# Patient Record
Sex: Female | Born: 1940
Health system: Southern US, Community
[De-identification: ages and names within clinical notes are randomized; demographics above are authoritative.]

## PROBLEM LIST (undated history)

## (undated) DIAGNOSIS — J45909 Unspecified asthma, uncomplicated: Secondary | ICD-10-CM

## (undated) DIAGNOSIS — T4145XA Adverse effect of unspecified anesthetic, initial encounter: Secondary | ICD-10-CM

## (undated) DIAGNOSIS — T8859XA Other complications of anesthesia, initial encounter: Secondary | ICD-10-CM

## (undated) DIAGNOSIS — E669 Obesity, unspecified: Secondary | ICD-10-CM

## (undated) DIAGNOSIS — M1990 Unspecified osteoarthritis, unspecified site: Secondary | ICD-10-CM

## (undated) DIAGNOSIS — T7840XA Allergy, unspecified, initial encounter: Secondary | ICD-10-CM

## (undated) DIAGNOSIS — E785 Hyperlipidemia, unspecified: Secondary | ICD-10-CM

## (undated) DIAGNOSIS — C801 Malignant (primary) neoplasm, unspecified: Secondary | ICD-10-CM

## (undated) DIAGNOSIS — I10 Essential (primary) hypertension: Secondary | ICD-10-CM

## (undated) DIAGNOSIS — R519 Headache, unspecified: Secondary | ICD-10-CM

## (undated) DIAGNOSIS — E049 Nontoxic goiter, unspecified: Secondary | ICD-10-CM

## (undated) DIAGNOSIS — M8580 Other specified disorders of bone density and structure, unspecified site: Secondary | ICD-10-CM

## (undated) HISTORY — PX: WISDOM TOOTH EXTRACTION: SHX21

## (undated) HISTORY — DX: Hyperlipidemia, unspecified: E78.5

## (undated) HISTORY — DX: Obesity, unspecified: E66.9

## (undated) HISTORY — PX: COLONOSCOPY: SHX174

## (undated) HISTORY — PX: EYE SURGERY: SHX253

## (undated) HISTORY — DX: Unspecified asthma, uncomplicated: J45.909

## (undated) HISTORY — DX: Malignant (primary) neoplasm, unspecified: C80.1

## (undated) HISTORY — DX: Other specified disorders of bone density and structure, unspecified site: M85.80

## (undated) HISTORY — PX: DENTAL SURGERY: SHX609

## (undated) HISTORY — DX: Allergy, unspecified, initial encounter: T78.40XA

## (undated) HISTORY — DX: Essential (primary) hypertension: I10

## (undated) HISTORY — PX: ABDOMINAL HYSTERECTOMY: SHX81

---
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
## 2000-08-11 ENCOUNTER — Encounter: Admission: RE | Admit: 2000-08-11 | Discharge: 2000-08-11 | Payer: Self-pay | Admitting: Internal Medicine

## 2000-08-11 ENCOUNTER — Encounter: Payer: Self-pay | Admitting: Internal Medicine

## 2000-09-15 ENCOUNTER — Encounter: Payer: Self-pay | Admitting: Internal Medicine

## 2000-09-15 ENCOUNTER — Encounter: Admission: RE | Admit: 2000-09-15 | Discharge: 2000-09-15 | Payer: Self-pay | Admitting: Internal Medicine

## 2001-11-29 HISTORY — PX: HAMMER TOE SURGERY: SHX385

## 2004-05-15 ENCOUNTER — Ambulatory Visit (HOSPITAL_COMMUNITY): Admission: RE | Admit: 2004-05-15 | Discharge: 2004-05-15 | Payer: Self-pay | Admitting: Gastroenterology

## 2008-12-12 ENCOUNTER — Ambulatory Visit: Payer: Self-pay | Admitting: Internal Medicine

## 2009-01-24 ENCOUNTER — Ambulatory Visit: Payer: Self-pay | Admitting: Internal Medicine

## 2009-03-06 ENCOUNTER — Ambulatory Visit: Payer: Self-pay | Admitting: Internal Medicine

## 2009-07-04 ENCOUNTER — Ambulatory Visit: Payer: Self-pay | Admitting: Internal Medicine

## 2009-12-15 ENCOUNTER — Ambulatory Visit: Payer: Self-pay | Admitting: Internal Medicine

## 2010-02-16 ENCOUNTER — Ambulatory Visit: Payer: Self-pay | Admitting: Internal Medicine

## 2010-07-12 ENCOUNTER — Ambulatory Visit: Payer: Self-pay | Admitting: Internal Medicine

## 2010-07-12 LAB — HM MAMMOGRAPHY

## 2010-10-08 LAB — HM PAP SMEAR

## 2011-01-15 ENCOUNTER — Ambulatory Visit
Admission: RE | Admit: 2011-01-15 | Discharge: 2011-01-15 | Payer: Self-pay | Source: Home / Self Care | Attending: Internal Medicine | Admitting: Internal Medicine

## 2011-08-26 ENCOUNTER — Encounter: Payer: Self-pay | Admitting: Internal Medicine

## 2011-08-26 ENCOUNTER — Ambulatory Visit (INDEPENDENT_AMBULATORY_CARE_PROVIDER_SITE_OTHER): Payer: Medicare Other | Admitting: Internal Medicine

## 2011-08-26 VITALS — BP 152/80 | HR 100 | Temp 98.2°F | Ht 63.5 in | Wt 186.0 lb

## 2011-08-26 DIAGNOSIS — I10 Essential (primary) hypertension: Secondary | ICD-10-CM

## 2011-08-26 DIAGNOSIS — Z0000 Encounter for general adult medical examination without abnormal findings: Secondary | ICD-10-CM

## 2011-08-26 DIAGNOSIS — J069 Acute upper respiratory infection, unspecified: Secondary | ICD-10-CM

## 2011-08-26 DIAGNOSIS — E049 Nontoxic goiter, unspecified: Secondary | ICD-10-CM

## 2011-08-26 DIAGNOSIS — N390 Urinary tract infection, site not specified: Secondary | ICD-10-CM

## 2011-08-26 DIAGNOSIS — R42 Dizziness and giddiness: Secondary | ICD-10-CM

## 2011-08-26 LAB — POCT URINALYSIS DIPSTICK
Bilirubin, UA: NEGATIVE
Blood, UA: NEGATIVE
Glucose, UA: NEGATIVE
Ketones, UA: NEGATIVE
Leukocytes, UA: NEGATIVE
Nitrite, UA: NEGATIVE
Protein, UA: NEGATIVE
Spec Grav, UA: 1.01
Urobilinogen, UA: NEGATIVE
pH, UA: 5

## 2011-08-26 NOTE — Progress Notes (Signed)
  Subjective:    [name], female    DOB: May 08, 1941, 70 y.o.   MRN: [id number]  HPI patient originally scheduled to come in tomorrow for six-month recheck. However she came back from [place] by trying early yesterday morning and has developed a respiratory infection. Has postnasal drip and some dizziness. Did not take antihypertensive medication today and blood pressure is elevated at 152/80. Feels a bit nauseated. She stated [place] several weeks. Went to [facility] in [place],  on July 26. She was found to have a urinary tract infection and was treated with Cipro. Apparently was a bit tachycardic and they were concerned about possible pulmonary embolism. She had a CT of the chest because of an elevated D- dimer but CT was negative for pulmonary embolism. However she was noted to have an enlarged thyroid gland. She's here about all these problems today. Obtain urine specimen today which  is within normal limits with no evidence of infection.    Review of Systems     Objective:   Physical Exam HEENT exam: Pharynx slightly injected. TMs obscured by cerumen. Anterior cervical nodes bilaterally. Neck is supple. Slight thyromegaly. Chest clear.        Assessment & Plan:  Hypertension  Upper respiratory infection  Recent urinary tract infection-July 26  Vertigo  Goiter  Plan: Patient will have a thyroid ultrasound to assess possible enlarged thyroid gland. Have treated URI with Zithromax Z-PAK take as directed. Vertigo to be treated with Antivert over-the-counter 3 times a day as needed. See patient again in 2 weeks to discuss thyroid ultrasound.

## 2011-08-27 ENCOUNTER — Ambulatory Visit: Payer: Self-pay | Admitting: Internal Medicine

## 2011-08-28 ENCOUNTER — Other Ambulatory Visit: Payer: Self-pay

## 2011-09-04 ENCOUNTER — Encounter: Payer: Self-pay | Admitting: Internal Medicine

## 2011-09-05 ENCOUNTER — Ambulatory Visit
Admission: RE | Admit: 2011-09-05 | Discharge: 2011-09-05 | Disposition: A | Discharge status: Home / Self Care | Payer: Medicare Other | Source: Ambulatory Visit | Attending: Internal Medicine | Admitting: Internal Medicine

## 2011-09-05 DIAGNOSIS — E049 Nontoxic goiter, unspecified: Secondary | ICD-10-CM

## 2011-09-09 ENCOUNTER — Encounter: Payer: Self-pay | Admitting: Internal Medicine

## 2011-09-09 ENCOUNTER — Ambulatory Visit (INDEPENDENT_AMBULATORY_CARE_PROVIDER_SITE_OTHER): Payer: Medicare Other | Admitting: Internal Medicine

## 2011-09-09 ENCOUNTER — Telehealth: Payer: Self-pay | Admitting: *Deleted

## 2011-09-09 DIAGNOSIS — J309 Allergic rhinitis, unspecified: Secondary | ICD-10-CM

## 2011-09-09 DIAGNOSIS — E041 Nontoxic single thyroid nodule: Secondary | ICD-10-CM

## 2011-09-09 DIAGNOSIS — E049 Nontoxic goiter, unspecified: Secondary | ICD-10-CM

## 2011-09-09 DIAGNOSIS — E669 Obesity, unspecified: Secondary | ICD-10-CM

## 2011-09-09 DIAGNOSIS — Z8541 Personal history of malignant neoplasm of cervix uteri: Secondary | ICD-10-CM

## 2011-09-09 DIAGNOSIS — E010 Iodine-deficiency related diffuse (endemic) goiter: Secondary | ICD-10-CM

## 2011-09-09 NOTE — Telephone Encounter (Signed)
Pt scheduled on 9/25 at 4:15.  Pt notified.

## 2011-09-24 ENCOUNTER — Other Ambulatory Visit (HOSPITAL_COMMUNITY)
Admission: RE | Admit: 2011-09-24 | Discharge: 2011-09-24 | Disposition: A | Discharge status: Home / Self Care | Payer: Medicare Other | Source: Ambulatory Visit | Attending: Interventional Radiology | Admitting: Interventional Radiology

## 2011-09-24 ENCOUNTER — Ambulatory Visit
Admission: RE | Admit: 2011-09-24 | Discharge: 2011-09-24 | Disposition: A | Discharge status: Home / Self Care | Payer: Medicare Other | Source: Ambulatory Visit | Attending: Internal Medicine | Admitting: Internal Medicine

## 2011-09-24 DIAGNOSIS — E049 Nontoxic goiter, unspecified: Secondary | ICD-10-CM | POA: Insufficient documentation

## 2011-09-24 DIAGNOSIS — E041 Nontoxic single thyroid nodule: Secondary | ICD-10-CM

## 2011-09-25 DIAGNOSIS — E010 Iodine-deficiency related diffuse (endemic) goiter: Secondary | ICD-10-CM | POA: Insufficient documentation

## 2011-09-25 DIAGNOSIS — E041 Nontoxic single thyroid nodule: Secondary | ICD-10-CM | POA: Insufficient documentation

## 2011-09-25 DIAGNOSIS — E669 Obesity, unspecified: Secondary | ICD-10-CM | POA: Insufficient documentation

## 2011-09-25 DIAGNOSIS — J309 Allergic rhinitis, unspecified: Secondary | ICD-10-CM | POA: Insufficient documentation

## 2011-09-25 DIAGNOSIS — Z8541 Personal history of malignant neoplasm of cervix uteri: Secondary | ICD-10-CM | POA: Insufficient documentation

## 2011-09-25 NOTE — Progress Notes (Signed)
  Subjective:    [name], female    DOB: September 27, 1941, 70 y.o.   MRN: [id number]  HPI patient was visiting her mother in [place] and had some chest discomfort and went to the emergency room. She had a CT scan of the chest to rule out pulmonary embolus. Pulmonary a bolus was not found but she was found to have an enlarged thyroid gland with? Nodule on the left. She is here now for further evaluation. Mother is not doing well and she's very anxious about her state of health. Mother has some dementia. Patient's husband also has dementia and she has a hard time caring for him as well. Patient is a retired Runner, broadcasting/film/video with history of hypertension, obesity, allergic rhinitis, osteopenia, vitamin D deficiency, hyperlipidemia. Had hysterectomy without oophorectomy for cervical cancer and counseling in 1977. Right hammertoe surgery December 2002. Colonoscopy 2005.    Review of Systems     Objective:   Physical Exam patient has thyromegaly; neck supple; chest clear.        Assessment & Plan:  Recent ultrasound showed significant nodule left thyroid occupy much of the left lobe of the thyroid.  Bilateral thyromegaly with normal TSH  Patient is to have biopsy of thyroid nodule in the near future. She wants to wait until late September to do this

## 2011-09-26 ENCOUNTER — Telehealth: Payer: Self-pay

## 2011-09-26 NOTE — Telephone Encounter (Signed)
Patient informed that her thyroid needle biopsy was within normal limits.Negative for cancer. Will refer her to endocrinologist for evaluation

## 2011-09-27 ENCOUNTER — Encounter: Payer: Self-pay | Admitting: Internal Medicine

## 2011-09-27 NOTE — Progress Notes (Signed)
Patient informed of thyroid biopsy results. Per Dr. Lenord Fellers, will refer to an endocrinologist

## 2011-10-02 IMAGING — US US SOFT TISSUE HEAD/NECK
1 series · 14 of 25 positions shown · non-contrast
Comparison: None.

CLINICAL DATA: Enlarged thyroid on physical exam

THYROID ULTRASOUND
TECHNIQUE: Ultrasound examination of the thyroid gland and adjacent
soft tissues was performed.

[Series 1: us soft tissue head/neck · 0.06mm/px · 14 of 37 slices shown]
[im 1/37]
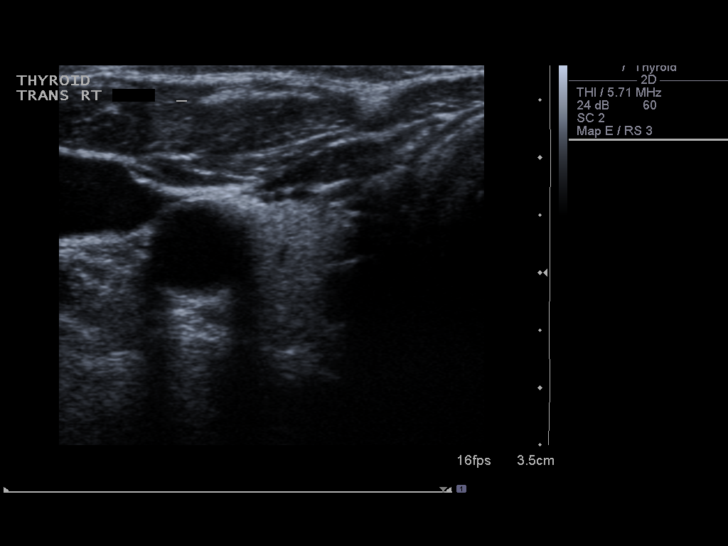
[im 4/37]
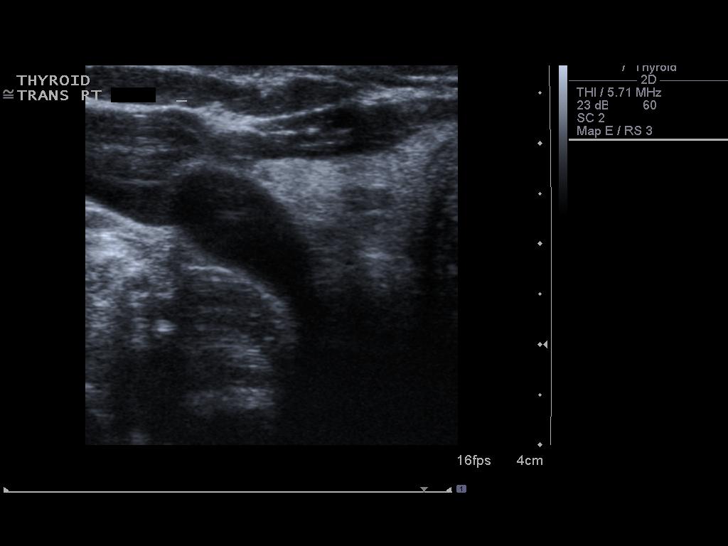
[im 7/37]
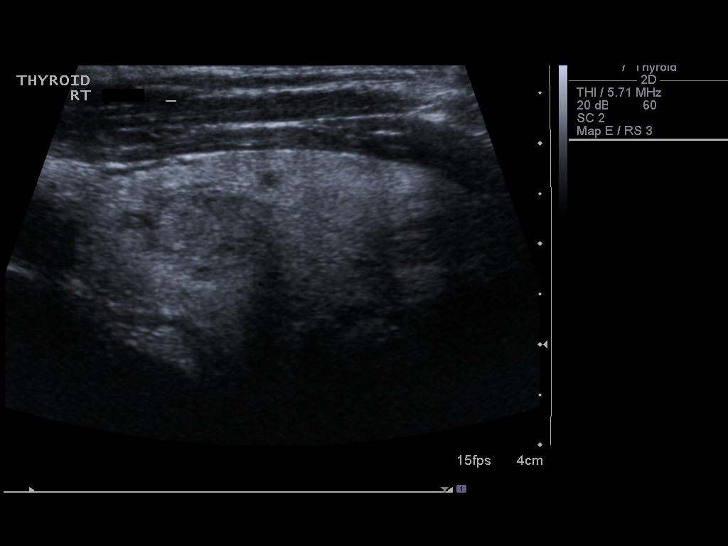
[im 10/37]
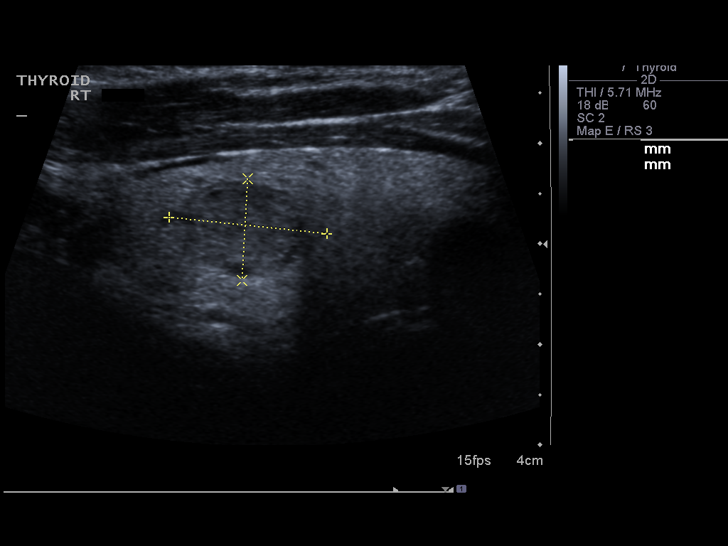
[im 13/37]
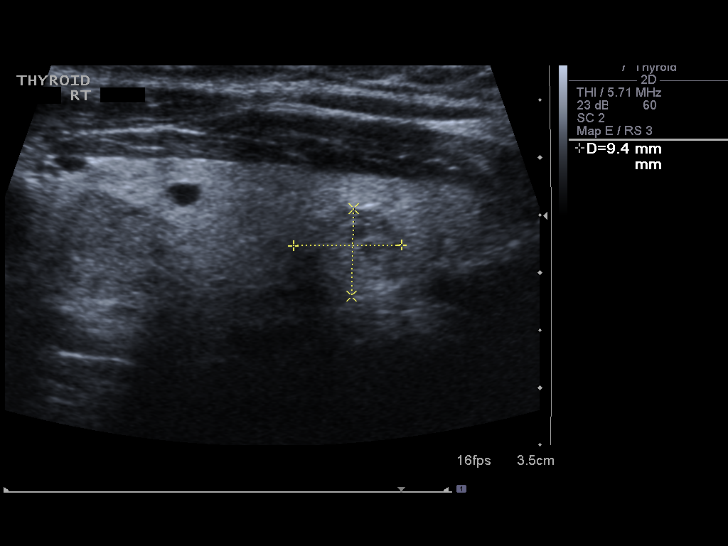
[im 14/37]
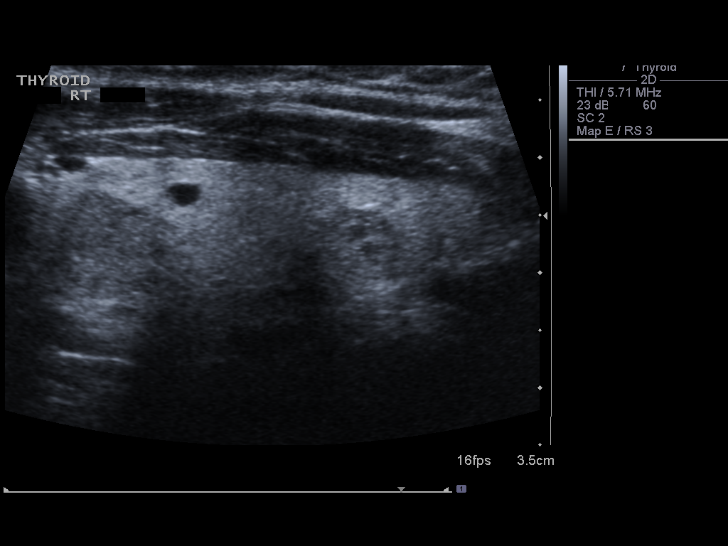
[im 17/37]
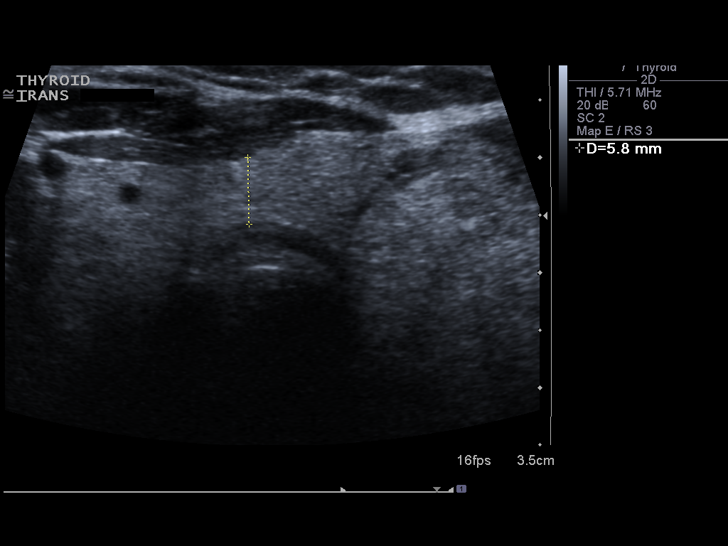
[im 20/37]
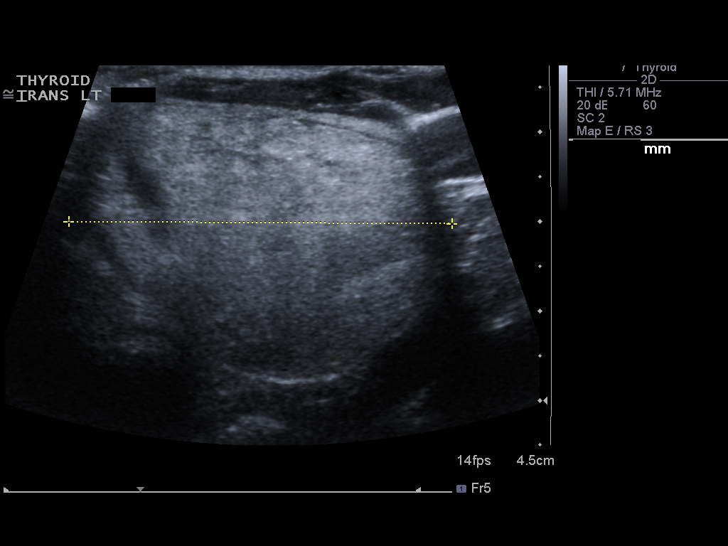
[im 23/37]
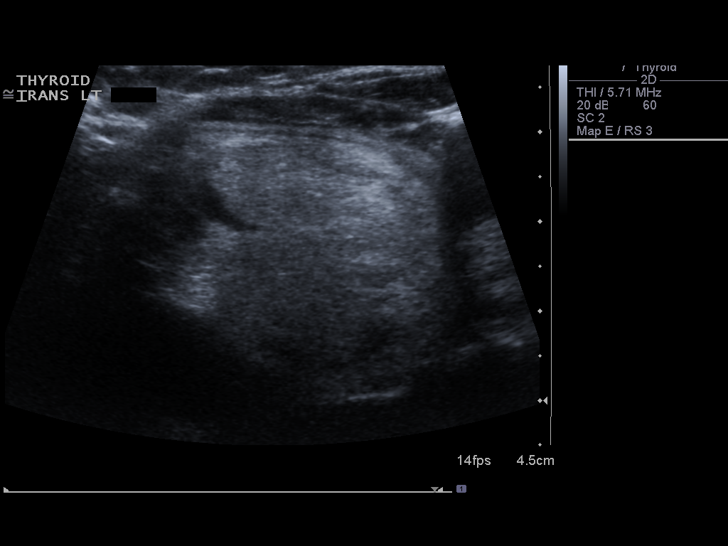
[im 25/37]
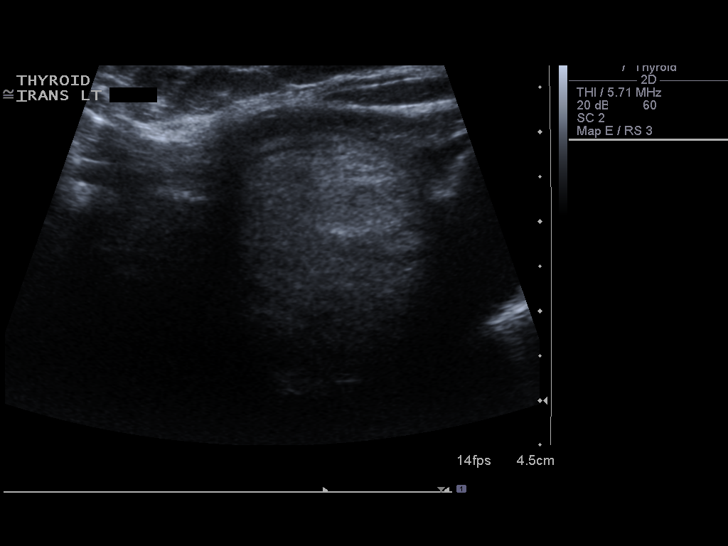
[im 28/37]
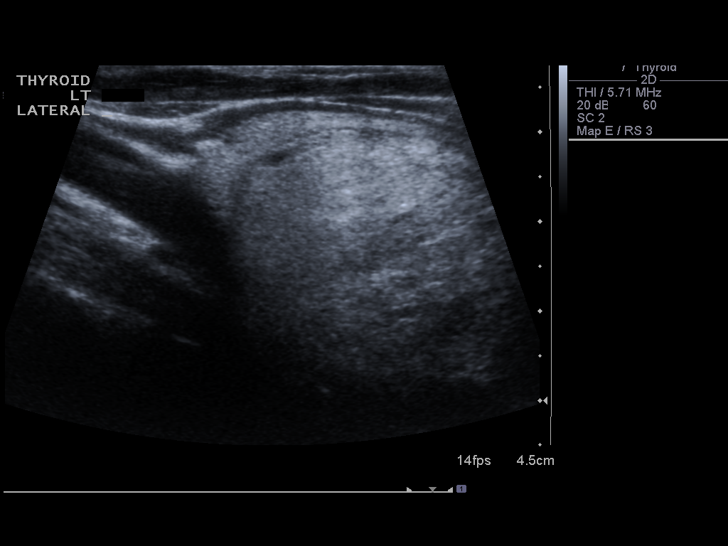
[im 31/37]
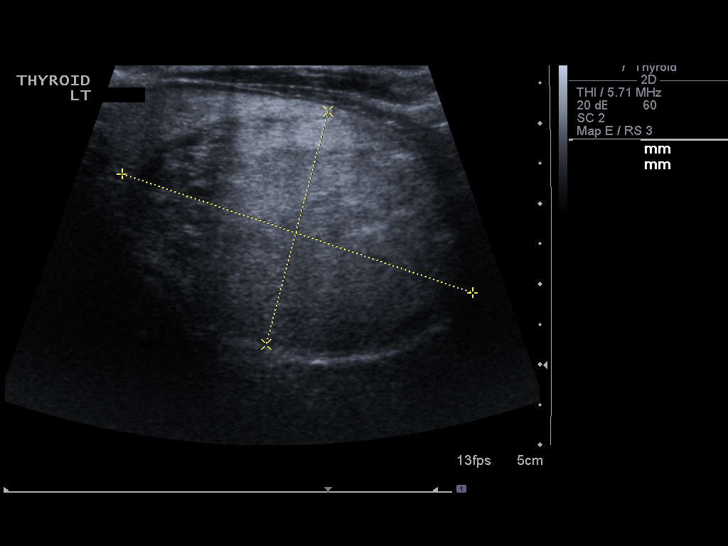
[im 34/37]
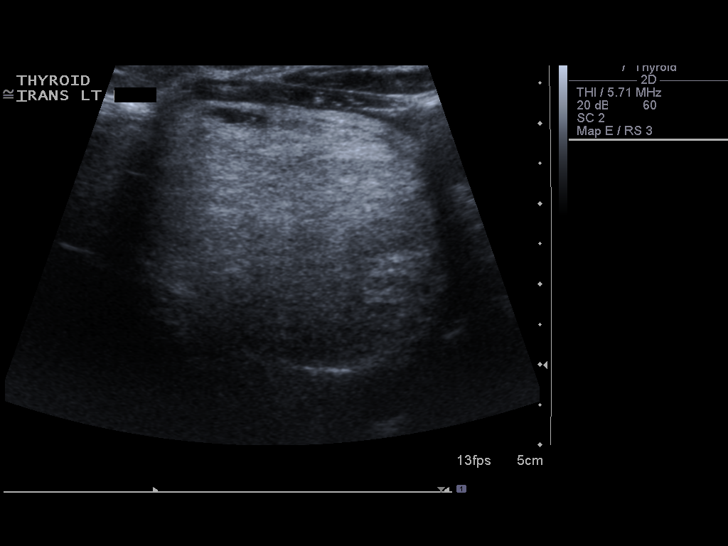
[im 37/37]
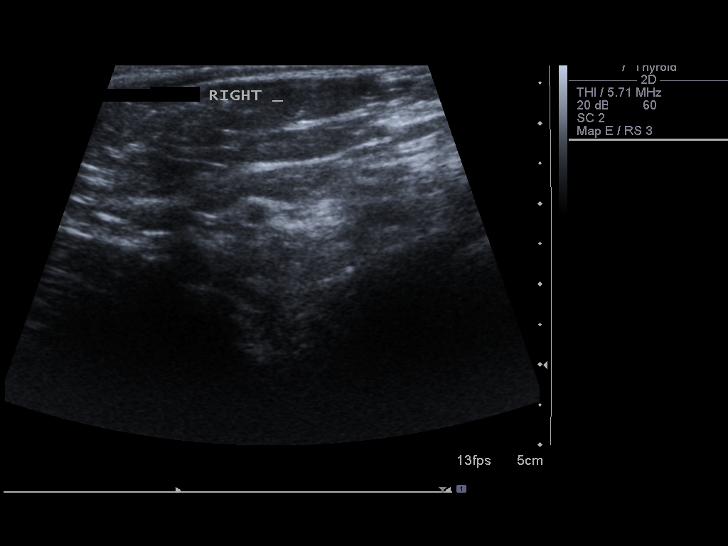

[14 of 25 positions shown; findings below may reference images not displayed]

FINDINGS: Right thyroid lobe:  4.6 x 2.0 x 1.7 cm.
Left thyroid lobe:  6.2 x 3.6 x 4.3 cm.
Isthmus:  6 mm in thickness.

Focal nodules:  The echogenicity of the thyroid gland is somewhat
inhomogeneous.  A dominant solid nodule occupies much of the left
lobe measuring 4.6 x 3.0 x 3.6 cm.  Two solid nodules are noted on
the right, the larger measuring 1.6 x 1.0 x 0.8 cm, with the
smaller in the lower pole measuring 0.9 x 0.8 x 1.0 cm.

Lymphadenopathy:  None visualized.
IMPRESSION: There are thyroid nodules present bilaterally, with the dominant
solid nodule occupying much of the left lobe measuring 4.6 x 3.0 x
3.6 cm.  Consider biopsy of this dominant nodule.

## 2012-01-29 DIAGNOSIS — Q828 Other specified congenital malformations of skin: Secondary | ICD-10-CM | POA: Diagnosis not present

## 2012-01-29 DIAGNOSIS — B351 Tinea unguium: Secondary | ICD-10-CM | POA: Diagnosis not present

## 2012-01-29 DIAGNOSIS — M79609 Pain in unspecified limb: Secondary | ICD-10-CM | POA: Diagnosis not present

## 2012-02-10 ENCOUNTER — Telehealth: Payer: Self-pay | Admitting: Internal Medicine

## 2012-02-10 NOTE — Telephone Encounter (Signed)
[facility] is the most likely choice. Dr. Merlene Laughter with [facility] is a geriatrician but he may not be seeing new patients.

## 2012-02-10 NOTE — Telephone Encounter (Signed)
Left message on patient's home phone advising her to try [facility] or Dr. Merlene Laughter with [facility]

## 2012-02-19 DIAGNOSIS — D235 Other benign neoplasm of skin of trunk: Secondary | ICD-10-CM | POA: Diagnosis not present

## 2012-02-19 DIAGNOSIS — L820 Inflamed seborrheic keratosis: Secondary | ICD-10-CM | POA: Diagnosis not present

## 2012-02-19 DIAGNOSIS — L819 Disorder of pigmentation, unspecified: Secondary | ICD-10-CM | POA: Diagnosis not present

## 2012-03-23 ENCOUNTER — Other Ambulatory Visit: Payer: Self-pay

## 2012-03-23 MED ORDER — ALPRAZOLAM 0.5 MG PO TABS: 0.5000 mg | ORAL_TABLET | Freq: Every evening | ORAL | Status: AC | PRN

## 2012-04-03 DIAGNOSIS — Q828 Other specified congenital malformations of skin: Secondary | ICD-10-CM | POA: Diagnosis not present

## 2012-04-03 DIAGNOSIS — M79609 Pain in unspecified limb: Secondary | ICD-10-CM | POA: Diagnosis not present

## 2012-04-03 DIAGNOSIS — B351 Tinea unguium: Secondary | ICD-10-CM | POA: Diagnosis not present

## 2012-04-14 ENCOUNTER — Other Ambulatory Visit: Payer: Self-pay | Admitting: Internal Medicine

## 2012-04-15 DIAGNOSIS — H40029 Open angle with borderline findings, high risk, unspecified eye: Secondary | ICD-10-CM | POA: Diagnosis not present

## 2012-04-15 DIAGNOSIS — H11159 Pinguecula, unspecified eye: Secondary | ICD-10-CM | POA: Diagnosis not present

## 2012-04-15 DIAGNOSIS — H1045 Other chronic allergic conjunctivitis: Secondary | ICD-10-CM | POA: Diagnosis not present

## 2012-04-15 DIAGNOSIS — H04129 Dry eye syndrome of unspecified lacrimal gland: Secondary | ICD-10-CM | POA: Diagnosis not present

## 2012-05-19 DIAGNOSIS — E042 Nontoxic multinodular goiter: Secondary | ICD-10-CM | POA: Diagnosis not present

## 2012-05-21 DIAGNOSIS — I10 Essential (primary) hypertension: Secondary | ICD-10-CM | POA: Diagnosis not present

## 2012-05-21 DIAGNOSIS — E042 Nontoxic multinodular goiter: Secondary | ICD-10-CM | POA: Diagnosis not present

## 2012-06-12 DIAGNOSIS — H2510 Age-related nuclear cataract, unspecified eye: Secondary | ICD-10-CM | POA: Diagnosis not present

## 2012-06-12 DIAGNOSIS — H40039 Anatomical narrow angle, unspecified eye: Secondary | ICD-10-CM | POA: Diagnosis not present

## 2012-06-15 DIAGNOSIS — L84 Corns and callosities: Secondary | ICD-10-CM | POA: Diagnosis not present

## 2012-06-15 DIAGNOSIS — B351 Tinea unguium: Secondary | ICD-10-CM | POA: Diagnosis not present

## 2012-06-15 DIAGNOSIS — M7750 Other enthesopathy of unspecified foot: Secondary | ICD-10-CM | POA: Diagnosis not present

## 2012-06-15 DIAGNOSIS — M79609 Pain in unspecified limb: Secondary | ICD-10-CM | POA: Diagnosis not present

## 2012-06-16 DIAGNOSIS — H40039 Anatomical narrow angle, unspecified eye: Secondary | ICD-10-CM | POA: Diagnosis not present

## 2012-06-18 DIAGNOSIS — H40039 Anatomical narrow angle, unspecified eye: Secondary | ICD-10-CM | POA: Diagnosis not present

## 2012-07-15 DIAGNOSIS — H40029 Open angle with borderline findings, high risk, unspecified eye: Secondary | ICD-10-CM | POA: Diagnosis not present

## 2012-07-23 ENCOUNTER — Encounter: Payer: Self-pay | Admitting: Internal Medicine

## 2012-07-23 ENCOUNTER — Other Ambulatory Visit (INDEPENDENT_AMBULATORY_CARE_PROVIDER_SITE_OTHER): Payer: Medicare Other | Admitting: Internal Medicine

## 2012-07-23 ENCOUNTER — Ambulatory Visit (INDEPENDENT_AMBULATORY_CARE_PROVIDER_SITE_OTHER): Payer: Medicare Other | Admitting: Internal Medicine

## 2012-07-23 ENCOUNTER — Other Ambulatory Visit: Payer: Self-pay | Admitting: Internal Medicine

## 2012-07-23 VITALS — BP 126/74 | HR 100 | Temp 98.4°F | Ht 62.5 in | Wt 191.0 lb

## 2012-07-23 DIAGNOSIS — Z8669 Personal history of other diseases of the nervous system and sense organs: Secondary | ICD-10-CM | POA: Diagnosis not present

## 2012-07-23 DIAGNOSIS — I10 Essential (primary) hypertension: Secondary | ICD-10-CM

## 2012-07-23 DIAGNOSIS — E041 Nontoxic single thyroid nodule: Secondary | ICD-10-CM | POA: Diagnosis not present

## 2012-07-23 DIAGNOSIS — E785 Hyperlipidemia, unspecified: Secondary | ICD-10-CM | POA: Diagnosis not present

## 2012-07-23 DIAGNOSIS — D649 Anemia, unspecified: Secondary | ICD-10-CM | POA: Diagnosis not present

## 2012-07-23 DIAGNOSIS — D7589 Other specified diseases of blood and blood-forming organs: Secondary | ICD-10-CM | POA: Diagnosis not present

## 2012-07-23 DIAGNOSIS — E559 Vitamin D deficiency, unspecified: Secondary | ICD-10-CM

## 2012-07-23 DIAGNOSIS — E639 Nutritional deficiency, unspecified: Secondary | ICD-10-CM

## 2012-07-23 LAB — LIPID PANEL
Cholesterol: 221 mg/dL — ABNORMAL HIGH (ref 0–200)
HDL: 66 mg/dL (ref >39)
Total CHOL/HDL Ratio: 3.3 Ratio
VLDL: 13 mg/dL (ref 0–40)

## 2012-07-23 LAB — POCT URINALYSIS DIPSTICK
Protein, UA: NEGATIVE
Spec Grav, UA: 1.015
Urobilinogen, UA: NEGATIVE
pH, UA: 6

## 2012-07-23 LAB — CBC WITH DIFFERENTIAL/PLATELET
Basophils Absolute: 0 10*3/uL (ref 0.0–0.1)
Basophils Relative: 0 % (ref 0–1)
MCHC: 33 g/dL (ref 30.0–36.0)
Neutro Abs: 3.9 10*3/uL (ref 1.7–7.7)
Neutrophils Relative %: 69 % (ref 43–77)
Platelets: 288 10*3/uL (ref 150–400)
RDW: 16.2 % — ABNORMAL HIGH (ref 11.5–15.5)

## 2012-07-23 LAB — COMPREHENSIVE METABOLIC PANEL
AST: 18 U/L (ref 0–37)
Albumin: 4.4 g/dL (ref 3.5–5.2)
Alkaline Phosphatase: 74 U/L (ref 39–117)
BUN: 17 mg/dL (ref 6–23)
Potassium: 3.5 mEq/L (ref 3.5–5.3)
Sodium: 139 mEq/L (ref 135–145)
Total Protein: 7.5 g/dL (ref 6.0–8.3)

## 2012-07-23 MED ORDER — MAXZIDE 75-50 MG PO TABS: 90.0000 | ORAL_TABLET | Freq: Every day | ORAL | Status: DC

## 2012-07-23 MED ORDER — AMLODIPINE BESYLATE 5 MG PO TABS: 5.0000 mg | ORAL_TABLET | Freq: Every day | ORAL | Status: DC

## 2012-07-23 NOTE — Progress Notes (Signed)
Subjective:    [name], female    DOB: 07-10-1941, 71 y.o.   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 History of hypertension, hyperlipidemia, allergic rhinitis. Has been unable to lose weight and improve her lipids. Husband is in a nursing home. This is very stressful. She recently had surgery while visiting relatives in [place] for open-angle glaucoma. Has musculoskeletal pain in shoulders and neck. Has been on Maxzide 75/50 since 1999 for hypertension. In 2010 Norvasc 5 mg daily was added. History of anxiety depression in 1998. Seems mildly depressed female. Had colonoscopy in 2005 by Dr. Randa Evens. Bone density study in 2008. [facility] doctor to [place] now doing GYN exams. Had hysterectomy without oophorectomy for cervical cancer in 1977. Right hammertoe surgery December 2002. Gets annual mammogram.  Says penicillin causes chest pain, intolerant of Darvon and codeine. History of vitamin D deficiency.  Patient has 2 Masters degrees. Husband is a retired Tourist information centre manager that has developed dementia. No children.  Family history: Father with history of diabetes and hypertension now deceased  Mother with history of hypertension and dementia one sister Subjective:   Patient presents for Medicare Annual/Subsequent preventive examination. Review Past Medical/Family/Social:   Risk Factors  Current exercise habits: None Dietary issues discussed: Weight discussed and stress with husband in nursing home.  Cardiac risk factors: HTN, Obesity, Hyperlipidemia  Depression Screen  (Note: if answer to either of the following is "Yes", a more complete depression screening is indicated)   Over the past two weeks, have you felt down, depressed or hopeless? yes Over the past two weeks, have you felt little interest or pleasure in doing things? yes Have you lost interest or pleasure in daily  life? yes Do you often feel hopeless? No Do you cry easily over simple problems? no Activities of Daily Living  In your present state of health, do you have any difficulty performing the following activities?: stressed  Driving? No  Managing money? No  Feeding yourself? No  Getting from bed to chair? No  Climbing a flight of stairs? No  Preparing food and eating?: No  Bathing or showering? No  Getting dressed: No  Getting to the toilet? No  Using the toilet:No  Moving around from place to place: No  In the past year have you fallen or had a near fall?:No  Are you sexually active? No  Do you have more than one partner? No   Hearing Difficulties: No  Do you often ask people to speak up or repeat themselves? No  Do you experience ringing or noises in your ears? No  Do you have difficulty understanding soft or whispered voices? No  Do you feel that you have a problem with memory? No Do you often misplace items? No    Home Safety:  Do you have a smoke alarm at your residence? Yes Do you have grab bars in the bathroom? yes Do you have throw rugs in your house? yes   Cognitive Testing  Alert? Yes Normal Appearance?Yes  Oriented to person? Yes Place? Yes  Time? Yes  Recall of three objects? Yes  Can perform simple calculations? Yes  Displays appropriate judgment?Yes  Can read the correct time from a watch face?Yes   List the Names of Other Physician/Practitioners you currently use:  See referral list for the physicians patient is currently seeing. Dr. Lucianne Muss, Dr. Elmer Picker, Dr. Billy Coast    Review of Systems: exercise  discussed wants to back to [facility]. Last went 2012.   Objective:     General appearance: Appears stated age and mildly obese  Head: Normocephalic, without obvious abnormality, atraumatic  Eyes: conj clear, EOMi PEERLA  Ears: normal TM's and external ear canals both ears  Nose: Nares normal. Septum midline. Mucosa normal. No drainage or sinus  tenderness.  Throat: lips, mucosa, and tongue normal; teeth and gums normal  Neck: no adenopathy, no carotid bruit, no JVD, supple, symmetrical, trachea midline and thyroid not enlarged, symmetric, no tenderness/mass/nodules  No CVA tenderness.  Lungs: clear to auscultation bilaterally  Breasts: normal appearance, no masses or tenderness,  Heart: regular rate and rhythm, S1, S2 normal, no murmur, click, rub or gallop  Abdomen: soft, non-tender; bowel sounds normal; no masses, no organomegaly  Musculoskeletal: ROM normal in all joints, no crepitus, no deformity, Normal muscle strengthen. Back  is symmetric, no curvature. Skin: Skin color, texture, turgor normal. No rashes or lesions  Lymph nodes: Cervical, supraclavicular, and axillary nodes normal.  Neurologic: CN 2 -12 Normal, Normal symmetric reflexes. Normal coordination and gait  Psych: Alert & Oriented x 3, Mood appear stable.    Assessment:    Annual wellness medicare exam   Plan:    During the course of the visit the patient was educated and counseled about appropriate screening and preventive services including:   Diet and exercise, annual mammogram     Patient Instructions (the written plan) was given to the patient.  Medicare Attestation  I have personally reviewed:  The patient's medical and social history  Their use of alcohol, tobacco or illicit drugs  Their current medications and supplements  The patient's functional ability including ADLs,fall risks, home safety risks, cognitive, and hearing and visual impairment  Diet and physical activities  Evidence for depression or mood disorders  The patient's weight, height, BMI, and visual acuity have been recorded in the chart. I have made referrals, counseling, and provided education to the patient based on review of the above and I have provided the patient with a written personalized care plan for preventive services.        Review of Systems  HENT:        Goiter and large thyroid nodule  Musculoskeletal:       Arthritis of knee  Psychiatric/Behavioral:       Anxiety       Objective:   Physical Exam  Vitals reviewed. Constitutional: She is oriented to person, place, and time. She appears well-developed and well-nourished. No distress.  HENT:  Head: Normocephalic and atraumatic.  Right Ear: External ear normal.  Left Ear: External ear normal.  Mouth/Throat: Oropharynx is clear and moist. No oropharyngeal exudate.  Eyes: Conjunctivae and EOM are normal. Pupils are equal, round, and reactive to light. Right eye exhibits no discharge. Left eye exhibits no discharge. No scleral icterus.  Neck: Neck supple. No JVD present. No thyromegaly present.  Cardiovascular: Normal rate, regular rhythm, normal heart sounds and intact distal pulses.   No murmur heard. Pulmonary/Chest: Effort normal and breath sounds normal. No respiratory distress. She has no wheezes. She has no rales. She exhibits no tenderness.       Breasts normal female-pendulous  Abdominal: Soft. Bowel sounds are normal. There is no rebound.  Genitourinary:       Deferred to GYN  Musculoskeletal: She exhibits no edema.       Osteoarthritis right knee  Lymphadenopathy:    She has no cervical adenopathy.  Neurological: She is alert and oriented to person, place, and time. She has normal reflexes. She displays normal reflexes. No cranial nerve deficit. Coordination normal.  Skin: Skin is warm and dry. No rash noted. She is not diaphoretic.  Psychiatric: She has a normal mood and affect. Her behavior is normal. Judgment and thought content normal.          Assessment & Plan:  HTN Obesity Goiter and large thyroid nodule Hyperlipidemia Osteoarthritis Hx surgery recently for closed angle glaucoma in [place]  Mild depression  Plan: Return in 6 months. Continue same medications. Encouraged diet exercise and weight loss.

## 2012-07-23 NOTE — Patient Instructions (Addendum)
Continue same medications for hypertension. Diet, exercise, and lose weight.

## 2012-07-27 LAB — IRON AND TIBC: Iron: 71 ug/dL (ref 42–145)

## 2012-08-11 DIAGNOSIS — B351 Tinea unguium: Secondary | ICD-10-CM | POA: Diagnosis not present

## 2012-08-11 DIAGNOSIS — M79609 Pain in unspecified limb: Secondary | ICD-10-CM | POA: Diagnosis not present

## 2012-08-11 DIAGNOSIS — M2040 Other hammer toe(s) (acquired), unspecified foot: Secondary | ICD-10-CM | POA: Diagnosis not present

## 2012-09-10 DIAGNOSIS — Z1231 Encounter for screening mammogram for malignant neoplasm of breast: Secondary | ICD-10-CM | POA: Diagnosis not present

## 2012-10-21 DIAGNOSIS — L819 Disorder of pigmentation, unspecified: Secondary | ICD-10-CM | POA: Diagnosis not present

## 2012-10-21 DIAGNOSIS — B353 Tinea pedis: Secondary | ICD-10-CM | POA: Diagnosis not present

## 2012-10-21 DIAGNOSIS — D235 Other benign neoplasm of skin of trunk: Secondary | ICD-10-CM | POA: Diagnosis not present

## 2012-11-06 DIAGNOSIS — Z23 Encounter for immunization: Secondary | ICD-10-CM | POA: Diagnosis not present

## 2012-11-11 DIAGNOSIS — H1045 Other chronic allergic conjunctivitis: Secondary | ICD-10-CM | POA: Diagnosis not present

## 2012-11-11 DIAGNOSIS — H40029 Open angle with borderline findings, high risk, unspecified eye: Secondary | ICD-10-CM | POA: Diagnosis not present

## 2012-11-11 DIAGNOSIS — H2510 Age-related nuclear cataract, unspecified eye: Secondary | ICD-10-CM | POA: Diagnosis not present

## 2012-11-11 DIAGNOSIS — H40039 Anatomical narrow angle, unspecified eye: Secondary | ICD-10-CM | POA: Diagnosis not present

## 2012-11-17 DIAGNOSIS — B351 Tinea unguium: Secondary | ICD-10-CM | POA: Diagnosis not present

## 2012-11-17 DIAGNOSIS — M79609 Pain in unspecified limb: Secondary | ICD-10-CM | POA: Diagnosis not present

## 2012-11-17 DIAGNOSIS — Q828 Other specified congenital malformations of skin: Secondary | ICD-10-CM | POA: Diagnosis not present

## 2012-11-19 DIAGNOSIS — Z1211 Encounter for screening for malignant neoplasm of colon: Secondary | ICD-10-CM | POA: Diagnosis not present

## 2012-11-19 DIAGNOSIS — D069 Carcinoma in situ of cervix, unspecified: Secondary | ICD-10-CM | POA: Diagnosis not present

## 2012-11-19 DIAGNOSIS — Z01419 Encounter for gynecological examination (general) (routine) without abnormal findings: Secondary | ICD-10-CM | POA: Diagnosis not present

## 2012-11-19 DIAGNOSIS — Z124 Encounter for screening for malignant neoplasm of cervix: Secondary | ICD-10-CM | POA: Diagnosis not present

## 2012-11-23 ENCOUNTER — Telehealth: Payer: Self-pay | Admitting: Internal Medicine

## 2012-11-23 NOTE — Telephone Encounter (Signed)
Pt had colonoscopy 2005 per Dr. Randa Evens. GYN reports positive hemoccult card so pt should see Dr. Randa Evens again.

## 2012-11-24 ENCOUNTER — Encounter: Payer: Self-pay | Admitting: Internal Medicine

## 2012-11-30 ENCOUNTER — Telehealth: Payer: Self-pay | Admitting: Internal Medicine

## 2012-11-30 NOTE — Telephone Encounter (Signed)
Book OV to discuss with me please.

## 2012-11-30 NOTE — Telephone Encounter (Signed)
Spoke with patient and gave appointment for 12/03/12 @ 12:00 to discuss ongoing issues with possible fissures and rectal bleeding.  She doesn't currently note any blood in her stool.

## 2012-12-03 ENCOUNTER — Ambulatory Visit (INDEPENDENT_AMBULATORY_CARE_PROVIDER_SITE_OTHER): Payer: Medicare Other | Admitting: Internal Medicine

## 2012-12-03 ENCOUNTER — Encounter: Payer: Self-pay | Admitting: Internal Medicine

## 2012-12-03 VITALS — BP 130/62 | HR 100 | Temp 97.8°F | Wt 192.0 lb

## 2012-12-03 DIAGNOSIS — F411 Generalized anxiety disorder: Secondary | ICD-10-CM

## 2012-12-03 DIAGNOSIS — K602 Anal fissure, unspecified: Secondary | ICD-10-CM

## 2012-12-03 DIAGNOSIS — K648 Other hemorrhoids: Secondary | ICD-10-CM

## 2012-12-03 DIAGNOSIS — I10 Essential (primary) hypertension: Secondary | ICD-10-CM

## 2012-12-03 DIAGNOSIS — R195 Other fecal abnormalities: Secondary | ICD-10-CM | POA: Diagnosis not present

## 2012-12-05 DIAGNOSIS — F419 Anxiety disorder, unspecified: Secondary | ICD-10-CM | POA: Insufficient documentation

## 2012-12-05 NOTE — Patient Instructions (Addendum)
Patient is to do 3 Hemoccult cards off vitamins and return to this office. Followup with Dr. Randa Evens after the first of the year. Use Lotrisone cream and rectal area twice daily. Try not to over clean rectal area which can lead to irritation

## 2012-12-05 NOTE — Progress Notes (Signed)
  Subjective:    [name], female    DOB: 02/04/1941, 71 y.o.   MRN: [id number]  HPI 71 year old black female who went to GYN physician, Dr. Billy Coast, recently and was found to have a positive Hemoccult card on exam at his office and was referred here for further evaluation. She has had colonoscopy by Dr. Carman Ching in the past. She does tend to be rather fastidious  about rectal cleaning. She has had a rectal fissure in the past. When the Hemoccult was done, she had been taking multivitamins including vitamin C so it may actually be a false positive result. Does complain of rectal irritation. Says she is out of cream previously prescribed for rectal irritation several years ago  Was Lotrisone cream to use in perirectal area twice daily by old records. Colonoscopy was done by Dr. Randa Evens 05/15/2004. Study was normal with no polyps or other lesions. 2 year followup was recommended. She also had a sigmoidoscopy in 2001 which was normal.  She has a history of anxiety, hyperlipidemia, vitamin D deficiency, allergic rhinitis and obesity. History of hysterectomy without oophorectomy cancer of the cervix 1977.    Review of Systems     Objective:   Physical Exam abdominal exam: No hepatosplenomegaly masses or tenderness; rectal exam small superficial fissure at 5:00 that is not bleeding with patient in left lateral decubitus position. Stool guaiac is negative. Digital rectal exam reveals no masses. Anoscopy shows very small internal hemorrhoids.        Assessment & Plan:  Small rectal fissure-could be source of heme positive stool  History of Hemoccult positive stool-likely to be false positive with negative screening in 2005  Internal hemorrhoids-small  Perirectal irritation from over cleaning  Plan: Lotrisone cream to use in perirectal area twice daily. Patient is to clean likely with wipes such as wet ones, dry with gauze and keep area dry as possible. Long talk with  patient about over cleaning rectal area which can lead irritation. Hemoccult-positive stool may be false positive based on the fact that was done in the office without any preparation with diet and she was taking vitamin C and multivitamin. 3 Hemoccult cards given today to bring back to the office. She will avoid vitamins and red meat as well as aspirin and NSAIDS before preparing his Hemoccult cards   Over 25 minutes spent with patient explaining etiology of test results, reassuring her and educating her about care of rectal area.

## 2012-12-07 LAB — HEMOCCULT GUIAC POC 1CARD (OFFICE): Fecal Occult Blood, POC: NEGATIVE

## 2012-12-07 NOTE — Addendum Note (Signed)
Addended by: Judy Pimple on: 12/07/2012 12:54 PM   Modules accepted: Orders

## 2012-12-09 ENCOUNTER — Other Ambulatory Visit (INDEPENDENT_AMBULATORY_CARE_PROVIDER_SITE_OTHER): Payer: Medicare Other

## 2012-12-09 DIAGNOSIS — R195 Other fecal abnormalities: Secondary | ICD-10-CM

## 2012-12-09 DIAGNOSIS — Z1211 Encounter for screening for malignant neoplasm of colon: Secondary | ICD-10-CM

## 2012-12-09 LAB — HEMOCCULT GUIAC POC 1CARD (OFFICE)

## 2013-01-11 ENCOUNTER — Ambulatory Visit: Payer: Medicare Other | Admitting: Internal Medicine

## 2013-01-18 ENCOUNTER — Ambulatory Visit: Payer: Medicare Other | Admitting: Internal Medicine

## 2013-01-19 ENCOUNTER — Ambulatory Visit: Payer: Medicare Other | Admitting: Internal Medicine

## 2013-01-26 ENCOUNTER — Ambulatory Visit: Payer: Medicare Other | Admitting: Internal Medicine

## 2013-02-02 ENCOUNTER — Ambulatory Visit: Payer: Medicare Other | Admitting: Internal Medicine

## 2013-02-16 ENCOUNTER — Encounter: Payer: Self-pay | Admitting: Internal Medicine

## 2013-02-16 ENCOUNTER — Other Ambulatory Visit: Payer: Self-pay | Admitting: Internal Medicine

## 2013-02-16 ENCOUNTER — Other Ambulatory Visit: Payer: Medicare Other | Admitting: Internal Medicine

## 2013-02-16 ENCOUNTER — Ambulatory Visit (INDEPENDENT_AMBULATORY_CARE_PROVIDER_SITE_OTHER): Payer: Medicare Other | Admitting: Internal Medicine

## 2013-02-16 VITALS — BP 126/66 | HR 96 | Temp 98.3°F | Wt 191.0 lb

## 2013-02-16 DIAGNOSIS — M1712 Unilateral primary osteoarthritis, left knee: Secondary | ICD-10-CM

## 2013-02-16 DIAGNOSIS — E669 Obesity, unspecified: Secondary | ICD-10-CM

## 2013-02-16 DIAGNOSIS — M1710 Unilateral primary osteoarthritis, unspecified knee: Secondary | ICD-10-CM

## 2013-02-16 DIAGNOSIS — IMO0002 Reserved for concepts with insufficient information to code with codable children: Secondary | ICD-10-CM

## 2013-02-16 DIAGNOSIS — E785 Hyperlipidemia, unspecified: Secondary | ICD-10-CM

## 2013-02-16 DIAGNOSIS — I10 Essential (primary) hypertension: Secondary | ICD-10-CM | POA: Diagnosis not present

## 2013-02-16 DIAGNOSIS — F411 Generalized anxiety disorder: Secondary | ICD-10-CM

## 2013-02-16 LAB — LIPID PANEL
Cholesterol: 208 mg/dL — ABNORMAL HIGH (ref 0–200)
HDL: 66 mg/dL (ref >39)
Total CHOL/HDL Ratio: 3.2 Ratio
Triglycerides: 68 mg/dL (ref <150)
VLDL: 14 mg/dL (ref 0–40)

## 2013-02-16 NOTE — Telephone Encounter (Signed)
Please refill for 6 months 

## 2013-02-16 NOTE — Progress Notes (Signed)
  Subjective:    [name], female    DOB: 01-06-1941, 72 y.o.   MRN: [id number]  HPI For 6 month recheck on HTN. Issues with osteoarthritis of knees especially left knee. Wants to go to [facility] for PT. Order written. Stopped statin therapy prescribed at last visit after about a month. Was concerned it was causing some issues with her knees but I don't think so. Was afraid it might lead to issues with ambulation down the road. Explained that this was not likely. She really didn't give it a chance. She remains under a great deal of stress with elderly mother in [place] who initially was in a nursing home but now is back with patient's sister. Mother hasbeen very mean and verbally abusive. Patient's husband has dementia and is in a skilled nursing facility here in town. She feels like he is getting worse. She's tearful in the office today. She has Xanax on hand for anxiety. She takes it sparingly. Blood pressure is under good control. Lipid panel drawn today. She just started taking fish oil from [facility] but hasn't been on it but just a couple of days. Has some bilateral shoulder pain that she attributes to large breasts. Says she's going to hire a trainer in the spring.     Review of Systems     Objective:   Physical Exam neck supple without JVD thyromegaly or carotid bruits. Chest clear to auscultation. Cardiac exam regular rate and rhythm normal S1 and S2. Left knee: No effusion. Has tenderness medial joint line. No lower extremity edema. Judgment and affect are appropriate but she is tearful in the office today and appropriately grieving for mother and husband. Skin is warm and dry.        Assessment & Plan:  Anxiety  Hypertension  Hyperlipidemia-not taking statin therapy. Recently started facial a couple of days ago  Obesity  Osteoarthritis left knee  Plan: Patient has Xanax on hand to take for anxiety. Is to call pharmacy should  refills be needed. We will review fasting lipid panel and make further recommendations. Order written for physical therapy for osteoarthritis of the knees. Return in 6 months for physical exam.

## 2013-02-16 NOTE — Patient Instructions (Addendum)
Fasting lipid panel drawn not taking statin therapy. Please return in 6 months for physical examination. Recommendations regarding lipids to follow after review of result. Exam especially for anxiety. Order written for physical therapy for knees

## 2013-02-19 DIAGNOSIS — B351 Tinea unguium: Secondary | ICD-10-CM | POA: Diagnosis not present

## 2013-02-19 DIAGNOSIS — Q828 Other specified congenital malformations of skin: Secondary | ICD-10-CM | POA: Diagnosis not present

## 2013-02-19 DIAGNOSIS — M79609 Pain in unspecified limb: Secondary | ICD-10-CM | POA: Diagnosis not present

## 2013-02-23 ENCOUNTER — Telehealth: Payer: Self-pay | Admitting: Internal Medicine

## 2013-02-23 NOTE — Telephone Encounter (Signed)
Re write Prescription of PT for osteoarthritis at [facility] as pt lost Rx

## 2013-05-12 DIAGNOSIS — H40029 Open angle with borderline findings, high risk, unspecified eye: Secondary | ICD-10-CM | POA: Diagnosis not present

## 2013-05-18 DIAGNOSIS — E042 Nontoxic multinodular goiter: Secondary | ICD-10-CM | POA: Diagnosis not present

## 2013-05-19 ENCOUNTER — Other Ambulatory Visit: Payer: Self-pay | Admitting: Internal Medicine

## 2013-05-20 DIAGNOSIS — E042 Nontoxic multinodular goiter: Secondary | ICD-10-CM | POA: Diagnosis not present

## 2013-05-21 ENCOUNTER — Other Ambulatory Visit: Payer: Self-pay | Admitting: Endocrinology

## 2013-05-21 DIAGNOSIS — E049 Nontoxic goiter, unspecified: Secondary | ICD-10-CM

## 2013-05-31 DIAGNOSIS — M79609 Pain in unspecified limb: Secondary | ICD-10-CM | POA: Diagnosis not present

## 2013-05-31 DIAGNOSIS — B351 Tinea unguium: Secondary | ICD-10-CM | POA: Diagnosis not present

## 2013-05-31 DIAGNOSIS — Q828 Other specified congenital malformations of skin: Secondary | ICD-10-CM | POA: Diagnosis not present

## 2013-06-01 ENCOUNTER — Ambulatory Visit
Admission: RE | Admit: 2013-06-01 | Discharge: 2013-06-01 | Disposition: A | Discharge status: Home / Self Care | Payer: Medicare Other | Source: Ambulatory Visit | Attending: Endocrinology | Admitting: Endocrinology

## 2013-06-01 DIAGNOSIS — E049 Nontoxic goiter, unspecified: Secondary | ICD-10-CM

## 2013-06-01 DIAGNOSIS — E041 Nontoxic single thyroid nodule: Secondary | ICD-10-CM | POA: Diagnosis not present

## 2013-07-06 ENCOUNTER — Other Ambulatory Visit: Payer: Self-pay | Admitting: Endocrinology

## 2013-07-21 ENCOUNTER — Other Ambulatory Visit: Payer: Self-pay | Admitting: Endocrinology

## 2013-07-21 ENCOUNTER — Telehealth: Payer: Self-pay | Admitting: *Deleted

## 2013-07-21 DIAGNOSIS — E041 Nontoxic single thyroid nodule: Secondary | ICD-10-CM

## 2013-07-21 NOTE — Telephone Encounter (Signed)
Referral has been made today 

## 2013-07-21 NOTE — Telephone Encounter (Signed)
Lisa [name] called, she said that you were going to schedule a thyroid biopsy with [facility], but she has not heard form them and wants to know if you sent the referral, she also wants to know if she can call them to schedule it herself? CB # [phone]

## 2013-07-22 NOTE — Telephone Encounter (Signed)
Left message on patient voicemail.

## 2013-07-31 ENCOUNTER — Other Ambulatory Visit: Payer: Self-pay | Admitting: Internal Medicine

## 2013-08-09 NOTE — Telephone Encounter (Signed)
Appears to be scheduled on 09/01/13

## 2013-08-12 DIAGNOSIS — M79609 Pain in unspecified limb: Secondary | ICD-10-CM | POA: Diagnosis not present

## 2013-08-12 DIAGNOSIS — M2010 Hallux valgus (acquired), unspecified foot: Secondary | ICD-10-CM | POA: Diagnosis not present

## 2013-08-12 DIAGNOSIS — M7750 Other enthesopathy of unspecified foot: Secondary | ICD-10-CM | POA: Diagnosis not present

## 2013-08-12 DIAGNOSIS — B353 Tinea pedis: Secondary | ICD-10-CM | POA: Diagnosis not present

## 2013-08-17 ENCOUNTER — Other Ambulatory Visit: Payer: Medicare Other | Admitting: Internal Medicine

## 2013-08-19 ENCOUNTER — Encounter: Payer: Medicare Other | Admitting: Internal Medicine

## 2013-09-01 ENCOUNTER — Other Ambulatory Visit: Payer: Medicare Other

## 2013-09-07 ENCOUNTER — Other Ambulatory Visit: Payer: Self-pay | Admitting: Internal Medicine

## 2013-09-07 ENCOUNTER — Other Ambulatory Visit: Payer: Medicare Other | Admitting: Internal Medicine

## 2013-09-07 DIAGNOSIS — Z131 Encounter for screening for diabetes mellitus: Secondary | ICD-10-CM | POA: Diagnosis not present

## 2013-09-07 DIAGNOSIS — Z130 Encounter for screening for diseases of the blood and blood-forming organs and certain disorders involving the immune mechanism: Secondary | ICD-10-CM | POA: Diagnosis not present

## 2013-09-07 DIAGNOSIS — I10 Essential (primary) hypertension: Secondary | ICD-10-CM | POA: Diagnosis not present

## 2013-09-07 DIAGNOSIS — E785 Hyperlipidemia, unspecified: Secondary | ICD-10-CM

## 2013-09-07 DIAGNOSIS — E041 Nontoxic single thyroid nodule: Secondary | ICD-10-CM | POA: Diagnosis not present

## 2013-09-07 DIAGNOSIS — Z79899 Other long term (current) drug therapy: Secondary | ICD-10-CM

## 2013-09-07 LAB — CBC WITH DIFFERENTIAL/PLATELET
Basophils Relative: 0 % (ref 0–1)
Eosinophils Absolute: 0.1 10*3/uL (ref 0.0–0.7)
Eosinophils Relative: 1 % (ref 0–5)
MCH: 27 pg (ref 26.0–34.0)
MCHC: 34.4 g/dL (ref 30.0–36.0)
MCV: 78.3 fL (ref 78.0–100.0)
Neutrophils Relative %: 69 % (ref 43–77)
Platelets: 272 10*3/uL (ref 150–400)
RDW: 16.6 % — ABNORMAL HIGH (ref 11.5–15.5)

## 2013-09-07 LAB — COMPREHENSIVE METABOLIC PANEL
ALT: 15 U/L (ref 0–35)
Alkaline Phosphatase: 69 U/L (ref 39–117)
Creat: 0.74 mg/dL (ref 0.50–1.10)
Sodium: 140 mEq/L (ref 135–145)
Total Bilirubin: 0.9 mg/dL (ref 0.3–1.2)
Total Protein: 7.3 g/dL (ref 6.0–8.3)

## 2013-09-07 LAB — LIPID PANEL
Cholesterol: 217 mg/dL — ABNORMAL HIGH (ref 0–200)
LDL Cholesterol: 134 mg/dL — ABNORMAL HIGH (ref 0–99)
Total CHOL/HDL Ratio: 3.1 Ratio
Triglycerides: 63 mg/dL (ref <150)
VLDL: 13 mg/dL (ref 0–40)

## 2013-09-08 LAB — VITAMIN D 25 HYDROXY (VIT D DEFICIENCY, FRACTURES): Vit D, 25-Hydroxy: 48 ng/mL (ref 30–89)

## 2013-09-09 ENCOUNTER — Ambulatory Visit (INDEPENDENT_AMBULATORY_CARE_PROVIDER_SITE_OTHER): Payer: Medicare Other | Admitting: Internal Medicine

## 2013-09-09 ENCOUNTER — Encounter: Payer: Self-pay | Admitting: Internal Medicine

## 2013-09-09 VITALS — BP 136/82 | HR 88 | Temp 99.4°F | Resp 18 | Wt 193.0 lb

## 2013-09-09 DIAGNOSIS — E785 Hyperlipidemia, unspecified: Secondary | ICD-10-CM

## 2013-09-09 DIAGNOSIS — R8290 Unspecified abnormal findings in urine: Secondary | ICD-10-CM

## 2013-09-09 DIAGNOSIS — R82998 Other abnormal findings in urine: Secondary | ICD-10-CM | POA: Diagnosis not present

## 2013-09-09 DIAGNOSIS — Z862 Personal history of diseases of the blood and blood-forming organs and certain disorders involving the immune mechanism: Secondary | ICD-10-CM

## 2013-09-09 DIAGNOSIS — I10 Essential (primary) hypertension: Secondary | ICD-10-CM | POA: Diagnosis not present

## 2013-09-09 DIAGNOSIS — Z0000 Encounter for general adult medical examination without abnormal findings: Secondary | ICD-10-CM

## 2013-09-09 DIAGNOSIS — Z8639 Personal history of other endocrine, nutritional and metabolic disease: Secondary | ICD-10-CM

## 2013-09-09 DIAGNOSIS — J309 Allergic rhinitis, unspecified: Secondary | ICD-10-CM

## 2013-09-09 DIAGNOSIS — F411 Generalized anxiety disorder: Secondary | ICD-10-CM

## 2013-09-09 DIAGNOSIS — E669 Obesity, unspecified: Secondary | ICD-10-CM

## 2013-09-09 LAB — POCT URINALYSIS DIPSTICK
Bilirubin, UA: NEGATIVE
Protein, UA: NEGATIVE
pH, UA: 6

## 2013-09-09 LAB — HEMOGLOBIN A1C
Hgb A1c MFr Bld: 5.9 % — ABNORMAL HIGH (ref <5.7)
Mean Plasma Glucose: 123 mg/dL — ABNORMAL HIGH (ref <117)

## 2013-09-09 MED ORDER — MAXZIDE 75-50 MG PO TABS: ORAL_TABLET | ORAL | Status: DC

## 2013-09-09 MED ORDER — AMLODIPINE BESYLATE 5 MG PO TABS: ORAL_TABLET | ORAL | Status: DC

## 2013-09-09 NOTE — Patient Instructions (Addendum)
Continue same medications and return in 6 months. Please try to diet exercise and lose some weight. 

## 2013-09-09 NOTE — Progress Notes (Signed)
Subjective:    [name], female    DOB: November 21, 1941, 72 y.o.   MRN: [id number]  HPI  72 year old Black female for health maintenance and evaluation of medical problems. History of hypertension, hyperlipidemia, obesity, allergic rhinitis. Has been unable to lose weight and improve her lipids. Husband is in a nursing home and this is been stressful. She has been on Maxzide 75/50 since 1999 for hypertension. In 2010, Norvasc 5 mg daily was added. History of anxiety and depression in 1998. Has had colonoscopy by Dr. Randa Evens. Hysterectomy without oophorectomy for cervical cancer in in 1977 in [place]. Dr. Billy Coast is now doing GYN exams. Right hammertoe surgery December 2002. History of vitamin D deficiency. History of thyroid nodule followed by Dr. Lucianne Muss.  Patient is intolerant of penicillin says it causes chest pain. She is intolerant of codeine.  Social history: Married, has 2 Masters degree, is a retired Runner, broadcasting/film/video. No children. Husband is a retired Tourist information centre manager who has developed dementia.  Family history: Father with history of diabetes and hypertension now deceased. Mother with history of hypertension and dementia. One sister.    Review of Systems  Constitutional: Negative.   HENT: Negative.   Eyes: Negative.   Respiratory: Negative.   Cardiovascular: Negative.   Gastrointestinal: Negative.   Endocrine:       Sees Dr. Lucianne Muss by thyroid nodule wants to do biopsy  Genitourinary: Negative.   Musculoskeletal:       Aching and stiffness both knees relieved by Tylenol  Allergic/Immunologic: Positive for environmental allergies.  Neurological: Negative.        Objective:   Physical Exam  Vitals reviewed. Constitutional: She is oriented to person, place, and time. She appears well-developed and well-nourished. No distress.  Eyes: Pupils are equal, round, and reactive to light.  Neck: Normal range of motion. Neck supple.  Cardiovascular: Normal rate, regular rhythm  and normal heart sounds.   No murmur heard. Pulmonary/Chest: Breath sounds normal.  Breasts normal  Abdominal: Soft. Bowel sounds are normal. She exhibits no distension and no mass. There is no tenderness. There is no rebound and no guarding.  Genitourinary:  Deferred to Dr. Billy Coast  Neurological: She is alert and oriented to person, place, and time. She has normal reflexes. No cranial nerve deficit.  Skin: Skin is warm and dry. She is not diaphoretic.  Psychiatric: She has a normal mood and affect. Her behavior is normal. Judgment and thought content normal.          Assessment & Plan:  Hypertension  Hyperlipidemia  Allergic rhinitis  History of thyroid nodule  Plan: Encouraged diet exercise and weight loss. Continue same medications. Thyroid nodule followup per Dr. Lucianne Muss.    Subjective:   Patient presents for Medicare Annual/Subsequent preventive examination.   Review Past Medical/Family/Social: see above   Risk Factors  Current exercise habits: -walks some.  Did not hire personal trainer Dietary issues discussed: low fat low carb  Cardiac risk factors:  Depression Screen  (Note: if answer to either of the following is "Yes", a more complete depression screening is indicated)   Over the past two weeks, have you felt down, depressed or hopeless? No  Over the past two weeks, have you felt little interest or pleasure in doing things? No Have you lost interest or pleasure in daily life? No Do you often feel hopeless? No Do you cry easily over simple problems? No   Activities of Daily Living  In your present state of  health, do you have any difficulty performing the following activities?:   Driving? No  Managing money? No  Feeding yourself? No  Getting from bed to chair? No  Climbing a flight of stairs? No  Preparing food and eating?: No  Bathing or showering? No  Getting dressed: No  Getting to the toilet? No  Using the toilet:No  Moving around from place  to place: No  In the past year have you fallen or had a near fall?:No  Are you sexually active? No  Do you have more than one partner? No   Hearing Difficulties: No  Do you often ask people to speak up or repeat themselves? No  Do you experience ringing or noises in your ears? No  Do you have difficulty understanding soft or whispered voices? No  Do you feel that you have a problem with memory? No Do you often misplace items? No    Home Safety:  Do you have a smoke alarm at your residence? Yes Do you have grab bars in the bathroom? yes Do you have throw rugs in your house? no   Cognitive Testing  Alert? Yes Normal Appearance?Yes  Oriented to person? Yes Place? Yes  Time? Yes  Recall of three objects? Yes  Can perform simple calculations? Yes  Displays appropriate judgment?Yes  Can read the correct time from a watch face?Yes   List the Names of Other Physician/Practitioners you currently use:  See referral list for the physicians patient is currently seeing. Dr. Maxwell Marion,  Dr. Randa Evens- GI; Dr. Elmer Picker for eyes    Review of Systems: see above    Objective:     General appearance: Appears stated age and mildly obese  Head: Normocephalic, without obvious abnormality, atraumatic  Eyes: conj clear, EOMi PEERLA  Ears: normal TM's and external ear canals both ears  Nose: Nares normal. Septum midline. Mucosa normal. No drainage or sinus tenderness.  Throat: lips, mucosa, and tongue normal; teeth and gums normal  Neck: no adenopathy, no carotid bruit, no JVD, supple, symmetrical, trachea midline and thyroid not enlarged, symmetric, no tenderness/mass/nodules  No CVA tenderness.  Lungs: clear to auscultation bilaterally  Breasts: normal appearance, no masses or tenderness Heart: regular rate and rhythm, S1, S2 normal, no murmur, click, rub or gallop  Abdomen: soft, non-tender; bowel sounds normal; no masses, no organomegaly  Musculoskeletal: ROM normal in all joints, no  crepitus, no deformity, Normal muscle strengthen. Back  is symmetric, no curvature. Skin: Skin color, texture, turgor normal. No rashes or lesions  Lymph nodes: Cervical, supraclavicular, and axillary nodes normal.  Neurologic: CN 2 -12 Normal, Normal symmetric reflexes. Normal coordination and gait  Psych: Alert & Oriented x 3, Mood appear stable.    Assessment:    Annual wellness medicare exam   Plan:    During the course of the visit the patient was educated and counseled about appropriate screening and preventive services including:   Colonoscopy due 2015 See Dr. Billy Coast November Mammogram next week- [facility]     Patient Instructions (the written plan) was given to the patient.  Medicare Attestation  I have personally reviewed:  The patient's medical and social history  Their use of alcohol, tobacco or illicit drugs  Their current medications and supplements  The patient's functional ability including ADLs,fall risks, home safety risks, cognitive, and hearing and visual impairment  Diet and physical activities  Evidence for depression or mood disorders  The patient's weight, height, BMI, and visual acuity have been recorded in the  chart. I have made referrals, counseling, and provided education to the patient based on review of the above and I have provided the patient with a written personalized care plan for preventive services.

## 2013-09-13 NOTE — Progress Notes (Signed)
Patient informed. Will fill Rx for Cipro

## 2013-09-14 ENCOUNTER — Ambulatory Visit
Admission: RE | Admit: 2013-09-14 | Discharge: 2013-09-14 | Disposition: A | Discharge status: Home / Self Care | Payer: Medicare Other | Source: Ambulatory Visit | Attending: Endocrinology | Admitting: Endocrinology

## 2013-09-14 ENCOUNTER — Other Ambulatory Visit (HOSPITAL_COMMUNITY)
Admission: RE | Admit: 2013-09-14 | Discharge: 2013-09-14 | Disposition: A | Discharge status: Home / Self Care | Payer: Medicare Other | Source: Ambulatory Visit | Attending: Interventional Radiology | Admitting: Interventional Radiology

## 2013-09-14 DIAGNOSIS — E049 Nontoxic goiter, unspecified: Secondary | ICD-10-CM | POA: Diagnosis not present

## 2013-09-14 DIAGNOSIS — E041 Nontoxic single thyroid nodule: Secondary | ICD-10-CM

## 2013-09-14 DIAGNOSIS — E0789 Other specified disorders of thyroid: Secondary | ICD-10-CM | POA: Diagnosis not present

## 2013-09-15 NOTE — Progress Notes (Signed)
Quick Note:  Please let patient know that the biopsy result is benign and no further action needed, need to see her back in followup in 3 months, to schedule appointment and get the old records ______

## 2013-09-16 ENCOUNTER — Telehealth: Payer: Self-pay | Admitting: *Deleted

## 2013-09-16 NOTE — Telephone Encounter (Signed)
Message copied by [name] on Thu Sep 16, 2013  2:46 PM ------      Message from: Lisa [name]      Created: Wed Sep 15, 2013  8:50 PM       Please let patient know that the biopsy result is benign and no further action needed, need to see her back in followup in 3 months, to schedule appointment and get the old records ------

## 2013-09-16 NOTE — Telephone Encounter (Signed)
Left message to return call 

## 2013-09-20 DIAGNOSIS — Z1231 Encounter for screening mammogram for malignant neoplasm of breast: Secondary | ICD-10-CM | POA: Diagnosis not present

## 2013-11-09 ENCOUNTER — Ambulatory Visit (INDEPENDENT_AMBULATORY_CARE_PROVIDER_SITE_OTHER): Payer: Medicare Other

## 2013-11-09 DIAGNOSIS — Z23 Encounter for immunization: Secondary | ICD-10-CM

## 2013-11-22 ENCOUNTER — Encounter: Payer: Self-pay | Admitting: Podiatry

## 2013-11-22 ENCOUNTER — Ambulatory Visit (INDEPENDENT_AMBULATORY_CARE_PROVIDER_SITE_OTHER): Payer: Medicare Other | Admitting: Podiatry

## 2013-11-22 VITALS — BP 126/68 | HR 103 | Resp 16 | Ht 64.5 in | Wt 190.0 lb

## 2013-11-22 DIAGNOSIS — M79609 Pain in unspecified limb: Secondary | ICD-10-CM

## 2013-11-22 DIAGNOSIS — B351 Tinea unguium: Secondary | ICD-10-CM

## 2013-11-22 NOTE — Progress Notes (Signed)
[name], female   DOB: [birth year], 72 y.o.   MRN: [id number]  Subjective: 72 year old black female orientated x3 presents for ongoing debridement of painful mycotic toenails and keratoses at approximately three-month intervals. She has been a patient in this office since 1998.  Objective: Incurvated, hypertrophic, discolored toenails with palpable tenderness in all nail plates. Nucleated keratoses plantarly x1 noted.  Assessment: Symptomatic onychomycoses x10 Porokeratoses x1  Plan: Nails x10 are debrided back without any bleeding. Keratoses x1 debrided back without any bleeding. Reappoint at three-month intervals.

## 2013-11-23 DIAGNOSIS — Z124 Encounter for screening for malignant neoplasm of cervix: Secondary | ICD-10-CM | POA: Diagnosis not present

## 2013-11-23 DIAGNOSIS — Z01419 Encounter for gynecological examination (general) (routine) without abnormal findings: Secondary | ICD-10-CM | POA: Diagnosis not present

## 2013-12-09 DIAGNOSIS — H1045 Other chronic allergic conjunctivitis: Secondary | ICD-10-CM | POA: Diagnosis not present

## 2013-12-09 DIAGNOSIS — H2510 Age-related nuclear cataract, unspecified eye: Secondary | ICD-10-CM | POA: Diagnosis not present

## 2013-12-09 DIAGNOSIS — H40039 Anatomical narrow angle, unspecified eye: Secondary | ICD-10-CM | POA: Diagnosis not present

## 2013-12-09 DIAGNOSIS — H11159 Pinguecula, unspecified eye: Secondary | ICD-10-CM | POA: Diagnosis not present

## 2014-01-24 ENCOUNTER — Ambulatory Visit (INDEPENDENT_AMBULATORY_CARE_PROVIDER_SITE_OTHER): Payer: Medicare Other | Admitting: Endocrinology

## 2014-01-24 ENCOUNTER — Encounter: Payer: Self-pay | Admitting: Endocrinology

## 2014-01-24 VITALS — BP 146/82 | HR 114 | Temp 98.3°F | Resp 14 | Ht 64.5 in | Wt 191.5 lb

## 2014-01-24 DIAGNOSIS — E042 Nontoxic multinodular goiter: Secondary | ICD-10-CM | POA: Diagnosis not present

## 2014-01-24 MED ORDER — LEVOTHYROXINE SODIUM 75 MCG PO TABS: 75.0000 ug | ORAL_TABLET | Freq: Every day | ORAL | Status: DC

## 2014-01-24 NOTE — Progress Notes (Signed)
[name], female   DOB: 11/06/1941, 73 y.o.   MRN: [id number]    Reason for Appointment: Goiter, followup    History of Present Illness:   The patient's thyroid enlargement was first discovered in early 2012 when she was having a CT scan of her neck Subsequent ultrasound showed a 4.6 cm left-sided thyroid nodule. Aspiration biopsy of this showed non-neoplastic goiter  She was last seen in 04/2013 At bedtime because of clinically larger right lobe of the thyroid an ultrasound was repeated which showed the following: The dominant nodule in the lower pole of the left lobe measures 4.6 x 3.7 x 4.9 cm and is larger.  Previously, it measured 4.6 x 3.0 x 3.6 cm.  Right lobe nodules are not significantly changed. Right upper pole nodule is 13 x 9 x 9 mm. Right lower pole nodule is 9 x 6 x 8 mm Because of the increased left lobe another needle aspiration was done which was also benign. She is now here for followup  She has had no difficulty with swallowing  Does not feel like she has any choking sensation in her neck or pressure in any position or when lying down.   Lab Results  Component Value Date   TSH 1.609 09/07/2013       Medication List       This list is accurate as of: 01/24/14  1:44 PM.  Always use your most recent med list.               amLODipine 5 MG tablet  Commonly known as:  NORVASC  TAKE 1 TABLET (5 MG TOTAL) BY MOUTH DAILY.     CALTRATE 600+D PLUS 600-400 MG-UNIT per tablet  Chew 1 tablet by mouth daily.     fexofenadine 180 MG tablet  Commonly known as:  ALLEGRA  Take 180 mg by mouth daily.     Garlic 696 MG Tabs  Take 1,000 mg by mouth.     MAXZIDE 75-50 MG per tablet  Generic drug:  triamterene-hydrochlorothiazide  TAKE 1 TABLET EVERY DAY     multivitamin tablet  Take 1 tablet by mouth daily.     VITAMIN D (CHOLECALCIFEROL) PO  Take by mouth.        Allergies:  Allergies  Allergen Reactions  . Penicillins Other (See  Comments)    Chest pain  . Codeine   . Darvon     Past Medical History  Diagnosis Date  . Cancer     cervical  . Hypertension   . Obesity   . Allergy   . Osteopenia   . Hyperlipidemia   . Vitamin D deficiency     Past Surgical History  Procedure Laterality Date  . Abdominal hysterectomy    . Hammer toe surgery  12/02    right  . Eye surgery      Family History  Problem Relation Age of Onset  . Hypertension Mother   . Heart disease Father   . Hypertension Father     Social History:  reports that she has never smoked. She does not have any smokeless tobacco history on file. She reports that she does not drink alcohol or use illicit drugs.   Review of Systems:  There is no history of high blood pressure.             No  history of Diabetes.             Examination:  BP 146/82  Pulse 114  Temp(Src) 98.3 F (36.8 C)  Resp 14  Ht 5' 4.5" (1.638 m)  Wt 191 lb 8 oz (86.864 kg)  BMI 32.38 kg/m2  SpO2 94%   General Appearance: pleasant,                Neck: The thyroid is enlarged 3x over the left lobe extending to the isthmus. This is smooth and slightly firm; mostly palpable on swallowing. Right lobe is about 1-1/2-2 times normal, firm and smooth, no distinct nodules felt There is no stridor. Pemberton sign is negative There is no lymphadenopathy in the neck .    Neurological: REFLEXES: at biceps are normal.   Assessment/Plan:  Multinodular goiter with dominant left lobe nodule which has been proven benign by biopsy twice Clinically her thyroid enlargement appears stable and she is fairly asymptomatic However because of the previous tendency of the thyroid nodule to grow will empirically start her on 75 mcg of levothyroxine She will have TSH done in March when she is seen by PCP and will adjust her dose as needed She will followup in 6 months    [facility] 01/24/2014

## 2014-02-14 ENCOUNTER — Ambulatory Visit: Payer: Medicare Other | Admitting: Podiatry

## 2014-02-14 ENCOUNTER — Ambulatory Visit (INDEPENDENT_AMBULATORY_CARE_PROVIDER_SITE_OTHER): Payer: Medicare Other | Admitting: Podiatry

## 2014-02-14 ENCOUNTER — Encounter: Payer: Self-pay | Admitting: Podiatry

## 2014-02-14 VITALS — BP 139/74 | HR 89 | Resp 12

## 2014-02-14 DIAGNOSIS — M79609 Pain in unspecified limb: Secondary | ICD-10-CM

## 2014-02-14 DIAGNOSIS — Q828 Other specified congenital malformations of skin: Secondary | ICD-10-CM

## 2014-02-14 DIAGNOSIS — B351 Tinea unguium: Secondary | ICD-10-CM

## 2014-02-14 NOTE — Progress Notes (Signed)
[name], female   DOB: 03/17/1941, 73 y.o.   MRN: [id number] Subjective: 73 year old black female orientated x3 presents for ongoing debridement of painful mycotic toenails and keratoses at approximately three-month intervals. She has been a patient in this office since 1998.   Objective: Incurvated, hypertrophic, discolored toenails with palpable tenderness in all nail plates. Nucleated plantar keratoses x1 noted.   Assessment: Symptomatic onychomycoses x10  Porokeratoses x1   Plan: Nails x10 are debrided back without any bleeding. Keratoses x1 debrided back without any bleeding. Reappoint at three-month intervals.

## 2014-02-21 ENCOUNTER — Encounter: Payer: Self-pay | Admitting: Internal Medicine

## 2014-03-15 ENCOUNTER — Other Ambulatory Visit: Payer: Medicare Other | Admitting: Internal Medicine

## 2014-03-15 ENCOUNTER — Other Ambulatory Visit: Payer: Self-pay | Admitting: Internal Medicine

## 2014-03-15 DIAGNOSIS — E039 Hypothyroidism, unspecified: Secondary | ICD-10-CM | POA: Diagnosis not present

## 2014-03-15 DIAGNOSIS — E785 Hyperlipidemia, unspecified: Secondary | ICD-10-CM

## 2014-03-15 DIAGNOSIS — R7301 Impaired fasting glucose: Secondary | ICD-10-CM | POA: Diagnosis not present

## 2014-03-15 LAB — LIPID PANEL
Cholesterol: 218 mg/dL — ABNORMAL HIGH (ref 0–200)
HDL: 68 mg/dL (ref >39)
LDL CALC: 138 mg/dL — AB (ref 0–99)
Total CHOL/HDL Ratio: 3.2 Ratio
Triglycerides: 58 mg/dL (ref <150)
VLDL: 12 mg/dL (ref 0–40)

## 2014-03-15 LAB — TSH: TSH: 1.297 u[IU]/mL (ref 0.350–4.500)

## 2014-03-18 ENCOUNTER — Encounter: Payer: Self-pay | Admitting: Internal Medicine

## 2014-03-18 ENCOUNTER — Ambulatory Visit (INDEPENDENT_AMBULATORY_CARE_PROVIDER_SITE_OTHER): Payer: Medicare Other | Admitting: Internal Medicine

## 2014-03-18 VITALS — BP 136/72 | HR 92 | Temp 98.3°F | Wt 188.0 lb

## 2014-03-18 DIAGNOSIS — M1712 Unilateral primary osteoarthritis, left knee: Secondary | ICD-10-CM

## 2014-03-18 DIAGNOSIS — Z23 Encounter for immunization: Secondary | ICD-10-CM | POA: Diagnosis not present

## 2014-03-18 DIAGNOSIS — IMO0002 Reserved for concepts with insufficient information to code with codable children: Secondary | ICD-10-CM

## 2014-03-18 DIAGNOSIS — M1710 Unilateral primary osteoarthritis, unspecified knee: Secondary | ICD-10-CM | POA: Diagnosis not present

## 2014-03-18 DIAGNOSIS — F411 Generalized anxiety disorder: Secondary | ICD-10-CM | POA: Diagnosis not present

## 2014-03-18 DIAGNOSIS — E785 Hyperlipidemia, unspecified: Secondary | ICD-10-CM

## 2014-03-18 DIAGNOSIS — I10 Essential (primary) hypertension: Secondary | ICD-10-CM | POA: Diagnosis not present

## 2014-03-18 LAB — HEMOGLOBIN A1C
HEMOGLOBIN A1C: 5.7 % — AB (ref <5.7)
MEAN PLASMA GLUCOSE: 117 mg/dL — AB (ref <117)

## 2014-03-18 MED ORDER — TETANUS-DIPHTH-ACELL PERTUSSIS 5-2.5-18.5 LF-MCG/0.5 IM SUSP: 0.5000 mL | Freq: Once | INTRAMUSCULAR | Status: DC

## 2014-03-18 MED ORDER — ALPRAZOLAM 0.5 MG PO TBDP: 0.5000 mg | ORAL_TABLET | Freq: Every evening | ORAL | Status: DC | PRN

## 2014-03-18 NOTE — Patient Instructions (Signed)
Take Xanax sparingly for sleep. RTC 6 months.

## 2014-04-18 ENCOUNTER — Ambulatory Visit (INDEPENDENT_AMBULATORY_CARE_PROVIDER_SITE_OTHER): Payer: Medicare Other | Admitting: Podiatry

## 2014-04-18 ENCOUNTER — Encounter: Payer: Self-pay | Admitting: Podiatry

## 2014-04-18 VITALS — BP 131/69 | HR 80 | Resp 16 | Ht 63.25 in | Wt 187.0 lb

## 2014-04-18 DIAGNOSIS — M79609 Pain in unspecified limb: Secondary | ICD-10-CM | POA: Diagnosis not present

## 2014-04-18 DIAGNOSIS — B351 Tinea unguium: Secondary | ICD-10-CM | POA: Diagnosis not present

## 2014-04-18 NOTE — Progress Notes (Signed)
[name], female   DOB: 12/23/1941, 73 y.o.   MRN: [id number]  Subjective: This patient presents for ongoing debridement of mycotic toenails an intermittent debridement of keratoses.  Objective: Elongated, hypertrophic, discolored toenails x10. Keratoses fifth right toe noted  Assessment: Symptomatic onychomycoses x10 Keratoses x1  Plan: Nails x10 are debrided and keratoses x1 debrided without a bleeding.  Patient is requesting return visit at 61 days.

## 2014-06-10 ENCOUNTER — Telehealth: Payer: Self-pay | Admitting: Internal Medicine

## 2014-06-10 NOTE — Telephone Encounter (Signed)
Spoke with Dr. Renold Genta; she advised patient has been going [facility] and has indeed been seen by [facility].  Patient can call their office and make herself an appointment to have knee pain evaluated.  [facility] for patient @ [phone]; advised for her to contact Ortho office and s/u appointment to have pain evaluated.  She was instructed to call our office back if she had any further questions.

## 2014-06-27 ENCOUNTER — Encounter: Payer: Self-pay | Admitting: Podiatry

## 2014-06-27 ENCOUNTER — Ambulatory Visit (INDEPENDENT_AMBULATORY_CARE_PROVIDER_SITE_OTHER): Payer: Medicare Other | Admitting: Podiatry

## 2014-06-27 VITALS — BP 113/65 | HR 83 | Resp 18

## 2014-06-27 DIAGNOSIS — Q828 Other specified congenital malformations of skin: Secondary | ICD-10-CM | POA: Diagnosis not present

## 2014-06-27 DIAGNOSIS — M79609 Pain in unspecified limb: Secondary | ICD-10-CM | POA: Diagnosis not present

## 2014-06-27 DIAGNOSIS — M79673 Pain in unspecified foot: Secondary | ICD-10-CM

## 2014-06-27 DIAGNOSIS — B351 Tinea unguium: Secondary | ICD-10-CM

## 2014-06-28 DIAGNOSIS — M1710 Unilateral primary osteoarthritis, unspecified knee: Secondary | ICD-10-CM | POA: Diagnosis not present

## 2014-06-28 NOTE — Progress Notes (Signed)
[name], female   DOB: 02/11/1941, 73 y.o.   MRN: [id number]  Subjective: Orientated x3 black female presents complaining of painful toenails and keratoses on the fifth right toe. She says that her toes are very uncomfortable towards the end of the 61 days.  Objective: Hypertrophic, discolored, brittle, incurvated toenails x10 Keratoses lateral border of fifth right toe  Assessment: Symptomatic onychomycoses 6-10 Keratoses x1  Plan: Nails x10 and keratoses x1 debrided without a bleeding  Reappoint x61 days

## 2014-07-07 ENCOUNTER — Other Ambulatory Visit: Payer: Self-pay | Admitting: Gastroenterology

## 2014-07-07 DIAGNOSIS — Z1211 Encounter for screening for malignant neoplasm of colon: Secondary | ICD-10-CM | POA: Diagnosis not present

## 2014-07-07 DIAGNOSIS — D126 Benign neoplasm of colon, unspecified: Secondary | ICD-10-CM | POA: Diagnosis not present

## 2014-07-07 DIAGNOSIS — K648 Other hemorrhoids: Secondary | ICD-10-CM | POA: Diagnosis not present

## 2014-07-07 DIAGNOSIS — K5730 Diverticulosis of large intestine without perforation or abscess without bleeding: Secondary | ICD-10-CM | POA: Diagnosis not present

## 2014-07-07 LAB — HM COLONOSCOPY

## 2014-07-19 DIAGNOSIS — H40039 Anatomical narrow angle, unspecified eye: Secondary | ICD-10-CM | POA: Diagnosis not present

## 2014-07-21 ENCOUNTER — Ambulatory Visit (INDEPENDENT_AMBULATORY_CARE_PROVIDER_SITE_OTHER): Payer: Medicare Other | Admitting: Endocrinology

## 2014-07-21 ENCOUNTER — Encounter: Payer: Self-pay | Admitting: Endocrinology

## 2014-07-21 VITALS — BP 134/70 | HR 90 | Resp 14 | Ht 64.5 in | Wt 183.6 lb

## 2014-07-21 DIAGNOSIS — E042 Nontoxic multinodular goiter: Secondary | ICD-10-CM | POA: Diagnosis not present

## 2014-07-21 MED ORDER — LEVOTHYROXINE SODIUM 75 MCG PO TABS: 75.0000 ug | ORAL_TABLET | Freq: Every day | ORAL | Status: DC

## 2014-07-21 NOTE — Progress Notes (Signed)
[name], female   DOB: [birth year], 73 y.o.   MRN: [id number]    Reason for Appointment: Goiter, followup    History of Present Illness:   The patient's thyroid enlargement was first discovered in early 2012 when she was having a CT scan of her neck Subsequent ultrasound showed a 4.6 cm left-sided thyroid nodule. Aspiration biopsy of this showed non-neoplastic goiter  She was last seen in 12/2013  She has had no difficulty with swallowing   Does not feel like she has any choking sensation in her neck or pressure in any position or when lying down. On her last visit because of the tendency for her thyroid nodule to be gradually increasing she was told to try levothyroxine 75 mcg empirically to help with preventing further growth She misunderstood why she was supposed to take this and did not start the medication at all  Her last ultrasound in 04/2013 showed the following: The dominant nodule in the lower pole of the left lobe measures 4.6 x 3.7 x 4.9 cm and is larger.  Previously, it measured 4.6 x 3.0 x 3.6 cm.  Right lobe nodules are not significantly changed. Right upper pole nodule is 13 x 9 x 9 mm. Right lower pole nodule is 9 x 6 x 8 mm Because of the increased left lobe another needle aspiration was done in 7/14 which was also benign.   Lab Results  Component Value Date   TSH 1.297 03/15/2014       Medication List       This list is accurate as of: 07/21/14  1:09 PM.  Always use your most recent med list.               ALPRAZolam 0.5 MG dissolvable tablet  Commonly known as:  NIRAVAM  Take 1 tablet (0.5 mg total) by mouth at bedtime as needed for anxiety.     amLODipine 5 MG tablet  Commonly known as:  NORVASC  TAKE 1 TABLET (5 MG TOTAL) BY MOUTH DAILY.     CALTRATE 600+D PLUS 600-400 MG-UNIT per tablet  Chew 1 tablet by mouth daily.     econazole nitrate 1 % cream     fexofenadine 180 MG tablet  Commonly known as:  ALLEGRA  Take 180 mg by  mouth daily.     Fish Oil 1000 MG Cpdr  Take by mouth.     Garlic 174 MG Tabs  Take 1,000 mg by mouth.     levothyroxine 75 MCG tablet  Commonly known as:  SYNTHROID, LEVOTHROID  Take 1 tablet (75 mcg total) by mouth daily.     MAXZIDE 75-50 MG per tablet  Generic drug:  triamterene-hydrochlorothiazide  TAKE 1 TABLET EVERY DAY     multivitamin tablet  Take 1 tablet by mouth daily.     VITAMIN D (CHOLECALCIFEROL) PO  Take by mouth.        Allergies:  Allergies  Allergen Reactions  . Penicillins Other (See Comments)    Chest pain  . Codeine   . Darvon     Past Medical History  Diagnosis Date  . Cancer     cervical  . Hypertension   . Obesity   . Allergy   . Osteopenia   . Hyperlipidemia   . Vitamin D deficiency     Past Surgical History  Procedure Laterality Date  . Abdominal hysterectomy    . Hammer toe surgery  12/02    right  .  Eye surgery      Family History  Problem Relation Age of Onset  . Hypertension Mother   . Heart disease Father   . Hypertension Father     Social History:  reports that she has never smoked. She does not have any smokeless tobacco history on file. She reports that she does not drink alcohol or use illicit drugs.   Review of Systems:  There is a  history of high blood pressure.             No  history of Diabetes.             Examination:   BP 134/70  Pulse 90  Resp 14  Ht 5' 4.5" (1.638 m)  Wt 183 lb 9.6 oz (83.28 kg)  BMI 31.04 kg/m2  SpO2 95%   General Appearance:  looks well               Neck: The thyroid is enlarged 3x over the left lobe extending to the isthmus. This is smooth and slightly firm; mostly palpable on swallowing.  Right lobe is about  2 times normal, firm and smooth, no distinct nodules felt There is no stridor.  There is no lymphadenopathy in the neck .    Neurological: REFLEXES: at biceps are normal.   Assessment/Plan:  Multinodular goiter with dominant left lobe nodule which has  been proven benign by biopsy twice This nodule is nearly 5 cm on ultrasound Clinically her thyroid enlargement appears stable and she is again asymptomatic However because of the previous tendency of the thyroid nodule to grow will  start her on 75 mcg of levothyroxine She had not done this on her last visit even though she was told why she was being prescribed the medication but she agrees to take it now She will followup in 3 months with followup thyroid levels  [facility] 07/21/2014

## 2014-07-21 NOTE — Patient Instructions (Signed)
Take thyroid in am 

## 2014-07-26 DIAGNOSIS — D126 Benign neoplasm of colon, unspecified: Secondary | ICD-10-CM | POA: Diagnosis not present

## 2014-08-24 DIAGNOSIS — Z86010 Personal history of colonic polyps: Secondary | ICD-10-CM | POA: Diagnosis not present

## 2014-08-28 ENCOUNTER — Encounter: Payer: Self-pay | Admitting: Internal Medicine

## 2014-08-28 NOTE — Progress Notes (Signed)
   Subjective:    [name], female    DOB: 1941-01-31, 73 y.o.   MRN: [id number]  HPI  For six-month recheck of hypertension,, hyperlipidemia, anxiety, history of goiter. History of benign thyroid nodule. Is on thyroid suppression therapy. Goiter followed by Dr. Dwyane Dee. Husband is a nursing home and has dementia. This is stressful. Mother has dementia and lives in [place]. Has lost 5 pounds. From November until March was going [facility] on her [facility]. Doesn't want to get knee injection. History of osteoarthritis of left knee. Having some anxiety issues. Prescription given for Xanax. Blood pressure stable. Needs tetanus immunization.  Is allergic to codeine and penicillin.     Review of Systems     Objective:   Physical Exam  Skin warm and dry. Nodes none. Border present. Chest clear. Cardiac exam regular rate and rhythm normal S1 and S2. Extremities without edema.      Assessment & Plan:  Hypertension-stable  Hyperlipidemia-treated with diet alone. Doesn't want to be on statin  Border and benign thyroid nodule treated with thyroid suppression therapy  Osteoarthritis left knee  History of glaucoma   Allergic rhinitis  Obesity  Anxiety. Plan: Return in 6 months or as needed. Xanax prescription written. Continue same medications.

## 2014-08-29 ENCOUNTER — Ambulatory Visit: Payer: Medicare Other | Admitting: Podiatry

## 2014-08-29 DIAGNOSIS — M79609 Pain in unspecified limb: Secondary | ICD-10-CM | POA: Diagnosis not present

## 2014-08-29 DIAGNOSIS — B351 Tinea unguium: Secondary | ICD-10-CM | POA: Diagnosis not present

## 2014-08-29 DIAGNOSIS — M2040 Other hammer toe(s) (acquired), unspecified foot: Secondary | ICD-10-CM | POA: Diagnosis not present

## 2014-09-12 ENCOUNTER — Other Ambulatory Visit: Payer: Self-pay | Admitting: Internal Medicine

## 2014-09-12 DIAGNOSIS — H40039 Anatomical narrow angle, unspecified eye: Secondary | ICD-10-CM | POA: Diagnosis not present

## 2014-09-12 DIAGNOSIS — H40029 Open angle with borderline findings, high risk, unspecified eye: Secondary | ICD-10-CM | POA: Diagnosis not present

## 2014-09-15 ENCOUNTER — Other Ambulatory Visit: Payer: Self-pay | Admitting: Internal Medicine

## 2014-09-15 ENCOUNTER — Other Ambulatory Visit: Payer: Medicare Other | Admitting: Internal Medicine

## 2014-09-15 DIAGNOSIS — E041 Nontoxic single thyroid nodule: Secondary | ICD-10-CM | POA: Diagnosis not present

## 2014-09-15 DIAGNOSIS — Z0000 Encounter for general adult medical examination without abnormal findings: Secondary | ICD-10-CM | POA: Diagnosis not present

## 2014-09-15 DIAGNOSIS — E669 Obesity, unspecified: Secondary | ICD-10-CM | POA: Diagnosis not present

## 2014-09-15 DIAGNOSIS — I10 Essential (primary) hypertension: Secondary | ICD-10-CM | POA: Diagnosis not present

## 2014-09-15 DIAGNOSIS — Z130 Encounter for screening for diseases of the blood and blood-forming organs and certain disorders involving the immune mechanism: Secondary | ICD-10-CM

## 2014-09-15 DIAGNOSIS — E785 Hyperlipidemia, unspecified: Secondary | ICD-10-CM | POA: Diagnosis not present

## 2014-09-15 DIAGNOSIS — R7309 Other abnormal glucose: Secondary | ICD-10-CM | POA: Diagnosis not present

## 2014-09-15 LAB — CBC WITH DIFFERENTIAL/PLATELET
Basophils Absolute: 0 10*3/uL (ref 0.0–0.1)
Basophils Relative: 0 % (ref 0–1)
EOS PCT: 1 % (ref 0–5)
Eosinophils Absolute: 0.1 10*3/uL (ref 0.0–0.7)
HCT: 39.1 % (ref 36.0–46.0)
Hemoglobin: 13.1 g/dL (ref 12.0–15.0)
LYMPHS ABS: 1.5 10*3/uL (ref 0.7–4.0)
Lymphocytes Relative: 26 % (ref 12–46)
MCH: 25.9 pg — AB (ref 26.0–34.0)
MCHC: 33.5 g/dL (ref 30.0–36.0)
MCV: 77.3 fL — AB (ref 78.0–100.0)
MONOS PCT: 6 % (ref 3–12)
Monocytes Absolute: 0.4 10*3/uL (ref 0.1–1.0)
Neutro Abs: 4 10*3/uL (ref 1.7–7.7)
Neutrophils Relative %: 67 % (ref 43–77)
Platelets: 245 10*3/uL (ref 150–400)
RBC: 5.06 MIL/uL (ref 3.87–5.11)
RDW: 16.1 % — ABNORMAL HIGH (ref 11.5–15.5)
WBC: 5.9 10*3/uL (ref 4.0–10.5)

## 2014-09-15 LAB — COMPREHENSIVE METABOLIC PANEL
ALT: 19 U/L (ref 0–35)
AST: 17 U/L (ref 0–37)
Albumin: 4.2 g/dL (ref 3.5–5.2)
Alkaline Phosphatase: 72 U/L (ref 39–117)
BUN: 21 mg/dL (ref 6–23)
CALCIUM: 9.9 mg/dL (ref 8.4–10.5)
CHLORIDE: 101 meq/L (ref 96–112)
CO2: 27 mEq/L (ref 19–32)
Creat: 0.75 mg/dL (ref 0.50–1.10)
GLUCOSE: 104 mg/dL — AB (ref 70–99)
Potassium: 3.8 mEq/L (ref 3.5–5.3)
Sodium: 138 mEq/L (ref 135–145)
Total Bilirubin: 0.8 mg/dL (ref 0.2–1.2)
Total Protein: 7.4 g/dL (ref 6.0–8.3)

## 2014-09-15 LAB — TSH: TSH: 0.042 u[IU]/mL — ABNORMAL LOW (ref 0.350–4.500)

## 2014-09-15 LAB — LIPID PANEL
Cholesterol: 231 mg/dL — ABNORMAL HIGH (ref 0–200)
HDL: 67 mg/dL (ref >39)
LDL Cholesterol: 152 mg/dL — ABNORMAL HIGH (ref 0–99)
Total CHOL/HDL Ratio: 3.4 Ratio
Triglycerides: 62 mg/dL (ref <150)
VLDL: 12 mg/dL (ref 0–40)

## 2014-09-16 ENCOUNTER — Encounter: Payer: Self-pay | Admitting: Internal Medicine

## 2014-09-16 ENCOUNTER — Ambulatory Visit (INDEPENDENT_AMBULATORY_CARE_PROVIDER_SITE_OTHER): Payer: Medicare Other | Admitting: Internal Medicine

## 2014-09-16 VITALS — BP 130/78 | HR 100 | Ht 64.5 in | Wt 180.0 lb

## 2014-09-16 DIAGNOSIS — M1710 Unilateral primary osteoarthritis, unspecified knee: Secondary | ICD-10-CM

## 2014-09-16 DIAGNOSIS — E785 Hyperlipidemia, unspecified: Secondary | ICD-10-CM | POA: Diagnosis not present

## 2014-09-16 DIAGNOSIS — J309 Allergic rhinitis, unspecified: Secondary | ICD-10-CM | POA: Diagnosis not present

## 2014-09-16 DIAGNOSIS — E669 Obesity, unspecified: Secondary | ICD-10-CM | POA: Diagnosis not present

## 2014-09-16 DIAGNOSIS — Z862 Personal history of diseases of the blood and blood-forming organs and certain disorders involving the immune mechanism: Secondary | ICD-10-CM | POA: Diagnosis not present

## 2014-09-16 DIAGNOSIS — D126 Benign neoplasm of colon, unspecified: Secondary | ICD-10-CM | POA: Diagnosis not present

## 2014-09-16 DIAGNOSIS — K635 Polyp of colon: Secondary | ICD-10-CM

## 2014-09-16 DIAGNOSIS — I10 Essential (primary) hypertension: Secondary | ICD-10-CM

## 2014-09-16 DIAGNOSIS — Z0000 Encounter for general adult medical examination without abnormal findings: Secondary | ICD-10-CM

## 2014-09-16 DIAGNOSIS — Z8639 Personal history of other endocrine, nutritional and metabolic disease: Secondary | ICD-10-CM | POA: Diagnosis not present

## 2014-09-16 DIAGNOSIS — E049 Nontoxic goiter, unspecified: Secondary | ICD-10-CM

## 2014-09-16 DIAGNOSIS — M170 Bilateral primary osteoarthritis of knee: Secondary | ICD-10-CM

## 2014-09-16 LAB — HEMOGLOBIN A1C
HEMOGLOBIN A1C: 6.3 % — AB (ref <5.7)
Mean Plasma Glucose: 134 mg/dL — ABNORMAL HIGH (ref <117)

## 2014-09-16 LAB — POCT URINALYSIS DIPSTICK
BILIRUBIN UA: NEGATIVE
Blood, UA: NEGATIVE
Glucose, UA: NEGATIVE
KETONES UA: NEGATIVE
LEUKOCYTES UA: NEGATIVE
Nitrite, UA: NEGATIVE
PH UA: 6
Protein, UA: NEGATIVE
SPEC GRAV UA: 1.01
Urobilinogen, UA: NEGATIVE

## 2014-09-16 MED ORDER — AMLODIPINE BESYLATE 5 MG PO TABS: ORAL_TABLET | ORAL | Status: DC

## 2014-09-16 MED ORDER — MAXZIDE 75-50 MG PO TABS: ORAL_TABLET | ORAL | Status: DC

## 2014-09-16 MED ORDER — HYDROCORTISONE 2.5 % RE CREA: 1.0000 "application " | TOPICAL_CREAM | Freq: Two times a day (BID) | RECTAL | Status: DC

## 2014-09-16 NOTE — Progress Notes (Signed)
Subjective:    [name], female    DOB: [birth year], 73 y.o.   MRN: [id number]  HPI 73 year old Black Female in today for health maintenance and evaluation of medical issues  Is to have sessile polyp removed at [facility]. Polyp has been determined to benign but she has been advised to have it removed. Has lost a little over 3 pounds.  Feels OK. Says has cut back on food consumption  Basically 2 meals a day. Started thyroid suppression by Dr. Dwyane Dee for goiter in July. TSH is low on 0.075 mg daily. Will defer management to Dr. Dwyane Dee.  Wants to defer flu shot for now. Have refilled Norvasc and Maxzide for one year. At her request also refill ProctoCream-HC. Despite losing a little bit of weight, lipid panel has not improved. Still has slightly elevated glucose value fasting. Will add hemoglobin A1c.  History of hypertension, hyperlipidemia, obesity, allergic rhinitis. Has been on Maxide 75/50 since 1999 for hypertension. 2010, amlodipine was added. History of anxiety and depression in 1998.  Hysterectomy without oophorectomy for cervical cancer in 1977 in [place]. Dr. Ronita Hipps is now doing GYN exams. Right hammertoe surgery 2002. History of vitamin D deficiency. History of thyroid nodule followed by Dr. Dwyane Dee.  Patient is intolerant of penicillin causes chest pain. She says she is intolerant of codeine.  Social history: Married. Husband is in a nursing home with dementia. This is been stressful. 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 No children. Husband is retired Firefighter.  Family history: Mother with history of hypertension and dementia still living. One sister. Father with history of diabetes and hypertension now deceased.      Review of Systems  Respiratory: Negative.   Cardiovascular:       History of hypertension  Endocrine:       History of goiter and thyroid nodule followed by Dr. Dwyane Dee  Musculoskeletal:       Aching and  stiffness both knees  Allergic/Immunologic:       History of allergic rhinitis  Psychiatric/Behavioral:       History of anxiety       Objective:   Physical Exam  Constitutional: She is oriented to person, place, and time. She appears well-developed and well-nourished. No distress.  HENT:  Head: Normocephalic and atraumatic.  Right Ear: External ear normal.  Left Ear: External ear normal.  Mouth/Throat: Oropharynx is clear and moist.  Eyes: Conjunctivae are normal. Pupils are equal, round, and reactive to light. Right eye exhibits no discharge. Left eye exhibits no discharge. No scleral icterus.  Neck: Neck supple. No JVD present. No thyromegaly present.  Mildly enlarged thyroid  Cardiovascular: Normal rate, regular rhythm and normal heart sounds.   No murmur heard. Pulmonary/Chest: Breath sounds normal. She has no wheezes. She has no rales.  Abdominal: Bowel sounds are normal. She exhibits no distension and no mass. There is no rebound and no guarding.  Genitourinary:  Deferred to GYN  Musculoskeletal: She exhibits no edema.  Osteoarthritis both knees  Lymphadenopathy:    She has no cervical adenopathy.  Neurological: She is alert and oriented to person, place, and time. No cranial nerve deficit. Coordination normal.  Skin: Skin is warm and dry. No rash noted. She is not diaphoretic.  Psychiatric: She has a normal mood and affect. Her behavior is normal. Judgment and thought content normal.  Vitals reviewed.         Assessment & Plan:  Hypertension-stable  Hyperlipidemia-needs improvement with diet and exercise  Allergic rhinitis-stable  History of thyroid nodule and Goiter followed by Dr. Dwyane Dee now on thyroid suppression therapy  Osteoarthritis of the knees  Obesity  Benign colon polyp-to be removed soon in [place]: Encouraged diet exercise and weight loss. Continue same medications. Return in 6 months or as needed.

## 2014-09-17 ENCOUNTER — Encounter: Payer: Self-pay | Admitting: Internal Medicine

## 2014-09-21 ENCOUNTER — Telehealth: Payer: Self-pay | Admitting: Endocrinology

## 2014-09-21 NOTE — Telephone Encounter (Signed)
Requested call back to discuss.  

## 2014-09-21 NOTE — Telephone Encounter (Signed)
Patient want to know if she should take her meds this morning, (Synthyroid 75 mg) shes been on it two months, and it has been low.  Please advise

## 2014-09-22 NOTE — Telephone Encounter (Signed)
Please try to call the pt before ten she is returning your call

## 2014-09-22 NOTE — Telephone Encounter (Signed)
Contacted pt. She states that her PCP, Dr. Renold Genta drew a TSH on 9/17. Pt would like for you to review labs and advised if any medication change is needed.  Please advise, Thanks!

## 2014-09-23 NOTE — Telephone Encounter (Signed)
She can stop her thyroid medication completely as it has raised the thyroid level excessively

## 2014-09-23 NOTE — Telephone Encounter (Signed)
Pt.notified

## 2014-09-23 NOTE — Telephone Encounter (Signed)
Pt calling please let us know asap what the pt is to do regarding her tsh level 0.042 low per Dr. Renold Genta. Does she need to change her thyroid med dosage. She needs to know today she is getting ready to leave for vacation.

## 2014-09-23 NOTE — Telephone Encounter (Signed)
See below and please advise, Thanks!  

## 2014-09-30 DIAGNOSIS — Z01810 Encounter for preprocedural cardiovascular examination: Secondary | ICD-10-CM | POA: Diagnosis not present

## 2014-10-04 DIAGNOSIS — D122 Benign neoplasm of ascending colon: Secondary | ICD-10-CM | POA: Diagnosis not present

## 2014-10-04 DIAGNOSIS — I10 Essential (primary) hypertension: Secondary | ICD-10-CM | POA: Diagnosis not present

## 2014-10-04 DIAGNOSIS — D121 Benign neoplasm of appendix: Secondary | ICD-10-CM | POA: Diagnosis not present

## 2014-10-04 DIAGNOSIS — E039 Hypothyroidism, unspecified: Secondary | ICD-10-CM | POA: Diagnosis not present

## 2014-10-04 DIAGNOSIS — D120 Benign neoplasm of cecum: Secondary | ICD-10-CM | POA: Diagnosis not present

## 2014-10-19 ENCOUNTER — Other Ambulatory Visit: Payer: Medicare Other

## 2014-10-25 ENCOUNTER — Ambulatory Visit: Payer: Medicare Other | Admitting: Endocrinology

## 2014-10-31 DIAGNOSIS — Z1231 Encounter for screening mammogram for malignant neoplasm of breast: Secondary | ICD-10-CM | POA: Diagnosis not present

## 2014-11-07 ENCOUNTER — Other Ambulatory Visit (INDEPENDENT_AMBULATORY_CARE_PROVIDER_SITE_OTHER): Payer: Medicare Other

## 2014-11-07 DIAGNOSIS — E042 Nontoxic multinodular goiter: Secondary | ICD-10-CM

## 2014-11-07 LAB — TSH: TSH: 0.99 u[IU]/mL (ref 0.35–4.50)

## 2014-11-07 LAB — T4, FREE: Free T4: 1.04 ng/dL (ref 0.60–1.60)

## 2014-11-08 ENCOUNTER — Encounter: Payer: Self-pay | Admitting: Internal Medicine

## 2014-11-10 ENCOUNTER — Ambulatory Visit (INDEPENDENT_AMBULATORY_CARE_PROVIDER_SITE_OTHER): Payer: Medicare Other | Admitting: Endocrinology

## 2014-11-10 ENCOUNTER — Encounter: Payer: Self-pay | Admitting: Endocrinology

## 2014-11-10 VITALS — BP 142/72 | HR 119 | Temp 97.8°F | Resp 16 | Ht 64.5 in | Wt 186.4 lb

## 2014-11-10 DIAGNOSIS — E042 Nontoxic multinodular goiter: Secondary | ICD-10-CM

## 2014-11-10 NOTE — Progress Notes (Signed)
[name], female   DOB: [birth year], 73 y.o.   MRN: [id number]    Reason for Appointment: Goiter, followup    History of Present Illness:   The patient's thyroid enlargement was first discovered in early 2012 when she was having a CT scan of her neck Subsequent ultrasound showed a 4.6 cm left-sided thyroid nodule. Aspiration biopsy of this showed non-neoplastic goiter  Because of the increased size of the left lobe nodule another needle aspiration was done in 7/14 which was also benign.  On her last visit because of the tendency for her thyroid nodule to be gradually increasing she was told to try levothyroxine 75 mcg empirically to help with preventing further growth However follow-up TSH in 9/15 was significantly low and levothyroxine was stopped  She has had no difficulty with swallowing   Does not feel like she has any choking sensation in her neck or pressure in any position or when lying down.  Her last ultrasound in 04/2013 showed the following: The dominant nodule in the lower pole of the left lobe measures 4.6 x 3.7 x 4.9 cm  Previously, it measured 4.6 x 3.0 x 3.6 cm.  Right lobe nodules are not significantly changed. Right upper pole nodule is 13 x 9 x 9 mm. Right lower pole nodule is 9 x 6 x 8 mm   Lab Results  Component Value Date   FREET4 1.04 11/07/2014   TSH 0.99 11/07/2014   TSH 0.042* 09/15/2014   TSH 1.297 03/15/2014        Medication List       This list is accurate as of: 11/10/14  3:16 PM.  Always use your most recent med list.               amLODipine 5 MG tablet  Commonly known as:  NORVASC  TAKE 1 TABLET (5 MG TOTAL) BY MOUTH DAILY.     CALTRATE 600+D PLUS 600-400 MG-UNIT per tablet  Chew 1 tablet by mouth daily.     econazole nitrate 1 % cream     fexofenadine 180 MG tablet  Commonly known as:  ALLEGRA  Take 180 mg by mouth daily.     Fish Oil 1000 MG Cpdr  Take by mouth.     Garlic 607 MG Tabs  Take 1,000 mg by  mouth.     hydrocortisone 2.5 % rectal cream  Commonly known as:  ANUSOL-HC  Place 1 application rectally 2 (two) times daily.     levothyroxine 75 MCG tablet  Commonly known as:  SYNTHROID, LEVOTHROID  Take 1 tablet (75 mcg total) by mouth daily.     MAXZIDE 75-50 MG per tablet  Generic drug:  triamterene-hydrochlorothiazide  TAKE 1 TABLET EVERY DAY     multivitamin tablet  Take 1 tablet by mouth daily.     VITAMIN D (CHOLECALCIFEROL) PO  Take by mouth.        Allergies:  Allergies  Allergen Reactions  . Penicillins Other (See Comments)    Chest pain  . Codeine   . Darvon     Past Medical History  Diagnosis Date  . Cancer     cervical  . Hypertension   . Obesity   . Allergy   . Osteopenia   . Hyperlipidemia   . Vitamin D deficiency     Past Surgical History  Procedure Laterality Date  . Abdominal hysterectomy    . Hammer toe surgery  12/02    right  .  Eye surgery      Family History  Problem Relation Age of Onset  . Hypertension Mother   . Heart disease Father   . Hypertension Father     Social History:  reports that she has never smoked. She does not have any smokeless tobacco history on file. She reports that she does not drink alcohol or use illicit drugs.   Review of Systems:  There is a  history of high blood pressure.   She follows up with PCP for blood pressure, today blood pressure is relatively higher probably from anxiety            No  history of Diabetes.             Examination:   BP 142/72 mmHg  Pulse 119  Temp(Src) 97.8 F (36.6 C)  Resp 16  Ht 5' 4.5" (1.638 m)  Wt 186 lb 6.4 oz (84.55 kg)  BMI 31.51 kg/m2  SpO2 95%                Thyroid exam: She has a large nodule on  the left lobe extending to the isthmus which is mostly palpable on swallowing.  This is smooth and slightly firm and is about 3-4 cm in size; The lobe is about 3 times normal size Right lobe is about  2 times normal, firm and smooth, no distinct  nodules felt Submandibular glands are palpable, nontender  There is no lymphadenopathy in the neck .    Neurological: REFLEXES: at biceps are normal.   Assessment/Plan:  Multinodular goiter with dominant left lobe nodule which has been proven benign by biopsy twice This nodule is nearly 5 cm on ultrasound done in 2014 Clinically her thyroid nodule appears stable in size and she is again asymptomatic Unable to try levothyroxine for suppression since she had a suppressed TSH only on 75 g  She will followup in 6 months for reexamination  To follow-up with PCP for high blood pressure  Yousif Edelson 11/10/2014

## 2014-11-16 ENCOUNTER — Ambulatory Visit (INDEPENDENT_AMBULATORY_CARE_PROVIDER_SITE_OTHER): Payer: Medicare Other | Admitting: Podiatry

## 2014-11-16 ENCOUNTER — Encounter: Payer: Self-pay | Admitting: Podiatry

## 2014-11-16 DIAGNOSIS — M79676 Pain in unspecified toe(s): Secondary | ICD-10-CM | POA: Diagnosis not present

## 2014-11-16 DIAGNOSIS — B351 Tinea unguium: Secondary | ICD-10-CM

## 2014-11-16 NOTE — Progress Notes (Signed)
[name], female   DOB: [birth year], 73 y.o.   MRN: [id number]  Subjective: This patient presents again complaining of painful toenails and a keratoses  Objective: Elongated, hypertrophic, discolored toenails 6-10 Callus lateral nail groove fifth toe right foot  Assessment: Symptomatic onychomycoses 6-10 Listers corn fifth right toe  Plan: Nails 10 and keratoses 1 debrided without a bleeding  Reappoint 61 days

## 2014-11-25 NOTE — Patient Instructions (Signed)
Return in 6 months or as needed. Diet exercise and weight loss encouraged. Continue same medications.

## 2014-11-30 DIAGNOSIS — H1013 Acute atopic conjunctivitis, bilateral: Secondary | ICD-10-CM | POA: Diagnosis not present

## 2014-11-30 DIAGNOSIS — H2513 Age-related nuclear cataract, bilateral: Secondary | ICD-10-CM | POA: Diagnosis not present

## 2014-11-30 DIAGNOSIS — H40023 Open angle with borderline findings, high risk, bilateral: Secondary | ICD-10-CM | POA: Diagnosis not present

## 2014-11-30 DIAGNOSIS — H40033 Anatomical narrow angle, bilateral: Secondary | ICD-10-CM | POA: Diagnosis not present

## 2014-12-01 DIAGNOSIS — Z124 Encounter for screening for malignant neoplasm of cervix: Secondary | ICD-10-CM | POA: Diagnosis not present

## 2014-12-01 DIAGNOSIS — Z01419 Encounter for gynecological examination (general) (routine) without abnormal findings: Secondary | ICD-10-CM | POA: Diagnosis not present

## 2014-12-01 DIAGNOSIS — C539 Malignant neoplasm of cervix uteri, unspecified: Secondary | ICD-10-CM | POA: Diagnosis not present

## 2014-12-01 DIAGNOSIS — Z23 Encounter for immunization: Secondary | ICD-10-CM | POA: Diagnosis not present

## 2014-12-13 ENCOUNTER — Ambulatory Visit: Payer: Medicare Other

## 2014-12-14 DIAGNOSIS — L821 Other seborrheic keratosis: Secondary | ICD-10-CM | POA: Diagnosis not present

## 2014-12-22 ENCOUNTER — Other Ambulatory Visit: Payer: Self-pay | Admitting: *Deleted

## 2014-12-22 MED ORDER — AMLODIPINE BESYLATE 5 MG PO TABS: ORAL_TABLET | ORAL | Status: DC

## 2014-12-22 NOTE — Telephone Encounter (Signed)
Pt requested 30 day supply of medication Norvasc she is going out of town and needs this sent today refill authorization sent to patient pharmacy

## 2015-01-13 ENCOUNTER — Encounter: Payer: Self-pay | Admitting: Internal Medicine

## 2015-02-01 ENCOUNTER — Ambulatory Visit (INDEPENDENT_AMBULATORY_CARE_PROVIDER_SITE_OTHER): Payer: Medicare Other | Admitting: Podiatry

## 2015-02-01 ENCOUNTER — Encounter: Payer: Self-pay | Admitting: Podiatry

## 2015-02-01 DIAGNOSIS — B351 Tinea unguium: Secondary | ICD-10-CM

## 2015-02-01 DIAGNOSIS — M79676 Pain in unspecified toe(s): Secondary | ICD-10-CM | POA: Diagnosis not present

## 2015-02-01 NOTE — Progress Notes (Signed)
[name], female   DOB: June 14, 1941, 74 y.o.   MRN: [id number]  Subjective: This patient presents again complaining of painful toenails and a painful callused nail groove fifth right toe  Objective: The toenails are elongated, discolored, brittle, incurvated and tender to palpation 6-10 Callus lateral nail groove fifth right toe  Assessment: Symptomatic onychomycoses 6-10 Listers corn fifth right toe  Plan: Debrided nails 10 and keratoses 1 without a bleeding  Reappoint 61 days

## 2015-02-15 ENCOUNTER — Ambulatory Visit: Payer: Medicare Other | Admitting: Podiatry

## 2015-03-21 ENCOUNTER — Other Ambulatory Visit: Payer: Medicare Other | Admitting: Internal Medicine

## 2015-03-21 DIAGNOSIS — E119 Type 2 diabetes mellitus without complications: Secondary | ICD-10-CM

## 2015-03-21 DIAGNOSIS — E785 Hyperlipidemia, unspecified: Secondary | ICD-10-CM

## 2015-03-21 LAB — LIPID PANEL
Cholesterol: 226 mg/dL — ABNORMAL HIGH (ref 0–200)
HDL: 60 mg/dL (ref >=46)
LDL CALC: 153 mg/dL — AB (ref 0–99)
TRIGLYCERIDES: 67 mg/dL (ref <150)
Total CHOL/HDL Ratio: 3.8 Ratio
VLDL: 13 mg/dL (ref 0–40)

## 2015-03-21 LAB — HEMOGLOBIN A1C
HEMOGLOBIN A1C: 5.8 % — AB (ref <5.7)
MEAN PLASMA GLUCOSE: 120 mg/dL — AB (ref <117)

## 2015-03-23 ENCOUNTER — Encounter: Payer: Self-pay | Admitting: Internal Medicine

## 2015-03-23 ENCOUNTER — Ambulatory Visit (INDEPENDENT_AMBULATORY_CARE_PROVIDER_SITE_OTHER): Payer: Medicare Other | Admitting: Internal Medicine

## 2015-03-23 VITALS — BP 128/70 | HR 103 | Temp 98.4°F | Wt 182.0 lb

## 2015-03-23 DIAGNOSIS — J309 Allergic rhinitis, unspecified: Secondary | ICD-10-CM

## 2015-03-23 DIAGNOSIS — E119 Type 2 diabetes mellitus without complications: Secondary | ICD-10-CM | POA: Diagnosis not present

## 2015-03-23 DIAGNOSIS — M1712 Unilateral primary osteoarthritis, left knee: Secondary | ICD-10-CM

## 2015-03-23 DIAGNOSIS — E8881 Metabolic syndrome: Secondary | ICD-10-CM | POA: Diagnosis not present

## 2015-03-23 DIAGNOSIS — I10 Essential (primary) hypertension: Secondary | ICD-10-CM | POA: Diagnosis not present

## 2015-03-23 DIAGNOSIS — E785 Hyperlipidemia, unspecified: Secondary | ICD-10-CM

## 2015-03-23 DIAGNOSIS — E669 Obesity, unspecified: Secondary | ICD-10-CM | POA: Diagnosis not present

## 2015-03-23 MED ORDER — ROSUVASTATIN CALCIUM 5 MG PO TABS: 5.0000 mg | ORAL_TABLET | Freq: Every day | ORAL | Status: DC

## 2015-03-23 MED ORDER — LORATADINE 10 MG PO TABS: 10.0000 mg | ORAL_TABLET | Freq: Every day | ORAL | Status: DC

## 2015-03-23 MED ORDER — AMLODIPINE BESYLATE 5 MG PO TABS: ORAL_TABLET | ORAL | Status: DC

## 2015-03-23 NOTE — Progress Notes (Signed)
   Subjective:    [name], female    DOB: 09/09/1941, 74 y.o.   MRN: [id number]  HPI In today to follow-up on hypertension, hyperlipidemia and type 2 diabetes mellitus. Continues to have issues with left knee arthritis. Wants of name of dietitian. This was provided. Unfortunately there has been no improvement in her lipids. Previous lipid panel showed total cholesterol of 231 with an LDL of 152. Now total cholesterol 226 with an LDL of 153. She has some reservations about statin medication but I think with her multiple medical issues she should continue her low-dose statin therapy. I persuaded her to try Crestor 5 mg daily and return in 3 months for follow-up.  Blood pressure is stable on current regimen.  Hemoglobin A1c is 5.8% and previously was 6.3%.  Patient says Dr. Dwyane Dee took her off thyroid suppression therapy for goiter  With regard to large colon polyp that was removed, she says she is to return for repeat colonoscopy in [place] in June. Says if that colonoscopy is negative then she'll be placed on a every 3 year callback.  Review of Systems     Objective:   Physical Exam  Skin warm and dry. Nodes none. Chest clear to auscultation. Cardiac exam regular rate and rhythm. Extremities without edema.        Assessment & Plan:  Hyperlipidemia-persuaded to try Crestor 5 mg daily and follow-up in 3 months with lipid panel liver functions and office visit  Hypertension-stable on current regimen  Type 2 diabetes-improvement in hemoglobin A1c is considerable. Recheck in 6 months.  Allergic rhinitis-recommend Claritin over-the-counter  Osteoarthritis left knee  Obesity-recommended dietitian  History of goiter-all thyroid suppression therapy per Dr. Dwyane Dee  History of colon polyp-benign  Plan: Return in 3 months

## 2015-03-23 NOTE — Patient Instructions (Addendum)
Start Crestor 5 mg daily and return in 3 months. Continue other medications as previously prescribed. Go see dietitian. Try to exercise.

## 2015-04-13 ENCOUNTER — Other Ambulatory Visit: Payer: Self-pay | Admitting: Internal Medicine

## 2015-04-13 NOTE — Telephone Encounter (Signed)
maxide refilled. 

## 2015-04-14 ENCOUNTER — Encounter: Payer: Self-pay | Admitting: Podiatry

## 2015-04-14 ENCOUNTER — Ambulatory Visit (INDEPENDENT_AMBULATORY_CARE_PROVIDER_SITE_OTHER): Payer: Medicare Other | Admitting: Podiatry

## 2015-04-14 VITALS — BP 118/63 | HR 90 | Resp 18

## 2015-04-14 DIAGNOSIS — M79673 Pain in unspecified foot: Secondary | ICD-10-CM

## 2015-04-14 DIAGNOSIS — B351 Tinea unguium: Secondary | ICD-10-CM | POA: Diagnosis not present

## 2015-04-14 DIAGNOSIS — M79676 Pain in unspecified toe(s): Secondary | ICD-10-CM

## 2015-04-17 NOTE — Progress Notes (Signed)
[name], female   DOB: 1941/03/09, 74 y.o.   MRN: [id number]  Subjective: 74 y.o.-year-old female returns the office today for painful, elongated, thickened toenails. Denies any redness or drainage around the nails. Denies any acute changes since last appointment and no new complaints today. Denies any systemic complaints such as fevers, chills, nausea, vomiting.   Objective: AAO 3, NAD DP/PT pulses palpable, CRT less than 3 seconds Protective sensation intact with Simms Weinstein monofilament, Achilles tendon reflex intact.  Nails hypertrophic, dystrophic, elongated, brittle, discolored 10. There is tenderness overlying the nails 1-5 bilaterally. There is no surrounding erythema or drainage along the nail sites. No open lesions or pre-ulcerative lesions are identified. No other areas of tenderness bilateral lower extremities. No overlying edema, erythema, increased warmth. No pain with calf compression, swelling, warmth, erythema.  Assessment: Patient presents with symptomatic onychomycosis  Plan: -Treatment options including alternatives, risks, complications were discussed -Nails sharply debrided 10 without complication/bleeding. -Discussed daily foot inspection. If there are any changes, to call the office immediately.  -Follow-up in 3 months or sooner if any problems are to arise. In the meantime, encouraged to call the office with any questions, concerns, changes symptoms.

## 2015-05-02 DIAGNOSIS — M1712 Unilateral primary osteoarthritis, left knee: Secondary | ICD-10-CM | POA: Diagnosis not present

## 2015-05-03 ENCOUNTER — Ambulatory Visit: Payer: Medicare Other | Admitting: Podiatry

## 2015-05-17 ENCOUNTER — Ambulatory Visit: Payer: Medicare Other | Admitting: Endocrinology

## 2015-05-18 DIAGNOSIS — K6389 Other specified diseases of intestine: Secondary | ICD-10-CM | POA: Diagnosis not present

## 2015-05-18 DIAGNOSIS — D120 Benign neoplasm of cecum: Secondary | ICD-10-CM | POA: Diagnosis not present

## 2015-05-18 DIAGNOSIS — Z86010 Personal history of colonic polyps: Secondary | ICD-10-CM | POA: Diagnosis not present

## 2015-05-18 DIAGNOSIS — D121 Benign neoplasm of appendix: Secondary | ICD-10-CM | POA: Diagnosis not present

## 2015-05-18 DIAGNOSIS — I10 Essential (primary) hypertension: Secondary | ICD-10-CM | POA: Diagnosis not present

## 2015-05-18 DIAGNOSIS — K639 Disease of intestine, unspecified: Secondary | ICD-10-CM | POA: Diagnosis not present

## 2015-05-18 DIAGNOSIS — D122 Benign neoplasm of ascending colon: Secondary | ICD-10-CM | POA: Diagnosis not present

## 2015-06-07 DIAGNOSIS — M79676 Pain in unspecified toe(s): Secondary | ICD-10-CM | POA: Diagnosis not present

## 2015-06-07 DIAGNOSIS — M79674 Pain in right toe(s): Secondary | ICD-10-CM | POA: Diagnosis not present

## 2015-06-07 DIAGNOSIS — M2041 Other hammer toe(s) (acquired), right foot: Secondary | ICD-10-CM | POA: Diagnosis not present

## 2015-06-07 DIAGNOSIS — B351 Tinea unguium: Secondary | ICD-10-CM | POA: Diagnosis not present

## 2015-06-21 ENCOUNTER — Ambulatory Visit: Payer: Medicare Other | Admitting: Endocrinology

## 2015-06-26 ENCOUNTER — Ambulatory Visit: Payer: Medicare Other | Admitting: Endocrinology

## 2015-06-27 ENCOUNTER — Other Ambulatory Visit: Payer: Medicare Other | Admitting: Internal Medicine

## 2015-06-29 ENCOUNTER — Ambulatory Visit: Payer: Medicare Other | Admitting: Internal Medicine

## 2015-07-04 ENCOUNTER — Other Ambulatory Visit: Payer: Medicare Other | Admitting: Internal Medicine

## 2015-07-04 DIAGNOSIS — E785 Hyperlipidemia, unspecified: Secondary | ICD-10-CM

## 2015-07-04 DIAGNOSIS — Z79899 Other long term (current) drug therapy: Secondary | ICD-10-CM

## 2015-07-04 LAB — LIPID PANEL
CHOL/HDL RATIO: 2.5 ratio
Cholesterol: 182 mg/dL (ref 0–200)
HDL: 73 mg/dL (ref >=46)
LDL CALC: 99 mg/dL (ref 0–99)
Triglycerides: 49 mg/dL (ref <150)
VLDL: 10 mg/dL (ref 0–40)

## 2015-07-04 LAB — HEPATIC FUNCTION PANEL
ALT: 12 U/L (ref 0–35)
AST: 17 U/L (ref 0–37)
Albumin: 4.2 g/dL (ref 3.5–5.2)
Alkaline Phosphatase: 69 U/L (ref 39–117)
BILIRUBIN DIRECT: 0.2 mg/dL (ref 0.0–0.3)
BILIRUBIN INDIRECT: 0.5 mg/dL (ref 0.2–1.2)
BILIRUBIN TOTAL: 0.7 mg/dL (ref 0.2–1.2)
Total Protein: 7.1 g/dL (ref 6.0–8.3)

## 2015-07-06 ENCOUNTER — Ambulatory Visit (INDEPENDENT_AMBULATORY_CARE_PROVIDER_SITE_OTHER): Payer: Medicare Other | Admitting: Internal Medicine

## 2015-07-06 ENCOUNTER — Encounter: Payer: Self-pay | Admitting: Internal Medicine

## 2015-07-06 VITALS — BP 128/72 | HR 102 | Temp 98.1°F | Wt 182.0 lb

## 2015-07-06 DIAGNOSIS — F411 Generalized anxiety disorder: Secondary | ICD-10-CM | POA: Diagnosis not present

## 2015-07-06 DIAGNOSIS — E8881 Metabolic syndrome: Secondary | ICD-10-CM

## 2015-07-06 DIAGNOSIS — E785 Hyperlipidemia, unspecified: Secondary | ICD-10-CM | POA: Diagnosis not present

## 2015-07-06 DIAGNOSIS — I10 Essential (primary) hypertension: Secondary | ICD-10-CM

## 2015-07-06 DIAGNOSIS — R7302 Impaired glucose tolerance (oral): Secondary | ICD-10-CM

## 2015-07-06 DIAGNOSIS — E669 Obesity, unspecified: Secondary | ICD-10-CM | POA: Diagnosis not present

## 2015-07-06 MED ORDER — ALPRAZOLAM 0.25 MG PO TABS: 0.2500 mg | ORAL_TABLET | Freq: Two times a day (BID) | ORAL | Status: DC | PRN

## 2015-07-07 ENCOUNTER — Other Ambulatory Visit (INDEPENDENT_AMBULATORY_CARE_PROVIDER_SITE_OTHER): Payer: Medicare Other

## 2015-07-07 DIAGNOSIS — E042 Nontoxic multinodular goiter: Secondary | ICD-10-CM | POA: Diagnosis not present

## 2015-07-07 LAB — T4, FREE: FREE T4: 0.97 ng/dL (ref 0.60–1.60)

## 2015-07-12 ENCOUNTER — Ambulatory Visit (INDEPENDENT_AMBULATORY_CARE_PROVIDER_SITE_OTHER): Payer: Medicare Other | Admitting: Endocrinology

## 2015-07-12 ENCOUNTER — Encounter: Payer: Self-pay | Admitting: Endocrinology

## 2015-07-12 VITALS — BP 146/80 | HR 118 | Temp 98.0°F | Resp 16 | Ht 63.25 in | Wt 184.6 lb

## 2015-07-12 DIAGNOSIS — E042 Nontoxic multinodular goiter: Secondary | ICD-10-CM

## 2015-07-12 NOTE — Progress Notes (Signed)
[name], female   DOB: 03-21-1941, 74 y.o.   MRN: [id number]    Reason for Appointment: Goiter, followup    History of Present Illness:   The patient's thyroid enlargement was first discovered in early 2012 when she was having a CT scan of her neck Subsequent ultrasound showed a 4.6 cm left-sided thyroid nodule. Aspiration biopsy of this showed non-neoplastic goiter  Because of the increased size of the left lobe nodule another needle aspiration was done again in 7/14 which was also benign.  Previously because of the tendency for her thyroid nodule to be gradually increasing she was told to try levothyroxine 75 mcg empirically to help with preventing further growth However follow-up TSH in 9/15 was significantly low and levothyroxine was stopped  She has had no difficulty with swallowing recently   Does not feel like she has any choking sensation in her neck or pressure in any position or when lying down.  Her last ultrasound in 04/2013 showed the following: The dominant nodule in the lower pole of the left lobe measures 4.6 x 3.7 x 4.9 cm  Previously, it measured 4.6 x 3.0 x 3.6 cm.  Right lobe nodules are not significantly changed. Right upper pole nodule is 13 x 9 x 9 mm. Right lower pole nodule is 9 x 6 x 8 mm   Lab Results  Component Value Date   FREET4 0.97 07/07/2015   FREET4 1.04 11/07/2014   TSH 0.99 11/07/2014   TSH 0.042* 09/15/2014   TSH 1.297 03/15/2014        Medication List       This list is accurate as of: 07/12/15 11:59 PM.  Always use your most recent med list.               ALPRAZolam 0.25 MG tablet  Commonly known as:  XANAX  Take 1 tablet (0.25 mg total) by mouth 2 (two) times daily as needed for anxiety.     amLODipine 5 MG tablet  Commonly known as:  NORVASC  TAKE 1 TABLET (5 MG TOTAL) BY MOUTH DAILY.     CALTRATE 600+D PLUS 600-400 MG-UNIT per tablet  Chew 1 tablet by mouth daily.     econazole nitrate 1 % cream     Fish Oil 1000 MG Cpdr  Take by mouth.     Garlic 620 MG Tabs  Take 1,000 mg by mouth.     hydrocortisone 2.5 % rectal cream  Commonly known as:  ANUSOL-HC  Place 1 application rectally 2 (two) times daily.     loratadine 10 MG tablet  Commonly known as:  CLARITIN  Take 1 tablet (10 mg total) by mouth daily.     MAXZIDE 75-50 MG per tablet  Generic drug:  triamterene-hydrochlorothiazide  TAKE 1 TABLET EVERY DAY.     multivitamin tablet  Take 1 tablet by mouth daily.     rosuvastatin 5 MG tablet  Commonly known as:  CRESTOR  Take 1 tablet (5 mg total) by mouth daily.     VITAMIN D (CHOLECALCIFEROL) PO  Take by mouth.        Allergies:  Allergies  Allergen Reactions  . Penicillins Other (See Comments)    Chest pain  . Codeine   . Darvon     Past Medical History  Diagnosis Date  . Cancer     cervical  . Hypertension   . Obesity   . Allergy   . Osteopenia   . Hyperlipidemia   .  Vitamin D deficiency     Past Surgical History  Procedure Laterality Date  . Abdominal hysterectomy    . Hammer toe surgery  12/02    right  . Eye surgery      Family History  Problem Relation Age of Onset  . Hypertension Mother   . Heart disease Father   . Hypertension Father     Social History:  reports that she has never smoked. She does not have any smokeless tobacco history on file. She reports that she does not drink alcohol or use illicit drugs.   Review of Systems:  There is a  history of high blood pressure.   She follows up with PCP for blood pressure, today blood pressure is high normal             No  history of Diabetes, has impaired fasting glucose and last A1c 5.8 .        Examination:   BP 146/80 mmHg  Pulse 118  Temp(Src) 98 F (36.7 C)  Resp 16  Ht 5' 3.25" (1.607 m)  Wt 184 lb 9.6 oz (83.734 kg)  BMI 32.42 kg/m2  SpO2 95%                Thyroid exam: She has a  nodule on  the left lobe extending to the isthmus which is mostly palpable on  swallowing.  This is smooth and slightly firm and is about 3-4 cm in size; The lobe is about 3 times normal size Right lobe is about 1.5- 2 times normal, firm and smooth, no distinct nodules felt  There is no lymphadenopathy in the neck .    Pemberton sign negative No stridor Deep tendon reflexes  at biceps are normal.   Assessment/Plan:  Multinodular goiter with dominant left lobe nodule which has been proven benign by biopsy twice This nodule previously has been nearly 5 cm on ultrasound done in 2014 Clinically her thyroid nodule appears stable in size and she is again asymptomatic Unable to try levothyroxine for suppression since she had a suppressed TSH only on 75 g  She will followup annually for reexamination   [facility] 07/13/2015

## 2015-07-30 ENCOUNTER — Encounter: Payer: Self-pay | Admitting: Internal Medicine

## 2015-07-30 NOTE — Patient Instructions (Addendum)
Continue same medications and return in 9 months. Takes Xanax sparingly for anxiety related to situational stress. Wife diet and exercise. Please lose weight.

## 2015-07-30 NOTE — Progress Notes (Signed)
   Subjective:    [name], female    DOB: 1941/07/26, 74 y.o.   MRN: [id number]  HPI  74 year old 55 female in today for follow-up on essential hypertension and hyperlipidemia. Has anxiety. Mother is causing all sorts of problems in nursing home [place]. Husband is a nursing home here with dementia. Blood pressure under good control although her pulse is elevated because she is angry at her mother after a telephone call.  History of impaired glucose tolerance with hemoglobin A1c 3 months ago 5.8%. There has been no change whatsoever in her lipids. Total cholesterol is 226 and LDL cholesterol 153. This is disappointing.  No change in weight from 3 months ago  Review of Systems     Objective:   Physical Exam  Skin warm and dry. Nodes none. Multinodular goiter followed by Dr. Dwyane Dee. Neck is supple. Chest clear. Cardiac exam regular rate and rhythm. Now no tachycardia after calming down. Extremities without edema.      Assessment & Plan:  Essential hypertension  Hyperlipidemia-no change but patient does not want to be on statin therapy. To discuss with endocrinologist when she is seen there.  Multinodular border  Obesity  Metabolic syndrome  Impaired glucose tolerance  Plan: Patient had physical exam in March of this year and should return in 9 months. Given Xanax for anxiety. 25 minutes spent with patient

## 2015-08-02 DIAGNOSIS — B353 Tinea pedis: Secondary | ICD-10-CM | POA: Diagnosis not present

## 2015-08-02 DIAGNOSIS — L820 Inflamed seborrheic keratosis: Secondary | ICD-10-CM | POA: Diagnosis not present

## 2015-08-02 DIAGNOSIS — D171 Benign lipomatous neoplasm of skin and subcutaneous tissue of trunk: Secondary | ICD-10-CM | POA: Diagnosis not present

## 2015-08-16 ENCOUNTER — Ambulatory Visit (INDEPENDENT_AMBULATORY_CARE_PROVIDER_SITE_OTHER): Payer: Medicare Other | Admitting: Podiatry

## 2015-08-16 ENCOUNTER — Encounter: Payer: Self-pay | Admitting: Podiatry

## 2015-08-16 DIAGNOSIS — M79676 Pain in unspecified toe(s): Secondary | ICD-10-CM

## 2015-08-16 DIAGNOSIS — B351 Tinea unguium: Secondary | ICD-10-CM | POA: Diagnosis not present

## 2015-08-17 NOTE — Progress Notes (Signed)
[name], female   DOB: November 04, 1941, 74 y.o.   MRN: [id number]  Subjective: This patient presents today complaining of painful toenails and requests toenail debridement  Objective: The toenails are elongated, brittle, discolored, hypertrophic and tender direct palpation 6-10  Assessment: Symptomatic onychomycoses 6-10  Plan: Debridement toenails 10 mechanically and electrically without any bleeding  Reappoint 3

## 2015-08-25 ENCOUNTER — Encounter: Payer: Self-pay | Admitting: Internal Medicine

## 2015-09-20 DIAGNOSIS — H40023 Open angle with borderline findings, high risk, bilateral: Secondary | ICD-10-CM | POA: Diagnosis not present

## 2015-09-20 DIAGNOSIS — H40033 Anatomical narrow angle, bilateral: Secondary | ICD-10-CM | POA: Diagnosis not present

## 2015-10-24 ENCOUNTER — Encounter: Payer: Self-pay | Admitting: Podiatry

## 2015-10-24 ENCOUNTER — Ambulatory Visit (INDEPENDENT_AMBULATORY_CARE_PROVIDER_SITE_OTHER): Payer: Medicare Other | Admitting: Podiatry

## 2015-10-24 DIAGNOSIS — B351 Tinea unguium: Secondary | ICD-10-CM

## 2015-10-24 DIAGNOSIS — M79676 Pain in unspecified toe(s): Secondary | ICD-10-CM | POA: Diagnosis not present

## 2015-10-24 NOTE — Progress Notes (Signed)
[name], female   DOB: August 14, 1941, 74 y.o.   MRN: [id number]   Subjective: This patient presents for scheduled visit complaining of painful toenails when walking wearing shoes and request nail debridement  Objective: Orientated 3 Keratoses fifth right toe No skin lesions bilaterally The toenails are brittle, elongated, hypertrophic, discolored and tender to direct palpation 6-10  Assessment: Symptomatic onychomycoses 6-10  Plan: Debrided toenails 10 and mechanically and electrically without any bleeding  Reappoint 3 months

## 2015-11-13 ENCOUNTER — Other Ambulatory Visit: Payer: Self-pay | Admitting: Internal Medicine

## 2015-11-20 ENCOUNTER — Other Ambulatory Visit: Payer: Medicare Other | Admitting: Internal Medicine

## 2015-11-20 DIAGNOSIS — E041 Nontoxic single thyroid nodule: Secondary | ICD-10-CM | POA: Diagnosis not present

## 2015-11-20 DIAGNOSIS — E785 Hyperlipidemia, unspecified: Secondary | ICD-10-CM

## 2015-11-20 DIAGNOSIS — E1169 Type 2 diabetes mellitus with other specified complication: Secondary | ICD-10-CM

## 2015-11-20 DIAGNOSIS — I10 Essential (primary) hypertension: Secondary | ICD-10-CM | POA: Diagnosis not present

## 2015-11-20 DIAGNOSIS — E559 Vitamin D deficiency, unspecified: Secondary | ICD-10-CM | POA: Diagnosis not present

## 2015-11-20 DIAGNOSIS — Z0000 Encounter for general adult medical examination without abnormal findings: Secondary | ICD-10-CM | POA: Diagnosis not present

## 2015-11-20 LAB — LIPID PANEL
CHOLESTEROL: 193 mg/dL (ref 125–200)
HDL: 69 mg/dL (ref >=46)
LDL Cholesterol: 114 mg/dL (ref <130)
Total CHOL/HDL Ratio: 2.8 Ratio (ref <=5.0)
Triglycerides: 51 mg/dL (ref <150)
VLDL: 10 mg/dL (ref <30)

## 2015-11-20 LAB — CBC WITH DIFFERENTIAL/PLATELET
BASOS ABS: 0 10*3/uL (ref 0.0–0.1)
BASOS PCT: 0 % (ref 0–1)
Eosinophils Absolute: 0 10*3/uL (ref 0.0–0.7)
Eosinophils Relative: 1 % (ref 0–5)
HEMATOCRIT: 38.9 % (ref 36.0–46.0)
HEMOGLOBIN: 12.9 g/dL (ref 12.0–15.0)
LYMPHS PCT: 22 % (ref 12–46)
Lymphs Abs: 1.1 10*3/uL (ref 0.7–4.0)
MCH: 25.6 pg — ABNORMAL LOW (ref 26.0–34.0)
MCHC: 33.2 g/dL (ref 30.0–36.0)
MCV: 77.3 fL — ABNORMAL LOW (ref 78.0–100.0)
MPV: 8.9 fL (ref 8.6–12.4)
Monocytes Absolute: 0.3 10*3/uL (ref 0.1–1.0)
Monocytes Relative: 6 % (ref 3–12)
NEUTROS ABS: 3.5 10*3/uL (ref 1.7–7.7)
Neutrophils Relative %: 71 % (ref 43–77)
Platelets: 268 10*3/uL (ref 150–400)
RBC: 5.03 MIL/uL (ref 3.87–5.11)
RDW: 16 % — ABNORMAL HIGH (ref 11.5–15.5)
WBC: 4.9 10*3/uL (ref 4.0–10.5)

## 2015-11-20 LAB — COMPLETE METABOLIC PANEL WITH GFR
ALT: 12 U/L (ref 6–29)
AST: 16 U/L (ref 10–35)
Albumin: 4.2 g/dL (ref 3.6–5.1)
Alkaline Phosphatase: 79 U/L (ref 33–130)
BUN: 21 mg/dL (ref 7–25)
CALCIUM: 10 mg/dL (ref 8.6–10.4)
CO2: 29 mmol/L (ref 20–31)
Chloride: 101 mmol/L (ref 98–110)
Creat: 0.68 mg/dL (ref 0.60–0.93)
GFR, Est African American: >89 mL/min (ref >=60)
GFR, Est Non African American: 88 mL/min (ref >=60)
Glucose, Bld: 90 mg/dL (ref 65–99)
POTASSIUM: 3.9 mmol/L (ref 3.5–5.3)
SODIUM: 139 mmol/L (ref 135–146)
Total Bilirubin: 1 mg/dL (ref 0.2–1.2)
Total Protein: 7.3 g/dL (ref 6.1–8.1)

## 2015-11-20 LAB — HEMOGLOBIN A1C
HEMOGLOBIN A1C: 5.7 % — AB (ref <5.7)
MEAN PLASMA GLUCOSE: 117 mg/dL — AB (ref <117)

## 2015-11-20 LAB — TSH: TSH: 1.007 u[IU]/mL (ref 0.350–4.500)

## 2015-11-21 LAB — VITAMIN D 25 HYDROXY (VIT D DEFICIENCY, FRACTURES): VIT D 25 HYDROXY: 35 ng/mL (ref 30–100)

## 2015-11-21 LAB — MICROALBUMIN, URINE: MICROALB UR: 0.4 mg/dL

## 2015-11-22 DIAGNOSIS — Z1231 Encounter for screening mammogram for malignant neoplasm of breast: Secondary | ICD-10-CM | POA: Diagnosis not present

## 2015-11-28 ENCOUNTER — Encounter: Payer: Self-pay | Admitting: Internal Medicine

## 2015-11-28 ENCOUNTER — Ambulatory Visit (INDEPENDENT_AMBULATORY_CARE_PROVIDER_SITE_OTHER): Payer: Medicare Other | Admitting: Internal Medicine

## 2015-11-28 VITALS — BP 130/64 | HR 100 | Temp 97.9°F | Resp 20 | Ht 63.0 in | Wt 180.5 lb

## 2015-11-28 DIAGNOSIS — E049 Nontoxic goiter, unspecified: Secondary | ICD-10-CM | POA: Diagnosis not present

## 2015-11-28 DIAGNOSIS — Z23 Encounter for immunization: Secondary | ICD-10-CM | POA: Diagnosis not present

## 2015-11-28 DIAGNOSIS — M25562 Pain in left knee: Secondary | ICD-10-CM

## 2015-11-28 DIAGNOSIS — Z0000 Encounter for general adult medical examination without abnormal findings: Secondary | ICD-10-CM | POA: Diagnosis not present

## 2015-11-28 DIAGNOSIS — E669 Obesity, unspecified: Secondary | ICD-10-CM

## 2015-11-28 DIAGNOSIS — I10 Essential (primary) hypertension: Secondary | ICD-10-CM

## 2015-11-28 DIAGNOSIS — F411 Generalized anxiety disorder: Secondary | ICD-10-CM | POA: Diagnosis not present

## 2015-11-28 DIAGNOSIS — E785 Hyperlipidemia, unspecified: Secondary | ICD-10-CM | POA: Diagnosis not present

## 2015-11-28 DIAGNOSIS — J309 Allergic rhinitis, unspecified: Secondary | ICD-10-CM

## 2015-11-28 DIAGNOSIS — M170 Bilateral primary osteoarthritis of knee: Secondary | ICD-10-CM

## 2015-11-28 DIAGNOSIS — Z8541 Personal history of malignant neoplasm of cervix uteri: Secondary | ICD-10-CM

## 2015-11-28 DIAGNOSIS — E041 Nontoxic single thyroid nodule: Secondary | ICD-10-CM

## 2015-11-28 LAB — POCT URINALYSIS DIPSTICK
BILIRUBIN UA: NEGATIVE
Glucose, UA: NEGATIVE
KETONES UA: NEGATIVE
Leukocytes, UA: NEGATIVE
Nitrite, UA: NEGATIVE
PH UA: 7
Protein, UA: NEGATIVE
RBC UA: NEGATIVE
SPEC GRAV UA: 1.025
Urobilinogen, UA: 0.2

## 2015-11-28 MED ORDER — ROSUVASTATIN CALCIUM 5 MG PO TABS: 5.0000 mg | ORAL_TABLET | Freq: Every day | ORAL | Status: DC

## 2015-11-28 MED ORDER — CLOTRIMAZOLE-BETAMETHASONE 1-0.05 % EX CREA: 1.0000 "application " | TOPICAL_CREAM | Freq: Two times a day (BID) | CUTANEOUS | Status: DC

## 2015-11-28 MED ORDER — MAXZIDE 75-50 MG PO TABS: ORAL_TABLET | ORAL | Status: DC

## 2015-11-28 MED ORDER — MUPIROCIN 2 % EX OINT: TOPICAL_OINTMENT | CUTANEOUS | Status: DC

## 2015-11-28 NOTE — Patient Instructions (Addendum)
Continue same meds and RTC in 6 months. Flu vaccine given today. Prevnar to be ordered. Return to see orthopedist regarding left knee pain.

## 2015-11-29 DIAGNOSIS — H1132 Conjunctival hemorrhage, left eye: Secondary | ICD-10-CM | POA: Diagnosis not present

## 2015-11-29 DIAGNOSIS — H04123 Dry eye syndrome of bilateral lacrimal glands: Secondary | ICD-10-CM | POA: Diagnosis not present

## 2015-11-29 NOTE — Progress Notes (Signed)
Subjective:    [name], female    DOB: 26-Apr-1941, 74 y.o.   MRN: [id number]  HPI 74 year old Black Female in today for health maintenance exam and evaluation of medical illnesses. Flu vaccine given today. Needs Prevnar in the near future. Patient has had cortisone injection left knee about 6 months ago [place]. Continues to have pain in that knee. May want to return to see about another steroid injection or other treatment/evaluation such as an MRI.  She has a history of obesity, hyperlipidemia, allergic rhinitis, benign colon polyp, primary osteoarthritis of both knees, hypertension, goiter and thyroid nodule followed by Dr. Dwyane Dee.  Colon polyp was sessile and was removed at [facility] and was found to be benign. She is on thyroid suppression by Dr. Dwyane Dee for border.  History of hemorrhoids and rectal itching for which she uses clotrimazole. Has been on Maxide 75/50 since 1999 for hypertension. In 2010 amlodipine was added. History of anxiety and depression in 1998.  Hysterectomy without oophorectomy for cervical cancer in 1977 in [place].  Dr. Barbaraann Share and now does GYN exams. Right hammertoe surgery 2002. History of vitamin D deficiency. She is intolerant of penicillincauses chest pain. Says she is intolerant of codeine.  Social history: She is married. Husband is in a nursing home with dementia. This is been stressful. 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 No children. Husband is a retired Firefighter.  Family history: Mother with history of hypertension and dementia still living in [place]. One sister lives in [place]. Father deceased with history of diabetes and hypertension.      Review of Systems main complaint is left knee pain     Objective:   Physical Exam  Constitutional: She is oriented to person, place, and time. She appears well-developed and  well-nourished. No distress.  HENT:  Head: Normocephalic and atraumatic.  Right Ear: External ear normal.  Left Ear: External ear normal.  Mouth/Throat: Oropharynx is clear and moist. No oropharyngeal exudate.  Eyes: Conjunctivae and EOM are normal. Pupils are equal, round, and reactive to light. Right eye exhibits no discharge. Left eye exhibits no discharge. No scleral icterus.  Neck: Neck supple. No JVD present. No thyromegaly present.  Cardiovascular: Normal rate, regular rhythm, normal heart sounds and intact distal pulses.   No murmur heard. Pulmonary/Chest: Effort normal and breath sounds normal. No respiratory distress. She has no wheezes. She has no rales. She exhibits no tenderness.  Abdominal: Soft. Bowel sounds are normal. She exhibits no distension and no mass. There is no tenderness. There is no rebound and no guarding.  Genitourinary:  Deferred to GYN  Musculoskeletal: She exhibits no edema.  Left knee pops with flexion. Tender left lateral collateral ligament.  Lymphadenopathy:    She has no cervical adenopathy.  Neurological: She is alert and oriented to person, place, and time. She has normal reflexes. No cranial nerve deficit. Coordination normal.  Skin: No rash noted. She is not diaphoretic.  Psychiatric: She has a normal mood and affect. Her behavior is normal. Judgment and thought content normal.  Vitals reviewed.         Assessment & Plan:  Essential hypertension-stable on medication  Hyperlipidemia-lipid panel normal  Allergic rhinitis  Left knee pain-possible internal derangement  Primary osteoarthritis both knees  Obesity continue diet and exercise efforts. Hemoglobin A1c 5.7%  History of benign colon polyp  History of cervical cancer  History of anxiety  Plan: Flu vaccine given today. Prevnar to be given in the near future. Continue same medications and return in 6 months.  Subjective:   Patient presents for Medicare Annual/Subsequent  preventive examination.  Review Past Medical/Family/Social: See above   Risk Factors  Current exercise habits: Sedentary due to knee pain Dietary issues discussed: Low fat low carbohydrate  Cardiac risk factors: Hyperlipidemia  Depression Screen  (Note: if answer to either of the following is "Yes", a more complete depression screening is indicated)   Over the past two weeks, have you felt down, depressed or hopeless? No  Over the past two weeks, have you felt little interest or pleasure in doing things? No Have you lost interest or pleasure in daily life? No Do you often feel hopeless? No Do you cry easily over simple problems? No   Activities of Daily Living  In your present state of health, do you have any difficulty performing the following activities?:   Driving? No  Managing money? No  Feeding yourself? No  Getting from bed to chair? No  Climbing a flight of stairs? No  Preparing food and eating?: No  Bathing or showering? No  Getting dressed: No  Getting to the toilet? No  Using the toilet:No  Moving around from place to place: No  In the past year have you fallen or had a near fall?: Yes tripped over some boxes recently at my house Are you sexually active? No  Do you have more than one partner? No   Hearing Difficulties: No  Do you often ask people to speak up or repeat themselves? No  Do you experience ringing or noises in your ears? No  Do you have difficulty understanding soft or whispered voices? No  Do you feel that you have a problem with memory? No Do you often misplace items? No    Home Safety:  Do you have a smoke alarm at your residence? Yes Do you have grab bars in the bathroom? Yes Do you have throw rugs in your house? No   Cognitive Testing  Alert? Yes Normal Appearance?Yes  Oriented to person? Yes Place? Yes  Time? Yes  Recall of three objects? Yes  Can perform simple calculations? Yes  Displays appropriate judgment?Yes  Can read the  correct time from a watch face?Yes   List the Names of Other Physician/Practitioners you currently use:  See referral list for the physicians patient is currently seeing.  Dr. [facility]  [facility]   Review of Systems: See above   Objective:     General appearance: Appears stated age and obese  Head: Normocephalic, without obvious abnormality, atraumatic  Eyes: conj clear, EOMi PEERLA  Ears: normal TM's and external ear canals both ears  Nose: Nares normal. Septum midline. Mucosa normal. No drainage or sinus tenderness.  Throat: lips, mucosa, and tongue normal; teeth and gums normal  Neck: no adenopathy, no carotid bruit, no JVD, supple, symmetrical, trachea midline and thyroid not enlarged, symmetric, no tenderness/mass/nodules  No CVA tenderness.  Lungs: clear to auscultation bilaterally  Breasts: normal appearance, no masses or tenderness Heart: regular rate and rhythm, S1, S2 normal, no murmur, click, rub or gallop  Abdomen: soft, non-tender; bowel sounds normal; no masses, no organomegaly  Musculoskeletal: ROM normal in all joints, no crepitus, no deformity, Normal muscle strengthen. Back  is symmetric, no curvature. Skin: Skin color, texture, turgor normal. No rashes or lesions  Lymph nodes: Cervical, supraclavicular, and axillary nodes normal.  Neurologic: CN 2 -  12 Normal, Normal symmetric reflexes. Normal coordination and gait  Psych: Alert & Oriented x 3, Mood appear stable.    Assessment:    Annual wellness medicare exam   Plan:    During the course of the visit the patient was educated and counseled about appropriate screening and preventive services including:   Annual mammogram  Flu vaccine-given  Prevnar-to be given early December     Patient Instructions (the written plan) was given to the patient.  Medicare Attestation  I have personally reviewed:  The patient's medical and social history  Their use of alcohol, tobacco or illicit  drugs  Their current medications and supplements  The patient's functional ability including ADLs,fall risks, home safety risks, cognitive, and hearing and visual impairment  Diet and physical activities  Evidence for depression or mood disorders  The patient's weight, height, BMI, and visual acuity have been recorded in the chart. I have made referrals, counseling, and provided education to the patient based on review of the above and I have provided the patient with a written personalized care plan for preventive services.

## 2015-12-05 DIAGNOSIS — H1013 Acute atopic conjunctivitis, bilateral: Secondary | ICD-10-CM | POA: Diagnosis not present

## 2015-12-05 DIAGNOSIS — H40033 Anatomical narrow angle, bilateral: Secondary | ICD-10-CM | POA: Diagnosis not present

## 2015-12-05 DIAGNOSIS — H2513 Age-related nuclear cataract, bilateral: Secondary | ICD-10-CM | POA: Diagnosis not present

## 2015-12-05 DIAGNOSIS — H25013 Cortical age-related cataract, bilateral: Secondary | ICD-10-CM | POA: Diagnosis not present

## 2015-12-06 ENCOUNTER — Ambulatory Visit: Payer: Medicare Other | Admitting: Internal Medicine

## 2015-12-07 ENCOUNTER — Ambulatory Visit (INDEPENDENT_AMBULATORY_CARE_PROVIDER_SITE_OTHER): Payer: Medicare Other | Admitting: Internal Medicine

## 2015-12-07 VITALS — BP 142/72

## 2015-12-07 DIAGNOSIS — Z23 Encounter for immunization: Secondary | ICD-10-CM | POA: Diagnosis not present

## 2015-12-28 DIAGNOSIS — M79675 Pain in left toe(s): Secondary | ICD-10-CM | POA: Diagnosis not present

## 2015-12-28 DIAGNOSIS — M79674 Pain in right toe(s): Secondary | ICD-10-CM | POA: Diagnosis not present

## 2015-12-28 DIAGNOSIS — L84 Corns and callosities: Secondary | ICD-10-CM | POA: Diagnosis not present

## 2015-12-28 DIAGNOSIS — I739 Peripheral vascular disease, unspecified: Secondary | ICD-10-CM | POA: Diagnosis not present

## 2015-12-28 DIAGNOSIS — B351 Tinea unguium: Secondary | ICD-10-CM | POA: Diagnosis not present

## 2016-01-16 ENCOUNTER — Other Ambulatory Visit: Payer: Self-pay

## 2016-01-16 MED ORDER — AMLODIPINE BESYLATE 5 MG PO TABS: ORAL_TABLET | ORAL | Status: DC

## 2016-01-16 MED ORDER — MAXZIDE 75-50 MG PO TABS: ORAL_TABLET | ORAL | Status: DC

## 2016-01-26 DIAGNOSIS — C539 Malignant neoplasm of cervix uteri, unspecified: Secondary | ICD-10-CM | POA: Diagnosis not present

## 2016-01-26 DIAGNOSIS — Z124 Encounter for screening for malignant neoplasm of cervix: Secondary | ICD-10-CM | POA: Diagnosis not present

## 2016-01-26 DIAGNOSIS — Z01419 Encounter for gynecological examination (general) (routine) without abnormal findings: Secondary | ICD-10-CM | POA: Diagnosis not present

## 2016-01-30 ENCOUNTER — Ambulatory Visit: Payer: Medicare Other | Admitting: Podiatry

## 2016-02-21 ENCOUNTER — Encounter: Payer: Self-pay | Admitting: Podiatry

## 2016-02-21 ENCOUNTER — Ambulatory Visit (INDEPENDENT_AMBULATORY_CARE_PROVIDER_SITE_OTHER): Payer: Medicare Other | Admitting: Podiatry

## 2016-02-21 DIAGNOSIS — M79676 Pain in unspecified toe(s): Secondary | ICD-10-CM | POA: Diagnosis not present

## 2016-02-21 DIAGNOSIS — B351 Tinea unguium: Secondary | ICD-10-CM | POA: Diagnosis not present

## 2016-02-22 NOTE — Progress Notes (Signed)
[name], female   DOB: 01-02-1941, 75 y.o.   MRN: [id number]   Subjective: This patient presents for scheduled visit complaining of painful toenails when walking wearing shoes and request nail debridement  Objective: Orientated 3 Keratoses fifth right toe No open skin lesions bilaterally The toenails are brittle, elongated, hypertrophic, discolored and tender to direct palpation 6-10  Assessment: Symptomatic onychomycoses 6-10  History of new onset of type  2 diabetes  Plan: Debrided toenails 10 and mechanically and electrically without any bleeding  Reappoint 3 months

## 2016-03-27 DIAGNOSIS — H40033 Anatomical narrow angle, bilateral: Secondary | ICD-10-CM | POA: Diagnosis not present

## 2016-03-27 DIAGNOSIS — H1013 Acute atopic conjunctivitis, bilateral: Secondary | ICD-10-CM | POA: Diagnosis not present

## 2016-04-10 ENCOUNTER — Telehealth: Payer: Self-pay

## 2016-04-11 NOTE — Telephone Encounter (Signed)
Erogenous   

## 2016-05-15 ENCOUNTER — Telehealth: Payer: Self-pay | Admitting: Internal Medicine

## 2016-05-15 DIAGNOSIS — B351 Tinea unguium: Secondary | ICD-10-CM | POA: Diagnosis not present

## 2016-05-15 DIAGNOSIS — I739 Peripheral vascular disease, unspecified: Secondary | ICD-10-CM | POA: Diagnosis not present

## 2016-05-15 DIAGNOSIS — M79674 Pain in right toe(s): Secondary | ICD-10-CM | POA: Diagnosis not present

## 2016-05-15 DIAGNOSIS — L84 Corns and callosities: Secondary | ICD-10-CM | POA: Diagnosis not present

## 2016-05-15 DIAGNOSIS — M79675 Pain in left toe(s): Secondary | ICD-10-CM | POA: Diagnosis not present

## 2016-05-15 DIAGNOSIS — Z029 Encounter for administrative examinations, unspecified: Secondary | ICD-10-CM

## 2016-05-15 NOTE — Telephone Encounter (Signed)
Patient's mother passed away. She's been in [place] for 3 weeks. Has run out of Maxide 75/50 needs prescription called to [place] telephone number [phone]. Will phone in #30 tablets.

## 2016-05-22 ENCOUNTER — Ambulatory Visit: Payer: Medicare Other | Admitting: Podiatry

## 2016-07-08 ENCOUNTER — Other Ambulatory Visit (INDEPENDENT_AMBULATORY_CARE_PROVIDER_SITE_OTHER): Payer: Medicare Other

## 2016-07-08 DIAGNOSIS — E042 Nontoxic multinodular goiter: Secondary | ICD-10-CM | POA: Diagnosis not present

## 2016-07-08 LAB — TSH: TSH: 0.79 u[IU]/mL (ref 0.35–4.50)

## 2016-07-10 ENCOUNTER — Ambulatory Visit: Payer: Medicare Other | Admitting: Podiatry

## 2016-07-11 ENCOUNTER — Encounter: Payer: Self-pay | Admitting: Endocrinology

## 2016-07-11 ENCOUNTER — Ambulatory Visit (INDEPENDENT_AMBULATORY_CARE_PROVIDER_SITE_OTHER): Payer: Medicare Other | Admitting: Endocrinology

## 2016-07-11 VITALS — BP 132/62 | HR 117 | Ht 63.0 in | Wt 173.0 lb

## 2016-07-11 DIAGNOSIS — E042 Nontoxic multinodular goiter: Secondary | ICD-10-CM | POA: Diagnosis not present

## 2016-07-11 NOTE — Progress Notes (Signed)
[name], female   DOB: [birth year], 75 y.o.   MRN: [id number]    Reason for Appointment: Goiter, followup    History of Present Illness:   The patient's thyroid enlargement was first discovered in early 2012 when she was having a CT scan of her neck Subsequent ultrasound showed a 4.6 cm left-sided thyroid nodule. Aspiration biopsy of this showed non-neoplastic goiter  Because of the increased size of the left lobe nodule another needle aspiration was done again in 7/14 which was also benign.  Previously because of the tendency for her thyroid nodule to be gradually increasing she was told to try levothyroxine 75 mcg empirically to help with preventing further growth However follow-up TSH in 9/15 was significantly low and levothyroxine was stopped  She has had no difficulty with swallowing    Does not feel like she has any choking sensation in her neck or pressure in any position or when lying down. Thyroid levels are normal again  Her last ultrasound in 04/2013 showed the following: The dominant nodule in the lower pole of the left lobe measures 4.6 x 3.7 x 4.9 cm  Previously, it measured 4.6 x 3.0 x 3.6 cm.  Right lobe nodules are not significantly changed. Right upper pole nodule is 13 x 9 x 9 mm. Right lower pole nodule is 9 x 6 x 8 mm   Lab Results  Component Value Date   FREET4 0.97 07/07/2015   FREET4 1.04 11/07/2014   TSH 0.79 07/08/2016   TSH 1.007 11/20/2015   TSH 0.99 11/07/2014        Medication List       This list is accurate as of: 07/11/16  3:06 PM.  Always use your most recent med list.               ALPRAZolam 0.25 MG tablet  Commonly known as:  XANAX  Take 1 tablet (0.25 mg total) by mouth 2 (two) times daily as needed for anxiety.     amLODipine 5 MG tablet  Commonly known as:  NORVASC  TAKE 1 TABLET (5 MG TOTAL) BY MOUTH DAILY.     CALTRATE 600+D PLUS 600-400 MG-UNIT per tablet  Chew 1 tablet by mouth daily.       clotrimazole-betamethasone cream  Commonly known as:  LOTRISONE  Apply 1 application topically 2 (two) times daily.     econazole nitrate 1 % cream  Reported on 07/11/2016     Fish Oil 1000 MG Cpdr  Take by mouth.     Garlic 123XX123 MG Tabs  Take 1,000 mg by mouth.     loratadine 10 MG tablet  Commonly known as:  CLARITIN  Take 1 tablet (10 mg total) by mouth daily.     MAXZIDE 75-50 MG tablet  Generic drug:  triamterene-hydrochlorothiazide  TAKE 1 TABLET EVERY DAY.     multivitamin tablet  Take 1 tablet by mouth daily.     mupirocin ointment 2 %  Commonly known as:  BACTROBAN  Use on burn twice a day     rosuvastatin 5 MG tablet  Commonly known as:  CRESTOR  Take 1 tablet (5 mg total) by mouth daily.     VITAMIN D (CHOLECALCIFEROL) PO  Take by mouth.        Allergies:  Allergies  Allergen Reactions  . Penicillins Other (See Comments)    Chest pain  . Codeine   . Darvon     Past Medical  History  Diagnosis Date  . Cancer (HCC)     cervical  . Hypertension   . Obesity   . Allergy   . Osteopenia   . Hyperlipidemia   . Vitamin D deficiency     Past Surgical History  Procedure Laterality Date  . Abdominal hysterectomy    . Hammer toe surgery  12/02    right  . Eye surgery      Family History  Problem Relation Age of Onset  . Hypertension Mother   . Heart disease Father   . Hypertension Father   . Thyroid disease Neg Hx     Social History:  reports that she has never smoked. She does not have any smokeless tobacco history on file. She reports that she does not drink alcohol or use illicit drugs.   Review of Systems:  There is a  history of high blood pressure Followed by PCP .               No  history of Diabetes, has impaired fasting glucose and last A1c 5.8 .        Examination:   BP 132/62 mmHg  Pulse 117  Ht 5\' 3"  (1.6 m)  Wt 173 lb (78.472 kg)  BMI 30.65 kg/m2  SpO2 96%                Thyroid exam: She has a nodule on  the  left lobe Medially which is mostly palpable on swallowing.  This is smooth and slightly firm and is about 4 cm in size, difficult to estimate Right lobe is about 1.5 times normal, firm and smooth, no distinct nodules felt  There is no lymphadenopathy in the neck .     Deep tendon reflexes  at biceps are normal.   Assessment/Plan:  Multinodular goiter with dominant left lobe nodule which has been proven benign by biopsy twice This nodule previously has been nearly 5 cm on ultrasound done in 2014 Clinically her thyroid nodule does not appear to be significantly larger She is asymptomatic  Unable to try levothyroxine for suppression since she had a suppressed TSH only on 75 g and currently TSH is below 1.0  She will followup annually for evaluation   [facility] 07/11/2016

## 2016-07-24 ENCOUNTER — Encounter: Payer: Self-pay | Admitting: Podiatry

## 2016-07-24 ENCOUNTER — Telehealth: Payer: Self-pay | Admitting: Internal Medicine

## 2016-07-24 ENCOUNTER — Ambulatory Visit (INDEPENDENT_AMBULATORY_CARE_PROVIDER_SITE_OTHER): Payer: Medicare Other | Admitting: Podiatry

## 2016-07-24 DIAGNOSIS — M79676 Pain in unspecified toe(s): Secondary | ICD-10-CM | POA: Diagnosis not present

## 2016-07-24 DIAGNOSIS — B351 Tinea unguium: Secondary | ICD-10-CM

## 2016-07-24 NOTE — Progress Notes (Signed)
   Subjective:    [name], female    DOB: [birth year], 75 y.o.   MRN: [id number]  HPI  Patient presents today complaining of uncomfortable toenails when walking wearing shoes and requests toenail debridement     Review of Systems  All other systems reviewed and are negative.      Objective:   Physical Exam  Objective: Orientated 3 Keratoses fifth right toe No open skin lesions bilaterally The toenails are brittle, elongated, hypertrophic, discolored and tender to direct palpation 6-10      Assessment & Plan:    Assessment: Symptomatic onychomycoses 6-10  History of new onset of type  2 diabetes  Plan: Debrided toenails 10 and mechanically and electrically without any bleeding  Reappoint 3 months

## 2016-07-24 NOTE — Telephone Encounter (Signed)
Patient calls stating that she received a call from [facility] in [place] [phone]) stating that they have a refill on her Traimterine for her.  Patient states that she doesn't know what this medication is for.  States that she has been on Maxide 75-50 and Norvasc 5mg  for years and she doesn't know what this is about.  She thinks they have her confused with someone else and she is wanting to get this straightened out.  So, she wants [place] to get it straightened out for her.  She has [facility] on her list as pharmacy.  Advised that [facility] [place] when she goes to [place].    Spoke with Dr. Renold Genta and she advised that Triamterine is part of Maxide.  Maxide if Traimterine and HCTZ.  She advised to call the patient back and let her know that this is what the medication is.    Left voice mail for the patient to this effect.  Patient is to call the [facility] [place] and let them know to cancel the auto refill that they have her on.  She lives here in [place] and visits [place].  They apparently have her on AUTO refill from where something was called in to [place] while she was up there when her Mother passed away recently.  Patient was instructed to call us back if she has any further questions in this regard.

## 2016-07-24 NOTE — Patient Instructions (Signed)
Diabetes and Foot Care Diabetes may cause you to have problems because of poor blood supply (circulation) to your feet and legs. This may cause the skin on your feet to become thinner, break easier, and heal more slowly. Your skin may become dry, and the skin may peel and crack. You may also have nerve damage in your legs and feet causing decreased feeling in them. You may not notice minor injuries to your feet that could lead to infections or more serious problems. Taking care of your feet is one of the most important things you can do for yourself.  HOME CARE INSTRUCTIONS  Wear shoes at all times, even in the house. Do not go barefoot. Bare feet are easily injured.  Check your feet daily for blisters, cuts, and redness. If you cannot see the bottom of your feet, use a mirror or ask someone for help.  Wash your feet with warm water (do not use hot water) and mild soap. Then pat your feet and the areas between your toes until they are completely dry. Do not soak your feet as this can dry your skin.  Apply a moisturizing lotion or petroleum jelly (that does not contain alcohol and is unscented) to the skin on your feet and to dry, brittle toenails. Do not apply lotion between your toes.  Trim your toenails straight across. Do not dig under them or around the cuticle. File the edges of your nails with an emery board or nail file.  Do not cut corns or calluses or try to remove them with medicine.  Wear clean socks or stockings every day. Make sure they are not too tight. Do not wear knee-high stockings since they may decrease blood flow to your legs.  Wear shoes that fit properly and have enough cushioning. To break in new shoes, wear them for just a few hours a day. This prevents you from injuring your feet. Always look in your shoes before you put them on to be sure there are no objects inside.  Do not cross your legs. This may decrease the blood flow to your feet.  If you find a minor scrape,  cut, or break in the skin on your feet, keep it and the skin around it clean and dry. These areas may be cleansed with mild soap and water. Do not cleanse the area with peroxide, alcohol, or iodine.  When you remove an adhesive bandage, be sure not to damage the skin around it.  If you have a wound, look at it several times a day to make sure it is healing.  Do not use heating pads or hot water bottles. They may burn your skin. If you have lost feeling in your feet or legs, you may not know it is happening until it is too late.  Make sure your health care provider performs a complete foot exam at least annually or more often if you have foot problems. Report any cuts, sores, or bruises to your health care provider immediately. SEEK MEDICAL CARE IF:   You have an injury that is not healing.  You have cuts or breaks in the skin.  You have an ingrown nail.  You notice redness on your legs or feet.  You feel burning or tingling in your legs or feet.  You have pain or cramps in your legs and feet.  Your legs or feet are numb.  Your feet always feel cold. SEEK IMMEDIATE MEDICAL CARE IF:   There is increasing redness,   swelling, or pain in or around a wound.  There is a red line that goes up your leg.  Pus is coming from a wound.  You develop a fever or as directed by your health care provider.  You notice a bad smell coming from an ulcer or wound.   This information is not intended to replace advice given to you by your health care provider. Make sure you discuss any questions you have with your health care provider.   Document Released: 12/13/2000 Document Revised: 08/18/2013 Document Reviewed: 05/25/2013 Elsevier Interactive Patient Education 2016 [employer]vier Inc.  

## 2016-09-03 ENCOUNTER — Other Ambulatory Visit: Payer: Self-pay

## 2016-09-16 ENCOUNTER — Other Ambulatory Visit: Payer: Self-pay | Admitting: Internal Medicine

## 2016-10-22 ENCOUNTER — Encounter: Payer: Self-pay | Admitting: Podiatry

## 2016-10-22 ENCOUNTER — Ambulatory Visit (INDEPENDENT_AMBULATORY_CARE_PROVIDER_SITE_OTHER): Payer: Medicare Other | Admitting: Podiatry

## 2016-10-22 VITALS — BP 156/85 | HR 96 | Resp 14

## 2016-10-22 DIAGNOSIS — E119 Type 2 diabetes mellitus without complications: Secondary | ICD-10-CM

## 2016-10-22 DIAGNOSIS — L84 Corns and callosities: Secondary | ICD-10-CM | POA: Diagnosis not present

## 2016-10-22 DIAGNOSIS — M79676 Pain in unspecified toe(s): Secondary | ICD-10-CM

## 2016-10-22 DIAGNOSIS — B351 Tinea unguium: Secondary | ICD-10-CM

## 2016-10-22 NOTE — Progress Notes (Signed)
[name], female   DOB: [birth year], 75 y.o.   MRN: [id number]   Subjective: This patient presents day for schedule visit complaining of uncomfortable toenails walking wearing shoes and a painful corn on the fifth right toe. Patient has a more recent diagnosis of diabetes  Objective: Orientated 3 DP and PT pulses 2/4 bilaterally Capillary reflex immediate bilaterally Sensation to 10 g monofilament wire intact 5/5 bilaterally Vibratory sensation reactive bilaterally Ankle reflex equal reactive bilaterally No open skin lesions bilaterally Well-organized corn lateral fifth right toe The toenails are elongated, brittle, deformed, discolored and tender direct palpation 6-10 Pes planus bilaterally HAV bilaterally Manual motor testing: Dorsi flexion, plantar flexion, inversion, eversion 5/5 bilaterally  Assessment: Diabetic without complications Symptomatic mycotic toenails 6-10 Keratoses 1  Plan: Debridement toenails 6-10 mechanically and electronically without any bleeding Debrided keratoses 1 without any bleeding  Reappoint 3 months

## 2016-10-22 NOTE — Patient Instructions (Signed)
Diabetes and Foot Care Diabetes may cause you to have problems because of poor blood supply (circulation) to your feet and legs. This may cause the skin on your feet to become thinner, break easier, and heal more slowly. Your skin may become dry, and the skin may peel and crack. You may also have nerve damage in your legs and feet causing decreased feeling in them. You may not notice minor injuries to your feet that could lead to infections or more serious problems. Taking care of your feet is one of the most important things you can do for yourself.  HOME CARE INSTRUCTIONS  Wear shoes at all times, even in the house. Do not go barefoot. Bare feet are easily injured.  Check your feet daily for blisters, cuts, and redness. If you cannot see the bottom of your feet, use a mirror or ask someone for help.  Wash your feet with warm water (do not use hot water) and mild soap. Then pat your feet and the areas between your toes until they are completely dry. Do not soak your feet as this can dry your skin.  Apply a moisturizing lotion or petroleum jelly (that does not contain alcohol and is unscented) to the skin on your feet and to dry, brittle toenails. Do not apply lotion between your toes.  Trim your toenails straight across. Do not dig under them or around the cuticle. File the edges of your nails with an emery board or nail file.  Do not cut corns or calluses or try to remove them with medicine.  Wear clean socks or stockings every day. Make sure they are not too tight. Do not wear knee-high stockings since they may decrease blood flow to your legs.  Wear shoes that fit properly and have enough cushioning. To break in new shoes, wear them for just a few hours a day. This prevents you from injuring your feet. Always look in your shoes before you put them on to be sure there are no objects inside.  Do not cross your legs. This may decrease the blood flow to your feet.  If you find a minor scrape,  cut, or break in the skin on your feet, keep it and the skin around it clean and dry. These areas may be cleansed with mild soap and water. Do not cleanse the area with peroxide, alcohol, or iodine.  When you remove an adhesive bandage, be sure not to damage the skin around it.  If you have a wound, look at it several times a day to make sure it is healing.  Do not use heating pads or hot water bottles. They may burn your skin. If you have lost feeling in your feet or legs, you may not know it is happening until it is too late.  Make sure your health care provider performs a complete foot exam at least annually or more often if you have foot problems. Report any cuts, sores, or bruises to your health care provider immediately. SEEK MEDICAL CARE IF:   You have an injury that is not healing.  You have cuts or breaks in the skin.  You have an ingrown nail.  You notice redness on your legs or feet.  You feel burning or tingling in your legs or feet.  You have pain or cramps in your legs and feet.  Your legs or feet are numb.  Your feet always feel cold. SEEK IMMEDIATE MEDICAL CARE IF:   There is increasing redness,   swelling, or pain in or around a wound.  There is a red line that goes up your leg.  Pus is coming from a wound.  You develop a fever or as directed by your health care provider.  You notice a bad smell coming from an ulcer or wound.   This information is not intended to replace advice given to you by your health care provider. Make sure you discuss any questions you have with your health care provider.   Document Released: 12/13/2000 Document Revised: 08/18/2013 Document Reviewed: 05/25/2013 Elsevier Interactive Patient Education 2016 [employer]vier Inc.  

## 2016-10-30 ENCOUNTER — Ambulatory Visit: Payer: Medicare Other | Admitting: Podiatry

## 2016-11-04 ENCOUNTER — Ambulatory Visit: Payer: Medicare Other | Admitting: Internal Medicine

## 2016-11-05 ENCOUNTER — Ambulatory Visit (INDEPENDENT_AMBULATORY_CARE_PROVIDER_SITE_OTHER): Payer: Medicare Other

## 2016-11-05 DIAGNOSIS — Z23 Encounter for immunization: Secondary | ICD-10-CM | POA: Diagnosis not present

## 2016-11-14 ENCOUNTER — Other Ambulatory Visit: Payer: Self-pay | Admitting: Internal Medicine

## 2016-11-15 DIAGNOSIS — H1131 Conjunctival hemorrhage, right eye: Secondary | ICD-10-CM | POA: Diagnosis not present

## 2016-11-15 DIAGNOSIS — H04123 Dry eye syndrome of bilateral lacrimal glands: Secondary | ICD-10-CM | POA: Diagnosis not present

## 2016-11-15 DIAGNOSIS — Z973 Presence of spectacles and contact lenses: Secondary | ICD-10-CM | POA: Diagnosis not present

## 2016-11-15 DIAGNOSIS — H1013 Acute atopic conjunctivitis, bilateral: Secondary | ICD-10-CM | POA: Diagnosis not present

## 2016-12-10 DIAGNOSIS — H40033 Anatomical narrow angle, bilateral: Secondary | ICD-10-CM | POA: Diagnosis not present

## 2016-12-10 DIAGNOSIS — H1013 Acute atopic conjunctivitis, bilateral: Secondary | ICD-10-CM | POA: Diagnosis not present

## 2016-12-10 DIAGNOSIS — H35033 Hypertensive retinopathy, bilateral: Secondary | ICD-10-CM | POA: Diagnosis not present

## 2016-12-10 DIAGNOSIS — I708 Atherosclerosis of other arteries: Secondary | ICD-10-CM | POA: Diagnosis not present

## 2016-12-11 DIAGNOSIS — Z1231 Encounter for screening mammogram for malignant neoplasm of breast: Secondary | ICD-10-CM | POA: Diagnosis not present

## 2017-01-08 ENCOUNTER — Encounter: Payer: Self-pay | Admitting: Internal Medicine

## 2017-01-14 ENCOUNTER — Other Ambulatory Visit: Payer: Medicare Other | Admitting: Internal Medicine

## 2017-01-14 DIAGNOSIS — Z0000 Encounter for general adult medical examination without abnormal findings: Secondary | ICD-10-CM

## 2017-01-14 DIAGNOSIS — Z1321 Encounter for screening for nutritional disorder: Secondary | ICD-10-CM

## 2017-01-14 DIAGNOSIS — Z8639 Personal history of other endocrine, nutritional and metabolic disease: Secondary | ICD-10-CM | POA: Diagnosis not present

## 2017-01-14 DIAGNOSIS — E118 Type 2 diabetes mellitus with unspecified complications: Secondary | ICD-10-CM | POA: Diagnosis not present

## 2017-01-14 DIAGNOSIS — M8580 Other specified disorders of bone density and structure, unspecified site: Secondary | ICD-10-CM | POA: Diagnosis not present

## 2017-01-14 DIAGNOSIS — I10 Essential (primary) hypertension: Secondary | ICD-10-CM

## 2017-01-14 LAB — LIPID PANEL
Cholesterol: 220 mg/dL — ABNORMAL HIGH (ref <200)
HDL: 71 mg/dL (ref >50)
LDL Cholesterol: 138 mg/dL — ABNORMAL HIGH (ref <100)
Total CHOL/HDL Ratio: 3.1 Ratio (ref <5.0)
Triglycerides: 57 mg/dL (ref <150)
VLDL: 11 mg/dL (ref <30)

## 2017-01-14 LAB — COMPREHENSIVE METABOLIC PANEL
ALK PHOS: 75 U/L (ref 33–130)
ALT: 9 U/L (ref 6–29)
AST: 16 U/L (ref 10–35)
Albumin: 3.9 g/dL (ref 3.6–5.1)
BILIRUBIN TOTAL: 0.9 mg/dL (ref 0.2–1.2)
BUN: 21 mg/dL (ref 7–25)
CALCIUM: 9.5 mg/dL (ref 8.6–10.4)
CO2: 27 mmol/L (ref 20–31)
CREATININE: 0.74 mg/dL (ref 0.60–0.93)
Chloride: 103 mmol/L (ref 98–110)
GLUCOSE: 99 mg/dL (ref 65–99)
Potassium: 3.7 mmol/L (ref 3.5–5.3)
SODIUM: 139 mmol/L (ref 135–146)
Total Protein: 7.3 g/dL (ref 6.1–8.1)

## 2017-01-14 LAB — CBC WITH DIFFERENTIAL/PLATELET
BASOS PCT: 1 %
Basophils Absolute: 49 cells/uL (ref 0–200)
EOS ABS: 49 {cells}/uL (ref 15–500)
Eosinophils Relative: 1 %
HEMATOCRIT: 39.8 % (ref 35.0–45.0)
Hemoglobin: 12.8 g/dL (ref 11.7–15.5)
LYMPHS PCT: 25 %
Lymphs Abs: 1225 cells/uL (ref 850–3900)
MCH: 25.3 pg — ABNORMAL LOW (ref 27.0–33.0)
MCHC: 32.2 g/dL (ref 32.0–36.0)
MCV: 78.7 fL — AB (ref 80.0–100.0)
MONOS PCT: 6 %
MPV: 9.1 fL (ref 7.5–12.5)
Monocytes Absolute: 294 cells/uL (ref 200–950)
NEUTROS PCT: 67 %
Neutro Abs: 3283 cells/uL (ref 1500–7800)
PLATELETS: 269 10*3/uL (ref 140–400)
RBC: 5.06 MIL/uL (ref 3.80–5.10)
RDW: 15.9 % — AB (ref 11.0–15.0)
WBC: 4.9 10*3/uL (ref 3.8–10.8)

## 2017-01-14 LAB — TSH: TSH: 1.05 mIU/L

## 2017-01-14 LAB — VITAMIN B12: Vitamin B-12: 791 pg/mL (ref 200–1100)

## 2017-01-14 NOTE — Progress Notes (Signed)
Labs only

## 2017-01-15 LAB — VITAMIN D 25 HYDROXY (VIT D DEFICIENCY, FRACTURES): VIT D 25 HYDROXY: 34 ng/mL (ref 30–100)

## 2017-01-15 LAB — HEMOGLOBIN A1C
HEMOGLOBIN A1C: 5.3 % (ref <5.7)
MEAN PLASMA GLUCOSE: 105 mg/dL

## 2017-01-16 ENCOUNTER — Encounter: Payer: Medicare Other | Admitting: Internal Medicine

## 2017-01-21 ENCOUNTER — Ambulatory Visit (INDEPENDENT_AMBULATORY_CARE_PROVIDER_SITE_OTHER): Payer: Medicare Other | Admitting: Podiatry

## 2017-01-21 DIAGNOSIS — L84 Corns and callosities: Secondary | ICD-10-CM | POA: Diagnosis not present

## 2017-01-21 DIAGNOSIS — M79676 Pain in unspecified toe(s): Secondary | ICD-10-CM

## 2017-01-21 DIAGNOSIS — E119 Type 2 diabetes mellitus without complications: Secondary | ICD-10-CM

## 2017-01-21 DIAGNOSIS — B351 Tinea unguium: Secondary | ICD-10-CM

## 2017-01-21 NOTE — Progress Notes (Signed)
[name], female   DOB: September 11, 1941, 76 y.o.   MRN: [id number]    Subjective: This patient presents day for schedule visit complaining of uncomfortable toenails walking wearing shoes and a painful corn on the fifth right toe. Patient has a more recent diagnosis of diabetes  Objective: Orientated 3 DP and PT pulses 2/4 bilaterally Capillary reflex immediate bilaterally Sensation to 10 g monofilament wire intact 5/5 bilaterally Vibratory sensation reactive bilaterally Ankle reflex equal reactive bilaterally No open skin lesions bilaterally Well-organized corn lateral fifth right toe The toenails are elongated, brittle, deformed, discolored and tender direct palpation 6-10 Pes planus bilaterally HAV bilaterally Manual motor testing: Dorsi flexion, plantar flexion, inversion, eversion 5/5 bilaterally  Assessment: Diabetic without complications Symptomatic mycotic toenails 6-10 Keratoses 1  Plan: Debridement toenails 6-10 mechanically and electronically with slight bleeding fifth right toe, treated with topical antibiotic ointment and Band-Aid. Patient instructed removed Band-Aid 1-3 days and continue to apply topical antibiotic ointment daily until a scab forms Debrided keratoses 1 without any bleeding  Reappoint 3 months

## 2017-01-21 NOTE — Patient Instructions (Signed)
Remove the Band-Aid on the fifth right toe 1-3 days and apply topical antibiotic ointment and a Band-Aid daily until a scab forms  Diabetes and Foot Care Diabetes may cause you to have problems because of poor blood supply (circulation) to your feet and legs. This may cause the skin on your feet to become thinner, break easier, and heal more slowly. Your skin may become dry, and the skin may peel and crack. You may also have nerve damage in your legs and feet causing decreased feeling in them. You may not notice minor injuries to your feet that could lead to infections or more serious problems. Taking care of your feet is one of the most important things you can do for yourself. Follow these instructions at home:  Wear shoes at all times, even in the house. Do not go barefoot. Bare feet are easily injured.  Check your feet daily for blisters, cuts, and redness. If you cannot see the bottom of your feet, use a mirror or ask someone for help.  Wash your feet with warm water (do not use hot water) and mild soap. Then pat your feet and the areas between your toes until they are completely dry. Do not soak your feet as this can dry your skin.  Apply a moisturizing lotion or petroleum jelly (that does not contain alcohol and is unscented) to the skin on your feet and to dry, brittle toenails. Do not apply lotion between your toes.  Trim your toenails straight across. Do not dig under them or around the cuticle. File the edges of your nails with an emery board or nail file.  Do not cut corns or calluses or try to remove them with medicine.  Wear clean socks or stockings every day. Make sure they are not too tight. Do not wear knee-high stockings since they may decrease blood flow to your legs.  Wear shoes that fit properly and have enough cushioning. To break in new shoes, wear them for just a few hours a day. This prevents you from injuring your feet. Always look in your shoes before you put them on to  be sure there are no objects inside.  Do not cross your legs. This may decrease the blood flow to your feet.  If you find a minor scrape, cut, or break in the skin on your feet, keep it and the skin around it clean and dry. These areas may be cleansed with mild soap and water. Do not cleanse the area with peroxide, alcohol, or iodine.  When you remove an adhesive bandage, be sure not to damage the skin around it.  If you have a wound, look at it several times a day to make sure it is healing.  Do not use heating pads or hot water bottles. They may burn your skin. If you have lost feeling in your feet or legs, you may not know it is happening until it is too late.  Make sure your health care provider performs a complete foot exam at least annually or more often if you have foot problems. Report any cuts, sores, or bruises to your health care provider immediately. Contact a health care provider if:  You have an injury that is not healing.  You have cuts or breaks in the skin.  You have an ingrown nail.  You notice redness on your legs or feet.  You feel burning or tingling in your legs or feet.  You have pain or cramps in your legs  and feet.  Your legs or feet are numb.  Your feet always feel cold. Get help right away if:  There is increasing redness, swelling, or pain in or around a wound.  There is a red line that goes up your leg.  Pus is coming from a wound.  You develop a fever or as directed by your health care provider.  You notice a bad smell coming from an ulcer or wound. This information is not intended to replace advice given to you by your health care provider. Make sure you discuss any questions you have with your health care provider. Document Released: 12/13/2000 Document Revised: 05/23/2016 Document Reviewed: 05/25/2013 Elsevier Interactive Patient Education  2017 [employer].

## 2017-01-23 ENCOUNTER — Other Ambulatory Visit: Payer: Self-pay | Admitting: Internal Medicine

## 2017-01-27 ENCOUNTER — Telehealth: Payer: Self-pay | Admitting: Internal Medicine

## 2017-01-27 NOTE — Telephone Encounter (Signed)
This is NOT indicated unless symptomatic with flu in this patient.

## 2017-01-27 NOTE — Telephone Encounter (Signed)
Patient would like a prescription for Tama-flu because she visits her husband in a nursing facility and don't want to catch the flu.

## 2017-01-27 NOTE — Telephone Encounter (Signed)
Pt was notified of instructions, pt verbalized understanding.   

## 2017-02-11 ENCOUNTER — Ambulatory Visit (INDEPENDENT_AMBULATORY_CARE_PROVIDER_SITE_OTHER): Payer: Medicare Other | Admitting: Internal Medicine

## 2017-02-11 VITALS — BP 150/70 | HR 110 | Temp 98.5°F | Ht 61.25 in | Wt 179.0 lb

## 2017-02-11 DIAGNOSIS — Z0000 Encounter for general adult medical examination without abnormal findings: Secondary | ICD-10-CM | POA: Diagnosis not present

## 2017-02-11 DIAGNOSIS — E8881 Metabolic syndrome: Secondary | ICD-10-CM

## 2017-02-11 DIAGNOSIS — R8290 Unspecified abnormal findings in urine: Secondary | ICD-10-CM | POA: Diagnosis not present

## 2017-02-11 DIAGNOSIS — I10 Essential (primary) hypertension: Secondary | ICD-10-CM | POA: Diagnosis not present

## 2017-02-11 DIAGNOSIS — F411 Generalized anxiety disorder: Secondary | ICD-10-CM

## 2017-02-11 DIAGNOSIS — N390 Urinary tract infection, site not specified: Secondary | ICD-10-CM | POA: Diagnosis not present

## 2017-02-11 DIAGNOSIS — R7302 Impaired glucose tolerance (oral): Secondary | ICD-10-CM | POA: Diagnosis not present

## 2017-02-11 DIAGNOSIS — E784 Other hyperlipidemia: Secondary | ICD-10-CM

## 2017-02-11 DIAGNOSIS — Z8541 Personal history of malignant neoplasm of cervix uteri: Secondary | ICD-10-CM

## 2017-02-11 DIAGNOSIS — M1712 Unilateral primary osteoarthritis, left knee: Secondary | ICD-10-CM | POA: Diagnosis not present

## 2017-02-11 DIAGNOSIS — E7849 Other hyperlipidemia: Secondary | ICD-10-CM

## 2017-02-11 DIAGNOSIS — E049 Nontoxic goiter, unspecified: Secondary | ICD-10-CM

## 2017-02-11 LAB — POCT URINALYSIS DIPSTICK
BILIRUBIN UA: NEGATIVE
GLUCOSE UA: NEGATIVE
KETONES UA: NEGATIVE
Leukocytes, UA: NEGATIVE
Nitrite, UA: POSITIVE
Protein, UA: NEGATIVE
SPEC GRAV UA: 1.015
Urobilinogen, UA: NEGATIVE
pH, UA: 6

## 2017-02-11 MED ORDER — TRIAMTERENE-HCTZ 75-50 MG PO TABS: 1.0000 | ORAL_TABLET | Freq: Every day | ORAL | 3 refills | Status: DC

## 2017-02-11 MED ORDER — AMLODIPINE BESYLATE 5 MG PO TABS: 5.0000 mg | ORAL_TABLET | Freq: Every day | ORAL | 3 refills | Status: DC

## 2017-02-11 NOTE — Progress Notes (Addendum)
Subjective:    [name], female    DOB: 05-21-1941, 76 y.o.   MRN: [id number]  HPI 76 year old Black Female for [employer] and annual physical exam with evaluation of medical issues. She has a history of essential hypertension, hyperlipidemia, multinodular goiter, impaired glucose tolerance, anxiety, history of glaucoma , allergic rhinitis and osteoarthritis of knees. She has allergic rhinitis, benign colon polyp history, goiter and thyroid nodule followed by Dr. Dwyane Dee. She had ultrasound-guided needle biopsy aspirate of the thyroid gland in 2014. Biopsy was benign.  Colon polyp (large) was removed at [facility]. It was found to be benign.  She is on thyroid suppression by Dr. Dwyane Dee for goiter.  History of hemorrhoids and rectal itching for which she uses clotrimazole.  Has been on Maxzide 75/50 since 1999 for hypertension. In 2010 amlodipine was added.  History of anxiety and depression in 1998.  Hysterectomy without oophorectomy for cervical cancer in 1977 in [place].  Dr. Ronita Hipps  now does GYN exam.  Right hammertoe surgery 2002  History of vitamin D deficiency.  She is intolerant of penicillin-causes chest pain. She is also intolerant of codeine.  Social history: She is married. Husband is in a nursing home on [place]. with dementia. This is been stressful. 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 No children. Husband is retired Firefighter.  Family history: Mother deceased with history of hypertension and dementia. She had kidney failure. One sister lives in [place] but may be moving here in the near future. Father deceased with history of diabetes and hypertension.    Review of Systems  Constitutional: Positive for fatigue.  Respiratory: Negative.   Cardiovascular: Negative.   Gastrointestinal: Negative.   Genitourinary: Negative.   Musculoskeletal:       Left knee pain    Neurological: Negative.        Objective:   Physical Exam  Constitutional: She is oriented to person, place, and time. She appears well-developed and well-nourished. No distress.  HENT:  Head: Normocephalic and atraumatic.  Right Ear: External ear normal.  Left Ear: External ear normal.  Eyes: Conjunctivae and EOM are normal. Pupils are equal, round, and reactive to light.  Neck: Neck supple. No JVD present. No thyromegaly present.  Cardiovascular: Normal rate, regular rhythm, normal heart sounds and intact distal pulses.   No murmur heard. Pulmonary/Chest: Breath sounds normal. No respiratory distress. She has no wheezes. She has no rales.  Breasts normal female  Abdominal: Soft. Bowel sounds are normal. She exhibits no distension and no mass. There is no tenderness. There is no rebound and no guarding.  Genitourinary:  Genitourinary Comments: Deferred to GYN  Musculoskeletal: She exhibits no edema.  Lymphadenopathy:    She has no cervical adenopathy.  Neurological: She is alert and oriented to person, place, and time. She has normal reflexes. No cranial nerve deficit. Coordination normal.  Skin: Skin is warm and dry. No rash noted.  Psychiatric: She has a normal mood and affect. Her behavior is normal. Thought content normal.  Vitals reviewed.         Assessment & Plan:  Essential hypertension-Her blood pressure is elevated today during this exam. She needs to watch it at home and call with some readings in the next couple of weeks  Osteoarthritis left knee  History of anxiety  Situational stress  Allergic rhinitis  Obesity-continue diet and exercise efforts progress   History of  impaired glucose tolerance-hemoglobin A1c 5.3% in one year ago was 5.7%  History of cervical cancer  History of benign colon polyp  History of low MCV-likely thalassemia trait  Hyperlipidemia-LDL cholesterol is 138 and total cholesterol 220 with an HDL cholesterol of 71.  Acute  urinary infection. Treat with Cipro in follow-up in 10 days. She had abnormal urine dipstick and culture was sent.  Plan: Encouraged diet exercise and weight loss. It's difficult for her to exercise due to knee pain. Return in 6 months or as needed.  Subjective:   Patient presents for Medicare Annual/Subsequent preventive examination.  Review Past Medical/Family/Social:See above   Risk Factors  Current exercise habits: Tries to exercise some but osteoarthritis of knees prevent much exercise Dietary issues discussed: Low fat low carbohydrate  Cardiac risk factors:Hyperlipidemia, impaired glucose tolerance, hypertension  Depression Screen  (Note: if answer to either of the following is "Yes", a more complete depression screening is indicated)   Over the past two weeks, have you felt down, depressed or hopeless? No  Over the past two weeks, have you felt little interest or pleasure in doing things? No Have you lost interest or pleasure in daily life? No Do you often feel hopeless? No Do you cry easily over simple problems? No   Activities of Daily Living  In your present state of health, do you have any difficulty performing the following activities?:   Driving? No  Managing money? No  Feeding yourself? No  Getting from bed to chair? No  Climbing a flight of stairs? No  Preparing food and eating?: No  Bathing or showering? No  Getting dressed: No  Getting to the toilet? No  Using the toilet:No  Moving around from place to place: No  In the past year have you fallen or had a near fall?:No  Are you sexually active? No  Do you have more than one partner? No   Hearing Difficulties: No  Do you often ask people to speak up or repeat themselves? Occasionally Do you experience ringing or noises in your ears? No  Do you have difficulty understanding soft or whispered voices? No  Do you feel that you have a problem with memory? No Do you often misplace items? No    Home Safety:    Do you have a smoke alarm at your residence? Yes Do you have grab bars in the bathroom?Yes in one bathroom Do you have throw rugs in your house? No just front door mat   Cognitive Testing  Alert? Yes Normal Appearance?Yes  Oriented to person? Yes Place? Yes  Time? Yes  Recall of three objects? Yes  Can perform simple calculations? Yes  Displays appropriate judgment?Yes  Can read the correct time from a watch face?Yes   List the Names of Other Physician/Practitioners you currently use:  See referral list for the physicians patient is currently seeing.     Review of Systems: See above   Objective:     General appearance: Appears stated age and mildly obese  Head: Normocephalic, without obvious abnormality, atraumatic  Eyes: conj clear, EOMi PEERLA  Ears: normal TM's and external ear canals both ears  Nose: Nares normal. Septum midline. Mucosa normal. No drainage or sinus tenderness.  Throat: lips, mucosa, and tongue normal; teeth and gums normal  Neck: no adenopathy, no carotid bruit, no JVD, supple, symmetrical, trachea midline and thyroid not enlarged, symmetric, no tenderness/mass/nodules  No CVA tenderness.  Lungs: clear to auscultation bilaterally  Breasts: normal appearance, no masses  or tenderness Heart: regular rate and rhythm, S1, S2 normal, no murmur, click, rub or gallop  Abdomen: soft, non-tender; bowel sounds normal; no masses, no organomegaly  Musculoskeletal: ROM normal in all joints, no crepitus, no deformity, Normal muscle strengthen. Back  is symmetric, no curvature. Skin: Skin color, texture, turgor normal. No rashes or lesions  Lymph nodes: Cervical, supraclavicular, and axillary nodes normal.  Neurologic: CN 2 -12 Normal, Normal symmetric reflexes. Normal coordination and gait  Psych: Alert & Oriented x 3, Mood appear stable.    Assessment:    Annual wellness medicare exam   Plan:    During the course of the visit the patient was educated and  counseled about appropriate screening and preventive services including:   Annual flu vaccine  Annual mammogram     Patient Instructions (the written plan) was given to the patient.  Medicare Attestation  I have personally reviewed:  The patient's medical and social history  Their use of alcohol, tobacco or illicit drugs  Their current medications and supplements  The patient's functional ability including ADLs,fall risks, home safety risks, cognitive, and hearing and visual impairment  Diet and physical activities  Evidence for depression or mood disorders  The patient's weight, height, BMI, and visual acuity have been recorded in the chart. I have made referrals, counseling, and provided education to the patient based on review of the above and I have provided the patient with a written personalized care plan for preventive services.

## 2017-02-12 LAB — MICROALBUMIN / CREATININE URINE RATIO
Creatinine, Urine: 115 mg/dL (ref 20–320)
MICROALB/CREAT RATIO: 8 ug/mg{creat} (ref <30)
Microalb, Ur: 0.9 mg/dL

## 2017-02-13 ENCOUNTER — Other Ambulatory Visit: Payer: Self-pay

## 2017-02-13 LAB — URINE CULTURE

## 2017-02-13 MED ORDER — CIPROFLOXACIN HCL 250 MG PO TABS: 250.0000 mg | ORAL_TABLET | Freq: Two times a day (BID) | ORAL | 0 refills | Status: DC

## 2017-02-25 ENCOUNTER — Ambulatory Visit: Payer: Self-pay | Admitting: Internal Medicine

## 2017-02-26 ENCOUNTER — Encounter: Payer: Self-pay | Admitting: Internal Medicine

## 2017-02-26 NOTE — Patient Instructions (Signed)
Take Cipro as directed for urinary tract infection. Continue same medications. Watch diet and try to exercise. Return in 6 months.

## 2017-02-27 ENCOUNTER — Ambulatory Visit (INDEPENDENT_AMBULATORY_CARE_PROVIDER_SITE_OTHER): Payer: Medicare Other | Admitting: Internal Medicine

## 2017-02-27 ENCOUNTER — Encounter: Payer: Self-pay | Admitting: Internal Medicine

## 2017-02-27 VITALS — BP 138/88 | HR 88 | Temp 97.8°F

## 2017-02-27 DIAGNOSIS — N390 Urinary tract infection, site not specified: Secondary | ICD-10-CM

## 2017-02-27 DIAGNOSIS — R8290 Unspecified abnormal findings in urine: Secondary | ICD-10-CM | POA: Diagnosis not present

## 2017-02-27 LAB — POCT URINALYSIS DIPSTICK
BILIRUBIN UA: NEGATIVE
Blood, UA: NEGATIVE
GLUCOSE UA: NEGATIVE
Ketones, UA: NEGATIVE
Leukocytes, UA: NEGATIVE
Nitrite, UA: NEGATIVE
Protein, UA: NEGATIVE
SPEC GRAV UA: 1.015
Urobilinogen, UA: NEGATIVE
pH, UA: 6.5

## 2017-02-27 NOTE — Progress Notes (Signed)
   Subjective:    [name], female    DOB: May 08, 1941, 76 y.o.   MRN: [id number]  HPI Patient came to give urine specimen in follow up of UTI treatment. However she dropped a specimen in the toilet.  She was given a knee specimen bottle and will bring [place] a specimen back from home     Review of Systems     Objective:   Physical Exam  Patient returned with urine specimen. UA dipstick is within normal limits      Assessment & Plan:  UTI resolved

## 2017-02-27 NOTE — Patient Instructions (Signed)
Urinalysis has cleared status post treatment for UTI. Return as needed

## 2017-03-18 DIAGNOSIS — Z6832 Body mass index (BMI) 32.0-32.9, adult: Secondary | ICD-10-CM | POA: Diagnosis not present

## 2017-03-18 DIAGNOSIS — Z124 Encounter for screening for malignant neoplasm of cervix: Secondary | ICD-10-CM | POA: Diagnosis not present

## 2017-03-18 DIAGNOSIS — Z01419 Encounter for gynecological examination (general) (routine) without abnormal findings: Secondary | ICD-10-CM | POA: Diagnosis not present

## 2017-03-18 DIAGNOSIS — C539 Malignant neoplasm of cervix uteri, unspecified: Secondary | ICD-10-CM | POA: Diagnosis not present

## 2017-03-25 ENCOUNTER — Ambulatory Visit (INDEPENDENT_AMBULATORY_CARE_PROVIDER_SITE_OTHER): Payer: Medicare Other | Admitting: Podiatry

## 2017-03-25 ENCOUNTER — Encounter: Payer: Self-pay | Admitting: Podiatry

## 2017-03-25 DIAGNOSIS — B351 Tinea unguium: Secondary | ICD-10-CM | POA: Diagnosis not present

## 2017-03-25 DIAGNOSIS — M2140 Flat foot [pes planus] (acquired), unspecified foot: Secondary | ICD-10-CM

## 2017-03-25 DIAGNOSIS — M79676 Pain in unspecified toe(s): Secondary | ICD-10-CM | POA: Diagnosis not present

## 2017-03-25 NOTE — Progress Notes (Signed)
Complaint:  Visit Type: Patient returns to my office for continued preventative foot care services. Complaint: Patient states" my nails have grown long and thick and become painful to walk and wear shoes"  The patient presents for preventative foot care services. No changes to ROS  Podiatric Exam: Vascular: dorsalis pedis and posterior tibial pulses are palpable bilateral. Capillary return is immediate. Temperature gradient is WNL. Skin turgor WNL  Sensorium: Normal Semmes Weinstein monofilament test. Normal tactile sensation bilaterally. Nail Exam: Pt has thick disfigured discolored nails with subungual debris noted bilateral entire nail hallux through fifth toenails Ulcer Exam: There is no evidence of ulcer or pre-ulcerative changes or infection. Orthopedic Exam: Muscle tone and strength are WNL. No limitations in general ROM. No crepitus or effusions noted. Foot type and digits show no abnormalities. 1st MCJ right foot.  HAV 1st MPJ  B/L.  Pes planus Skin: No Porokeratosis. No infection or ulcers  Diagnosis:  Onychomycosis, , Pain in right toe, pain in left toes  Treatment & Plan Procedures and Treatment: Consent by patient was obtained for treatment procedures. The patient understood the discussion of treatment and procedures well. All questions were answered thoroughly reviewed. Debridement of mycotic and hypertrophic toenails, 1 through 5 bilateral and clearing of subungual debris. No ulceration, no infection noted.  Return Visit-Office Procedure: Patient instructed to return to the office for a follow up visit 3 months for continued evaluation and treatment.    Gardiner Barefoot DPM

## 2017-05-12 DIAGNOSIS — B351 Tinea unguium: Secondary | ICD-10-CM | POA: Diagnosis not present

## 2017-05-12 DIAGNOSIS — B353 Tinea pedis: Secondary | ICD-10-CM | POA: Diagnosis not present

## 2017-05-12 DIAGNOSIS — M79674 Pain in right toe(s): Secondary | ICD-10-CM | POA: Diagnosis not present

## 2017-05-12 DIAGNOSIS — L602 Onychogryphosis: Secondary | ICD-10-CM | POA: Diagnosis not present

## 2017-05-12 DIAGNOSIS — I739 Peripheral vascular disease, unspecified: Secondary | ICD-10-CM | POA: Diagnosis not present

## 2017-05-12 DIAGNOSIS — R262 Difficulty in walking, not elsewhere classified: Secondary | ICD-10-CM | POA: Diagnosis not present

## 2017-05-12 DIAGNOSIS — M79675 Pain in left toe(s): Secondary | ICD-10-CM | POA: Diagnosis not present

## 2017-06-10 DIAGNOSIS — H40013 Open angle with borderline findings, low risk, bilateral: Secondary | ICD-10-CM | POA: Diagnosis not present

## 2017-06-10 DIAGNOSIS — H40033 Anatomical narrow angle, bilateral: Secondary | ICD-10-CM | POA: Diagnosis not present

## 2017-06-12 DIAGNOSIS — M1712 Unilateral primary osteoarthritis, left knee: Secondary | ICD-10-CM | POA: Diagnosis not present

## 2017-06-25 ENCOUNTER — Ambulatory Visit: Payer: Medicare Other | Admitting: Podiatry

## 2017-07-01 DIAGNOSIS — R3915 Urgency of urination: Secondary | ICD-10-CM | POA: Diagnosis not present

## 2017-07-08 ENCOUNTER — Other Ambulatory Visit: Payer: Medicare Other

## 2017-07-11 ENCOUNTER — Ambulatory Visit: Payer: Medicare Other | Admitting: Endocrinology

## 2017-07-17 ENCOUNTER — Encounter: Payer: Self-pay | Admitting: Podiatry

## 2017-07-17 ENCOUNTER — Ambulatory Visit (INDEPENDENT_AMBULATORY_CARE_PROVIDER_SITE_OTHER): Payer: Medicare Other | Admitting: Podiatry

## 2017-07-17 DIAGNOSIS — B351 Tinea unguium: Secondary | ICD-10-CM

## 2017-07-17 DIAGNOSIS — M79676 Pain in unspecified toe(s): Secondary | ICD-10-CM | POA: Diagnosis not present

## 2017-07-17 DIAGNOSIS — M2140 Flat foot [pes planus] (acquired), unspecified foot: Secondary | ICD-10-CM

## 2017-07-17 NOTE — Progress Notes (Signed)
Complaint:  Visit Type: Patient returns to my office for continued preventative foot care services. Complaint: Patient states" my nails have grown long and thick and become painful to walk and wear shoes"  The patient presents for preventative foot care services. No changes to ROS  Podiatric Exam: Vascular: dorsalis pedis and posterior tibial pulses are palpable bilateral. Capillary return is immediate. Temperature gradient is WNL. Skin turgor WNL  Sensorium: Normal Semmes Weinstein monofilament test. Normal tactile sensation bilaterally. Nail Exam: Pt has thick disfigured discolored nails with subungual debris noted bilateral entire nail hallux through fifth toenails Ulcer Exam: There is no evidence of ulcer or pre-ulcerative changes or infection. Orthopedic Exam: Muscle tone and strength are WNL. No limitations in general ROM. No crepitus or effusions noted. Foot type and digits show no abnormalities. 1st MCJ right foot.  HAV 1st MPJ  B/L.  Pes planus Skin: No Porokeratosis. No infection or ulcers  Diagnosis:  Onychomycosis, , Pain in right toe, pain in left toes  Treatment & Plan Procedures and Treatment: Consent by patient was obtained for treatment procedures. The patient understood the discussion of treatment and procedures well. All questions were answered thoroughly reviewed. Debridement of mycotic and hypertrophic toenails, 1 through 5 bilateral and clearing of subungual debris. No ulceration, no infection noted.  Return Visit-Office Procedure: Patient instructed to return to the office for a follow up visit 3 months for continued evaluation and treatment.    Gardiner Barefoot DPM

## 2017-07-24 DIAGNOSIS — M1712 Unilateral primary osteoarthritis, left knee: Secondary | ICD-10-CM | POA: Diagnosis not present

## 2017-08-11 DIAGNOSIS — I739 Peripheral vascular disease, unspecified: Secondary | ICD-10-CM | POA: Diagnosis not present

## 2017-08-11 DIAGNOSIS — L602 Onychogryphosis: Secondary | ICD-10-CM | POA: Diagnosis not present

## 2017-08-11 DIAGNOSIS — B353 Tinea pedis: Secondary | ICD-10-CM | POA: Diagnosis not present

## 2017-08-11 DIAGNOSIS — R262 Difficulty in walking, not elsewhere classified: Secondary | ICD-10-CM | POA: Diagnosis not present

## 2017-08-11 DIAGNOSIS — M79675 Pain in left toe(s): Secondary | ICD-10-CM | POA: Diagnosis not present

## 2017-08-11 DIAGNOSIS — B351 Tinea unguium: Secondary | ICD-10-CM | POA: Diagnosis not present

## 2017-08-11 DIAGNOSIS — M79674 Pain in right toe(s): Secondary | ICD-10-CM | POA: Diagnosis not present

## 2017-08-19 ENCOUNTER — Other Ambulatory Visit: Payer: Medicare Other

## 2017-08-22 ENCOUNTER — Ambulatory Visit: Payer: Medicare Other | Admitting: Endocrinology

## 2017-09-09 ENCOUNTER — Other Ambulatory Visit: Payer: Medicare Other

## 2017-09-11 DIAGNOSIS — M1712 Unilateral primary osteoarthritis, left knee: Secondary | ICD-10-CM | POA: Diagnosis not present

## 2017-09-16 ENCOUNTER — Other Ambulatory Visit: Payer: Medicare Other

## 2017-09-17 ENCOUNTER — Ambulatory Visit: Payer: Medicare Other | Admitting: Endocrinology

## 2017-09-23 ENCOUNTER — Other Ambulatory Visit: Payer: Self-pay | Admitting: Endocrinology

## 2017-09-23 ENCOUNTER — Other Ambulatory Visit (INDEPENDENT_AMBULATORY_CARE_PROVIDER_SITE_OTHER): Payer: Medicare Other

## 2017-09-23 DIAGNOSIS — E042 Nontoxic multinodular goiter: Secondary | ICD-10-CM | POA: Diagnosis not present

## 2017-09-23 LAB — TSH: TSH: 0.84 u[IU]/mL (ref 0.35–4.50)

## 2017-09-23 LAB — T4, FREE: FREE T4: 1.08 ng/dL (ref 0.60–1.60)

## 2017-10-06 ENCOUNTER — Encounter: Payer: Self-pay | Admitting: Endocrinology

## 2017-10-06 ENCOUNTER — Ambulatory Visit (INDEPENDENT_AMBULATORY_CARE_PROVIDER_SITE_OTHER): Payer: Medicare Other | Admitting: Endocrinology

## 2017-10-06 VITALS — BP 138/80 | HR 115 | Ht 62.25 in | Wt 181.2 lb

## 2017-10-06 DIAGNOSIS — E042 Nontoxic multinodular goiter: Secondary | ICD-10-CM

## 2017-10-06 NOTE — Progress Notes (Signed)
[name], female   DOB: March 02, 1941, 75 y.o.   MRN: [id number]    Reason for Appointment: Goiter, followup    History of Present Illness:   The patient's thyroid enlargement was first discovered in early 2012 when she was having a CT scan of her neck Subsequent ultrasound showed a 4.6 cm left-sided thyroid nodule. Aspiration biopsy of this showed non-neoplastic goiter  Because of the increased size of the left lobe nodule another needle aspiration was done again in 7/14 which was also benign.  Previously because of the tendency for her thyroid nodule to be gradually increasing she was told to try levothyroxine 75 mcg empirically to help with preventing further enlargement However follow-up TSH in 9/15 was significantly low and levothyroxine was stopped  She is back here for her annual follow-up She has had no difficulty with swallowing    Does not feel like she has any choking sensation in her neck or pressure in any position  Thyroid levels are normal again  Her last ultrasound in 04/2013 showed the following: The dominant nodule in the lower pole of the left lobe measures 4.6 x 3.7 x 4.9 cm  Previously, it measured 4.6 x 3.0 x 3.6 cm.  Right lobe nodules are not significantly changed. Right upper pole nodule is 13 x 9 x 9 mm. Right lower pole nodule is 9 x 6 x 8 mm   Lab Results  Component Value Date   FREET4 1.08 09/23/2017   FREET4 0.97 07/07/2015   FREET4 1.04 11/07/2014   TSH 0.84 09/23/2017   TSH 1.05 01/14/2017   TSH 0.79 07/08/2016      Allergies as of 10/06/2017      Reactions   Penicillins Other (See Comments)   Chest pain   Codeine    Darvon       Medication List       Accurate as of 10/06/17  9:13 AM. Always use your most recent med list.          amLODipine 5 MG tablet Commonly known as:  NORVASC Take 1 tablet (5 mg total) by mouth daily.   CALTRATE 600+D PLUS 600-400 MG-UNIT per tablet Chew 1 tablet by mouth  daily.   clotrimazole-betamethasone cream Commonly known as:  LOTRISONE Apply 1 application topically 2 (two) times daily.   econazole nitrate 1 % cream Reported on 07/11/2016   Fish Oil 1000 MG Cpdr Take by mouth.   Garlic 782 MG Tabs Take 1,000 mg by mouth.   loratadine 10 MG tablet Commonly known as:  CLARITIN Take 1 tablet (10 mg total) by mouth daily.   multivitamin tablet Take 1 tablet by mouth daily.   triamterene-hydrochlorothiazide 75-50 MG tablet Commonly known as:  MAXZIDE Take 1 tablet by mouth daily.   VITAMIN D (CHOLECALCIFEROL) PO Take by mouth.       Allergies:  Allergies  Allergen Reactions  . Penicillins Other (See Comments)    Chest pain  . Codeine   . Darvon     Past Medical History:  Diagnosis Date  . Allergy   . Cancer (HCC)    cervical  . Hyperlipidemia   . Hypertension   . Obesity   . Osteopenia   . Vitamin D deficiency     Past Surgical History:  Procedure Laterality Date  . ABDOMINAL HYSTERECTOMY    . EYE SURGERY    . HAMMER TOE SURGERY  12/02   right    Family History  Problem Relation Age of Onset  . Hypertension Mother   . Heart disease Father   . Hypertension Father   . Thyroid disease Neg Hx     Social History:  reports that she has never smoked. She has never used smokeless tobacco. She reports that she does not drink alcohol or use drugs.   Review of Systems:  There is a  history of high blood pressure Followed by PCP .                Examination:   BP 138/80   Pulse (!) 115   Ht 5' 2.25" (1.581 m)   Wt 181 lb 3.2 oz (82.2 kg)   SpO2 98%   BMI 32.88 kg/m            She looks well       Thyroid exam: She has a nodule on  the left lobe and isthmus which is mostly palpable on swallowing.  This is smooth and slightly firm and is about 3-4 cm in size, difficult to estimate Right lobe is just palpable on swallowing also and mostly medially  There is no lymphadenopathy in the neck .     Deep  tendon reflexes  at biceps are normal.   Assessment/Plan:  Multinodular goiter with dominant left lobe nodule which has been proven benign by biopsy twice This measures about 5 cm on ultrasound done in 2014 She has no local pressure symptoms or difficulty swallowing  Clinically her thyroid nodule does appear about the same Thyroid levels are normal again Unable to use levothyroxine for suppression since she had a suppressed TSH only on 75 g  She will followup annually for evaluation   [facility] 10/06/2017

## 2017-10-14 DIAGNOSIS — M2011 Hallux valgus (acquired), right foot: Secondary | ICD-10-CM | POA: Diagnosis not present

## 2017-10-14 DIAGNOSIS — B351 Tinea unguium: Secondary | ICD-10-CM | POA: Diagnosis not present

## 2017-10-14 DIAGNOSIS — M2041 Other hammer toe(s) (acquired), right foot: Secondary | ICD-10-CM | POA: Diagnosis not present

## 2017-10-14 DIAGNOSIS — L602 Onychogryphosis: Secondary | ICD-10-CM | POA: Diagnosis not present

## 2017-10-14 DIAGNOSIS — M79674 Pain in right toe(s): Secondary | ICD-10-CM | POA: Diagnosis not present

## 2017-10-14 DIAGNOSIS — R262 Difficulty in walking, not elsewhere classified: Secondary | ICD-10-CM | POA: Diagnosis not present

## 2017-10-14 DIAGNOSIS — B353 Tinea pedis: Secondary | ICD-10-CM | POA: Diagnosis not present

## 2017-10-14 DIAGNOSIS — M2042 Other hammer toe(s) (acquired), left foot: Secondary | ICD-10-CM | POA: Diagnosis not present

## 2017-10-14 DIAGNOSIS — M79675 Pain in left toe(s): Secondary | ICD-10-CM | POA: Diagnosis not present

## 2017-10-14 DIAGNOSIS — I739 Peripheral vascular disease, unspecified: Secondary | ICD-10-CM | POA: Diagnosis not present

## 2017-10-14 DIAGNOSIS — M2012 Hallux valgus (acquired), left foot: Secondary | ICD-10-CM | POA: Diagnosis not present

## 2017-11-10 DIAGNOSIS — M1712 Unilateral primary osteoarthritis, left knee: Secondary | ICD-10-CM | POA: Diagnosis not present

## 2017-11-10 DIAGNOSIS — S83422A Sprain of lateral collateral ligament of left knee, initial encounter: Secondary | ICD-10-CM | POA: Diagnosis not present

## 2017-11-28 ENCOUNTER — Ambulatory Visit (INDEPENDENT_AMBULATORY_CARE_PROVIDER_SITE_OTHER): Payer: Medicare Other | Admitting: Internal Medicine

## 2017-11-28 ENCOUNTER — Encounter: Payer: Self-pay | Admitting: Internal Medicine

## 2017-11-28 VITALS — BP 150/60 | HR 126 | Temp 99.0°F | Wt 180.0 lb

## 2017-11-28 DIAGNOSIS — R300 Dysuria: Secondary | ICD-10-CM | POA: Diagnosis not present

## 2017-11-28 DIAGNOSIS — J329 Chronic sinusitis, unspecified: Secondary | ICD-10-CM

## 2017-11-28 LAB — POCT URINALYSIS DIPSTICK
Bilirubin, UA: NEGATIVE
Glucose, UA: NEGATIVE
Ketones, UA: NEGATIVE
LEUKOCYTES UA: NEGATIVE
NITRITE UA: NEGATIVE
PH UA: 7 (ref 5.0–8.0)
PROTEIN UA: NEGATIVE
RBC UA: NEGATIVE
Spec Grav, UA: 1.015 (ref 1.010–1.025)
Urobilinogen, UA: 0.2 E.U./dL

## 2017-11-28 MED ORDER — AZITHROMYCIN 250 MG PO TABS: ORAL_TABLET | ORAL | 0 refills | Status: DC

## 2017-11-28 MED ORDER — METHYLPREDNISOLONE ACETATE 80 MG/ML IJ SUSP
80.0000 mg | Freq: Once | INTRAMUSCULAR | Status: AC
  Administered 2017-11-28: 80 mg via INTRAMUSCULAR

## 2017-11-28 NOTE — Progress Notes (Signed)
   Subjective:    [name], female    DOB: Oct 10, 1941, 76 y.o.   MRN: [id number]  HPI Several days patient has had URI type symptoms ingestion the maxillary sinus area.  No documented fever or chills.  No discolored sputum production sounds nasally congested when she speaks.  Has been traveling to [place].  She and her sister have been working on settling parents' estate and selling parents home there.  Sister is now going to be living in [place].  Husband with dementia still and [facility] assisted living  Has been having issues with osteoarthritis knee pain and took a short course of prednisone which made her jittery  Review of Systems see above-complaining of right ear pain.  Wants urine checked.     Objective:   Physical Exam Sounds nasally congested.  Pharynx is clear.  TMs are clear.  Neck is supple without adenopathy.  Chest clear to auscultation.       Assessment & Plan:  Acute maxillary sinusitis  Urine dipstick is normal  Plan: Depo-Medrol 80 mg IM.  Zithromax Z-Pak take 2 tablets day 1 followed by 1 tablet days 2 through 5.

## 2017-11-28 NOTE — Patient Instructions (Addendum)
  Urine dipstick is normal.  Depo-Medrol 80 mg IM.  Take Zithromax as directed.

## 2017-12-03 ENCOUNTER — Ambulatory Visit (INDEPENDENT_AMBULATORY_CARE_PROVIDER_SITE_OTHER): Payer: Medicare Other | Admitting: Podiatry

## 2017-12-03 ENCOUNTER — Encounter: Payer: Self-pay | Admitting: Podiatry

## 2017-12-03 DIAGNOSIS — E119 Type 2 diabetes mellitus without complications: Secondary | ICD-10-CM

## 2017-12-03 DIAGNOSIS — B351 Tinea unguium: Secondary | ICD-10-CM | POA: Diagnosis not present

## 2017-12-03 DIAGNOSIS — M79676 Pain in unspecified toe(s): Secondary | ICD-10-CM

## 2017-12-03 NOTE — Progress Notes (Addendum)
Complaint:  Visit Type: Patient returns to my office for continued preventative foot care services. Complaint: Patient states" my nails have grown long and thick and become painful to walk and wear shoes"  The patient presents for preventative foot care services. No changes to ROS  Podiatric Exam: Vascular: dorsalis pedis and posterior tibial pulses are palpable bilateral. Capillary return is immediate. Temperature gradient is WNL. Skin turgor WNL  Sensorium: Normal Semmes Weinstein monofilament test. Normal tactile sensation bilaterally. Nail Exam: Pt has thick disfigured discolored nails with subungual debris noted bilateral entire nail hallux through fifth toenails Ulcer Exam: There is no evidence of ulcer or pre-ulcerative changes or infection. Orthopedic Exam: Muscle tone and strength are WNL. No limitations in general ROM. No crepitus or effusions noted. Foot type and digits show no abnormalities. 1st MCJ right foot.  HAV 1st MPJ  B/L.  Pes planus.  Fifth toe deformity right Skin: No Porokeratosis. No infection or ulcers  Diagnosis:  Onychomycosis, , Pain in right toe, pain in left toes  Treatment & Plan Procedures and Treatment: Consent by patient was obtained for treatment procedures. The patient understood the discussion of treatment and procedures well. All questions were answered thoroughly reviewed. Debridement of mycotic and hypertrophic toenails, 1 through 5 bilateral and clearing of subungual debris. No ulceration, no infection noted.  ABN signed for 2018. Return Visit-Office Procedure: Patient instructed to return to the office for a follow up visit 3 months for continued evaluation and treatment.    Gardiner Barefoot DPM

## 2017-12-04 ENCOUNTER — Other Ambulatory Visit: Payer: Self-pay

## 2017-12-16 DIAGNOSIS — H2513 Age-related nuclear cataract, bilateral: Secondary | ICD-10-CM | POA: Diagnosis not present

## 2017-12-16 DIAGNOSIS — H40013 Open angle with borderline findings, low risk, bilateral: Secondary | ICD-10-CM | POA: Diagnosis not present

## 2017-12-16 DIAGNOSIS — H40033 Anatomical narrow angle, bilateral: Secondary | ICD-10-CM | POA: Diagnosis not present

## 2017-12-16 DIAGNOSIS — H25013 Cortical age-related cataract, bilateral: Secondary | ICD-10-CM | POA: Diagnosis not present

## 2017-12-25 ENCOUNTER — Ambulatory Visit (INDEPENDENT_AMBULATORY_CARE_PROVIDER_SITE_OTHER): Payer: Medicare Other | Admitting: Internal Medicine

## 2017-12-25 ENCOUNTER — Ambulatory Visit: Payer: Medicare Other

## 2017-12-25 DIAGNOSIS — Z23 Encounter for immunization: Secondary | ICD-10-CM | POA: Diagnosis not present

## 2017-12-25 NOTE — Progress Notes (Signed)
Flu vaccine given.

## 2017-12-25 NOTE — Patient Instructions (Signed)
Flu vaccine given.

## 2018-02-16 ENCOUNTER — Other Ambulatory Visit: Payer: Self-pay | Admitting: Internal Medicine

## 2018-02-20 ENCOUNTER — Encounter: Payer: Self-pay | Admitting: Podiatry

## 2018-02-20 ENCOUNTER — Ambulatory Visit (INDEPENDENT_AMBULATORY_CARE_PROVIDER_SITE_OTHER): Payer: Medicare Other | Admitting: Podiatry

## 2018-02-20 DIAGNOSIS — B351 Tinea unguium: Secondary | ICD-10-CM | POA: Diagnosis not present

## 2018-02-20 DIAGNOSIS — M79676 Pain in unspecified toe(s): Secondary | ICD-10-CM

## 2018-02-22 DIAGNOSIS — R079 Chest pain, unspecified: Secondary | ICD-10-CM | POA: Diagnosis not present

## 2018-02-22 NOTE — Progress Notes (Signed)
  Subjective:  Patient ID: AFUA HOOTS, female    DOB: 12/08/1941,  MRN: [id number]  Chief Complaint  Patient presents with  . Nail Problem    trim   77 y.o. female returns for the above complaint.  Reports painfully thickened elongated nails that make it hard for her to walk.  Objective:  There were no vitals filed for this visit. General AA&O x3. Normal mood and affect.  Vascular Pedal pulses palpable.  Neurologic Epicritic sensation grossly intact.  Dermatologic No open lesions. Skin normal texture and turgor. Nails elongated thickened with pain to palpation  Orthopedic: No pain to palpation either foot.   Assessment & Plan:  Patient was evaluated and treated and all questions answered.  Onychomycosis with pain -Nails palliatively debrided x10  Procedure: Nail Debridement Rationale: pain Type of Debridement: manual, sharp debridement. Instrumentation: Nail nipper, rotary burr. Number of Nails: 10

## 2018-02-24 ENCOUNTER — Encounter: Payer: Self-pay | Admitting: Internal Medicine

## 2018-02-24 DIAGNOSIS — Z1231 Encounter for screening mammogram for malignant neoplasm of breast: Secondary | ICD-10-CM | POA: Diagnosis not present

## 2018-03-02 ENCOUNTER — Ambulatory Visit: Payer: Medicare Other | Admitting: Podiatry

## 2018-03-03 ENCOUNTER — Ambulatory Visit: Payer: Medicare Other | Admitting: Podiatry

## 2018-03-19 DIAGNOSIS — M1712 Unilateral primary osteoarthritis, left knee: Secondary | ICD-10-CM | POA: Diagnosis not present

## 2018-03-19 DIAGNOSIS — M1990 Unspecified osteoarthritis, unspecified site: Secondary | ICD-10-CM | POA: Insufficient documentation

## 2018-04-22 DIAGNOSIS — M25572 Pain in left ankle and joints of left foot: Secondary | ICD-10-CM | POA: Diagnosis not present

## 2018-04-22 DIAGNOSIS — M79672 Pain in left foot: Secondary | ICD-10-CM | POA: Diagnosis not present

## 2018-04-24 ENCOUNTER — Ambulatory Visit (INDEPENDENT_AMBULATORY_CARE_PROVIDER_SITE_OTHER): Payer: Medicare Other | Admitting: Podiatry

## 2018-04-24 DIAGNOSIS — B351 Tinea unguium: Secondary | ICD-10-CM

## 2018-04-24 DIAGNOSIS — M79609 Pain in unspecified limb: Secondary | ICD-10-CM

## 2018-04-26 NOTE — Progress Notes (Signed)
  Subjective:  [name], female    DOB: 06/11/1941,  MRN: [id number]  Chief Complaint  Patient presents with  . Nail Problem    3 month debride   77 y.o. female returns for the above complaint.  Reports painfully thickened elongated nails that make it hard for her to walk.  Objective:  There were no vitals filed for this visit. General AA&O x3. Normal mood and affect.  Vascular Pedal pulses palpable.  Neurologic Epicritic sensation grossly intact.  Dermatologic No open lesions. Skin normal texture and turgor. Nails elongated thickened with pain to palpation  Orthopedic: No pain to palpation either foot.   Assessment & Plan:  Patient was evaluated and treated and all questions answered.  Onychomycosis with pain -Nails palliatively debrided x10  Procedure: Nail Debridement Rationale: pain Type of Debridement: manual, sharp debridement. Instrumentation: Nail nipper, rotary burr. Number of Nails: 10

## 2018-04-27 DIAGNOSIS — Z124 Encounter for screening for malignant neoplasm of cervix: Secondary | ICD-10-CM | POA: Diagnosis not present

## 2018-04-27 DIAGNOSIS — Z01419 Encounter for gynecological examination (general) (routine) without abnormal findings: Secondary | ICD-10-CM | POA: Diagnosis not present

## 2018-05-05 DIAGNOSIS — M67969 Unspecified disorder of synovium and tendon, unspecified lower leg: Secondary | ICD-10-CM | POA: Diagnosis not present

## 2018-05-07 DIAGNOSIS — M67962 Unspecified disorder of synovium and tendon, left lower leg: Secondary | ICD-10-CM | POA: Diagnosis not present

## 2018-05-11 DIAGNOSIS — M67962 Unspecified disorder of synovium and tendon, left lower leg: Secondary | ICD-10-CM | POA: Diagnosis not present

## 2018-05-12 ENCOUNTER — Telehealth: Payer: Self-pay

## 2018-05-12 NOTE — Telephone Encounter (Signed)
Patient called states she would like to try hemp oil (cvd oil) for knee arthritis and she would like to know your thoughts on that? She said she would put one drop of oil under her tongue.  Cb# [phone]

## 2018-05-12 NOTE — Telephone Encounter (Signed)
There is no medical data on it. I have patients who have tried it and say it helps. I currently have no opinion on it since it has not been well studied in the medical literature.

## 2018-05-13 DIAGNOSIS — M67962 Unspecified disorder of synovium and tendon, left lower leg: Secondary | ICD-10-CM | POA: Diagnosis not present

## 2018-05-13 NOTE — Telephone Encounter (Signed)
LVM letting patient no what Dr Verlene Mayer response was to her questions and said for her to [facility] if she had anymore questions

## 2018-05-18 ENCOUNTER — Other Ambulatory Visit: Payer: Self-pay | Admitting: Internal Medicine

## 2018-05-18 DIAGNOSIS — M67969 Unspecified disorder of synovium and tendon, unspecified lower leg: Secondary | ICD-10-CM | POA: Diagnosis not present

## 2018-05-19 DIAGNOSIS — L821 Other seborrheic keratosis: Secondary | ICD-10-CM | POA: Diagnosis not present

## 2018-05-19 DIAGNOSIS — B078 Other viral warts: Secondary | ICD-10-CM | POA: Diagnosis not present

## 2018-05-19 DIAGNOSIS — B353 Tinea pedis: Secondary | ICD-10-CM | POA: Diagnosis not present

## 2018-05-21 DIAGNOSIS — M67969 Unspecified disorder of synovium and tendon, unspecified lower leg: Secondary | ICD-10-CM | POA: Diagnosis not present

## 2018-05-22 ENCOUNTER — Ambulatory Visit: Payer: Medicare Other | Admitting: Podiatry

## 2018-05-26 DIAGNOSIS — M67962 Unspecified disorder of synovium and tendon, left lower leg: Secondary | ICD-10-CM | POA: Diagnosis not present

## 2018-05-29 DIAGNOSIS — M67962 Unspecified disorder of synovium and tendon, left lower leg: Secondary | ICD-10-CM | POA: Diagnosis not present

## 2018-06-01 DIAGNOSIS — M67969 Unspecified disorder of synovium and tendon, unspecified lower leg: Secondary | ICD-10-CM | POA: Diagnosis not present

## 2018-06-08 ENCOUNTER — Other Ambulatory Visit: Payer: Self-pay | Admitting: Internal Medicine

## 2018-06-11 DIAGNOSIS — M25572 Pain in left ankle and joints of left foot: Secondary | ICD-10-CM | POA: Diagnosis not present

## 2018-07-01 DIAGNOSIS — M1712 Unilateral primary osteoarthritis, left knee: Secondary | ICD-10-CM | POA: Insufficient documentation

## 2018-07-01 DIAGNOSIS — M13862 Other specified arthritis, left knee: Secondary | ICD-10-CM | POA: Diagnosis not present

## 2018-07-07 DIAGNOSIS — H40013 Open angle with borderline findings, low risk, bilateral: Secondary | ICD-10-CM | POA: Diagnosis not present

## 2018-07-07 DIAGNOSIS — H40033 Anatomical narrow angle, bilateral: Secondary | ICD-10-CM | POA: Diagnosis not present

## 2018-07-07 DIAGNOSIS — H18892 Other specified disorders of cornea, left eye: Secondary | ICD-10-CM | POA: Diagnosis not present

## 2018-07-08 DIAGNOSIS — M13862 Other specified arthritis, left knee: Secondary | ICD-10-CM | POA: Diagnosis not present

## 2018-07-10 DIAGNOSIS — M13862 Other specified arthritis, left knee: Secondary | ICD-10-CM | POA: Diagnosis not present

## 2018-07-14 DIAGNOSIS — M13862 Other specified arthritis, left knee: Secondary | ICD-10-CM | POA: Diagnosis not present

## 2018-07-17 DIAGNOSIS — M13862 Other specified arthritis, left knee: Secondary | ICD-10-CM | POA: Diagnosis not present

## 2018-07-22 DIAGNOSIS — M13862 Other specified arthritis, left knee: Secondary | ICD-10-CM | POA: Diagnosis not present

## 2018-07-23 ENCOUNTER — Ambulatory Visit: Payer: Medicare Other | Admitting: Podiatry

## 2018-07-24 ENCOUNTER — Ambulatory Visit: Payer: Medicare Other | Admitting: Podiatry

## 2018-07-24 DIAGNOSIS — M13862 Other specified arthritis, left knee: Secondary | ICD-10-CM | POA: Diagnosis not present

## 2018-07-29 DIAGNOSIS — M13862 Other specified arthritis, left knee: Secondary | ICD-10-CM | POA: Diagnosis not present

## 2018-07-31 ENCOUNTER — Ambulatory Visit (INDEPENDENT_AMBULATORY_CARE_PROVIDER_SITE_OTHER): Payer: Medicare Other | Admitting: Podiatry

## 2018-07-31 DIAGNOSIS — B351 Tinea unguium: Secondary | ICD-10-CM

## 2018-07-31 DIAGNOSIS — M79609 Pain in unspecified limb: Secondary | ICD-10-CM

## 2018-07-31 DIAGNOSIS — M13862 Other specified arthritis, left knee: Secondary | ICD-10-CM | POA: Diagnosis not present

## 2018-08-02 NOTE — Progress Notes (Signed)
  Subjective:  [name], female    DOB: 1941-03-30,  MRN: [id number]  Chief Complaint  Patient presents with  . Nail Problem    3 month nail trim   77 y.o. female returns for the above complaint. Reports pain in her nails. States even the bedsheets hitting them cause her pain because they ingrow so much.  Objective:  There were no vitals filed for this visit. General AA&O x3. Normal mood and affect.  Vascular Pedal pulses palpable.  Neurologic Epicritic sensation grossly intact.  Dermatologic No open lesions. Skin normal texture and turgor. Toenails x 10 elongated, thickened, dystrophic.  Orthopedic: Pain to palpation about the toenails.   Assessment & Plan:  Patient was evaluated and treated and all questions answered.  Onychomycosis with pain  -Nails palliatively debrided as below. -Educated on self-care  Procedure: Nail Debridement Rationale: pain  Type of Debridement: manual, sharp debridement. Instrumentation: Nail nipper, rotary burr. Number of Nails: 10     No follow-ups on file.

## 2018-08-04 DIAGNOSIS — D126 Benign neoplasm of colon, unspecified: Secondary | ICD-10-CM | POA: Diagnosis not present

## 2018-08-04 DIAGNOSIS — Z86010 Personal history of colonic polyps: Secondary | ICD-10-CM | POA: Diagnosis not present

## 2018-08-04 DIAGNOSIS — K5730 Diverticulosis of large intestine without perforation or abscess without bleeding: Secondary | ICD-10-CM | POA: Diagnosis not present

## 2018-08-07 DIAGNOSIS — D126 Benign neoplasm of colon, unspecified: Secondary | ICD-10-CM | POA: Diagnosis not present

## 2018-09-23 ENCOUNTER — Other Ambulatory Visit: Payer: Self-pay | Admitting: Internal Medicine

## 2018-10-02 ENCOUNTER — Other Ambulatory Visit (INDEPENDENT_AMBULATORY_CARE_PROVIDER_SITE_OTHER): Payer: Medicare Other

## 2018-10-02 DIAGNOSIS — E042 Nontoxic multinodular goiter: Secondary | ICD-10-CM | POA: Diagnosis not present

## 2018-10-02 LAB — TSH: TSH: 0.82 u[IU]/mL (ref 0.35–4.50)

## 2018-10-02 LAB — T4, FREE: FREE T4: 1.07 ng/dL (ref 0.60–1.60)

## 2018-10-06 ENCOUNTER — Ambulatory Visit: Payer: Medicare Other | Admitting: Endocrinology

## 2018-10-06 NOTE — Progress Notes (Deleted)
[name], female   DOB: 31-Aug-1941, 77 y.o.   MRN: [id number]    Reason for Appointment: Goiter, followup    History of Present Illness:   The patient's thyroid enlargement was first discovered in early 2012 when she was having a CT scan of her neck Subsequent ultrasound showed a 4.6 cm left-sided thyroid nodule. Aspiration biopsy of this showed non-neoplastic goiter  Because of the increased size of the left lobe nodule another needle aspiration was done again in 7/14 which was also benign.  Previously because of the tendency for her thyroid nodule to be gradually increasing she was told to try levothyroxine 75 mcg empirically to help with preventing further enlargement However follow-up TSH in 9/15 was significantly low and levothyroxine was stopped  She is back here for her annual follow-up She has had no difficulty with swallowing    Does not feel like she has any choking sensation in her neck or pressure in any position  Thyroid levels are normal again  Her last ultrasound in 04/2013 showed the following: The dominant nodule in the lower pole of the left lobe measures 4.6 x 3.7 x 4.9 cm  Previously, it measured 4.6 x 3.0 x 3.6 cm.  Right lobe nodules are not significantly changed. Right upper pole nodule is 13 x 9 x 9 mm. Right lower pole nodule is 9 x 6 x 8 mm   Lab Results  Component Value Date   FREET4 1.07 10/02/2018   FREET4 1.08 09/23/2017   FREET4 0.97 07/07/2015   TSH 0.82 10/02/2018   TSH 0.84 09/23/2017   TSH 1.05 01/14/2017      Allergies as of 10/07/2018      Reactions   Penicillins Other (See Comments)   Chest pain   Codeine    Darvon       Medication List        Accurate as of 10/06/18  9:24 PM. Always use your most recent med list.          amLODipine 5 MG tablet Commonly known as:  NORVASC TAKE 1 TABLET(5 MG) BY MOUTH DAILY   azithromycin 250 MG tablet Commonly known as:  ZITHROMAX 2 po day 1 followed by  one po days 2-5   CALTRATE 600+D PLUS 600-400 MG-UNIT per tablet Chew 1 tablet by mouth daily.   clotrimazole-betamethasone cream Commonly known as:  LOTRISONE Apply 1 application topically 2 (two) times daily.   econazole nitrate 1 % cream Reported on 07/11/2016   Fish Oil 1000 MG Cpdr Take by mouth.   Garlic 025 MG Tabs Take 1,000 mg by mouth.   loratadine 10 MG tablet Commonly known as:  CLARITIN Take 1 tablet (10 mg total) by mouth daily.   MAXZIDE 75-50 MG tablet Generic drug:  triamterene-hydrochlorothiazide TAKE 1 TABLET BY MOUTH DAILY   multivitamin tablet Take 1 tablet by mouth daily.   VITAMIN D (CHOLECALCIFEROL) PO Take by mouth.       Allergies:  Allergies  Allergen Reactions  . Penicillins Other (See Comments)    Chest pain  . Codeine   . Darvon     Past Medical History:  Diagnosis Date  . Allergy   . Cancer (HCC)    cervical  . Hyperlipidemia   . Hypertension   . Obesity   . Osteopenia   . Vitamin D deficiency     Past Surgical History:  Procedure Laterality Date  . ABDOMINAL HYSTERECTOMY    .  EYE SURGERY    . HAMMER TOE SURGERY  12/02   right    Family History  Problem Relation Age of Onset  . Hypertension Mother   . Heart disease Father   . Hypertension Father   . Thyroid disease Neg Hx     Social History:  reports that she has never smoked. She has never used smokeless tobacco. She reports that she does not drink alcohol or use drugs.   Review of Systems:  There is a  history of high blood pressure Followed by PCP .                Examination:   There were no vitals taken for this visit.           She looks well       Thyroid exam: She has a nodule on  the left lobe and isthmus which is mostly palpable on swallowing.  This is smooth and slightly firm and is about 3-4 cm in size, difficult to estimate Right lobe is just palpable on swallowing also and mostly medially  There is no lymphadenopathy in the neck .      Deep tendon reflexes  at biceps are normal.   Assessment/Plan:  Multinodular goiter with dominant left lobe nodule which has been proven benign by biopsy twice This measures about 5 cm on ultrasound done in 2014 She has no local pressure symptoms or difficulty swallowing  Clinically her thyroid nodule does appear about the same Thyroid levels are normal again Unable to use levothyroxine for suppression since she had a suppressed TSH only on 75 g  She will followup annually for evaluation   Elayne Snare 10/06/2018

## 2018-10-07 ENCOUNTER — Ambulatory Visit (INDEPENDENT_AMBULATORY_CARE_PROVIDER_SITE_OTHER): Payer: Medicare Other | Admitting: Endocrinology

## 2018-10-07 ENCOUNTER — Encounter: Payer: Self-pay | Admitting: Endocrinology

## 2018-10-07 ENCOUNTER — Encounter: Payer: Medicare Other | Admitting: Endocrinology

## 2018-10-07 VITALS — BP 152/80 | HR 110 | Ht 63.5 in | Wt 177.0 lb

## 2018-10-07 DIAGNOSIS — E042 Nontoxic multinodular goiter: Secondary | ICD-10-CM | POA: Diagnosis not present

## 2018-10-07 NOTE — Progress Notes (Signed)
[name], female   DOB: April 22, 1941, 77 y.o.   MRN: [id number]    Reason for Appointment: Goiter, followup    History of Present Illness:   The patient's thyroid enlargement was first discovered in early 2012 when she was having a CT scan of her neck Subsequent ultrasound showed a 4.6 cm left-sided thyroid nodule. Aspiration biopsy of this showed non-neoplastic goiter  Because of the increased size of the left lobe nodule another needle aspiration was done again in 7/14 which was also benign.  Previously because of the tendency for her thyroid nodule to be gradually increasing she was told to try levothyroxine 75 mcg empirically to help with preventing further enlargement However follow-up TSH in 9/15 was significantly low and levothyroxine was stopped  Recent history: Her thyroid exam is on her last visit a year ago was stable and thyroid levels are been consistently normal  She does not feel any local discomfort, pressure in her neck or difficulty swallowing  Her last ultrasound in 04/2013 showed the following: The dominant nodule in the lower pole of the left lobe measures 4.6 x 3.7 x 4.9 cm  Previously, it measured 4.6 x 3.0 x 3.6 cm.  Right lobe nodules are not significantly changed. Right upper pole nodule is 13 x 9 x 9 mm. Right lower pole nodule is 9 x 6 x 8 mm   Lab Results  Component Value Date   FREET4 1.07 10/02/2018   FREET4 1.08 09/23/2017   FREET4 0.97 07/07/2015   TSH 0.82 10/02/2018   TSH 0.84 09/23/2017   TSH 1.05 01/14/2017      Allergies as of 10/07/2018      Reactions   Penicillins Other (See Comments)   Chest pain   Codeine    Darvon       Medication List        Accurate as of 10/07/18  1:30 PM. Always use your most recent med list.          amLODipine 5 MG tablet Commonly known as:  NORVASC TAKE 1 TABLET(5 MG) BY MOUTH DAILY   CALTRATE 600+D PLUS 600-400 MG-UNIT per tablet Chew 1 tablet by mouth daily.     clotrimazole-betamethasone cream Commonly known as:  LOTRISONE Apply 1 application topically 2 (two) times daily.   econazole nitrate 1 % cream Reported on 07/11/2016   Fish Oil 1000 MG Cpdr Take by mouth.   Garlic 025 MG Tabs Take 1,000 mg by mouth.   loratadine 10 MG tablet Commonly known as:  CLARITIN Take 1 tablet (10 mg total) by mouth daily.   MAXZIDE 75-50 MG tablet Generic drug:  triamterene-hydrochlorothiazide TAKE 1 TABLET BY MOUTH DAILY   multivitamin tablet Take 1 tablet by mouth daily.   VITAMIN D (CHOLECALCIFEROL) PO Take by mouth.       Allergies:  Allergies  Allergen Reactions  . Penicillins Other (See Comments)    Chest pain  . Codeine   . Darvon     Past Medical History:  Diagnosis Date  . Allergy   . Cancer (HCC)    cervical  . Hyperlipidemia   . Hypertension   . Obesity   . Osteopenia   . Vitamin D deficiency     Past Surgical History:  Procedure Laterality Date  . ABDOMINAL HYSTERECTOMY    . EYE SURGERY    . HAMMER TOE SURGERY  12/02   right    Family History  Problem Relation Age of  Onset  . Hypertension Mother   . Heart disease Father   . Hypertension Father   . Thyroid disease Neg Hx     Social History:  reports that she has never smoked. She has never used smokeless tobacco. She reports that she does not drink alcohol or use drugs.   Review of Systems:  There is a  history of high blood pressure Followed by PCP .                Examination:   BP (!) 152/80   Pulse (!) 110   Ht 5' 3.5" (1.613 m)   Wt 177 lb (80.3 kg)   SpO2 98%   BMI 30.86 kg/m                 Thyroid exam: She has a nodule palpable on the lateral part of the left lobe which is firm and about 2 cm in size Also appears to have a separate nodule in the medial left lobe and isthmus which is mostly palpable on swallowing and relatively smooth and slightly firm This measures 3-4 cm in size approximately  Right lobe is just palpable on  swallowing and is firm, slightly nodular   There is no lymphadenopathy in the neck .       Assessment/Plan:  Multinodular goiter with dominant left lower pole nodule which has been proven benign by biopsy twice This measures about 5 cm on ultrasound done in 2014 However today not clear if there is a separate lateral nodule on the left side  She has no local pressure symptoms or difficulty swallowing  Discussed that because of the possible new finding of a left lateral lobe nodule will repeat her ultrasound which has not been done since 2014  She will followup annually for evaluation   Elayne Snare 10/07/2018

## 2018-10-08 ENCOUNTER — Other Ambulatory Visit: Payer: Self-pay

## 2018-10-08 ENCOUNTER — Ambulatory Visit (INDEPENDENT_AMBULATORY_CARE_PROVIDER_SITE_OTHER): Payer: Medicare Other | Admitting: Podiatry

## 2018-10-08 DIAGNOSIS — M79609 Pain in unspecified limb: Secondary | ICD-10-CM

## 2018-10-08 DIAGNOSIS — B351 Tinea unguium: Secondary | ICD-10-CM

## 2018-10-08 DIAGNOSIS — L608 Other nail disorders: Secondary | ICD-10-CM

## 2018-10-08 NOTE — Progress Notes (Signed)
  Subjective:  [name], female    DOB: 18-Sep-1941,  MRN: [id number]  Chief Complaint  Patient presents with  . debride    BL nail trimming -pt does not have any complaints   77 y.o. female presents with the above complaint.  Reports painfully elongated nails to both feet.  Review of Systems: Negative except as noted in the HPI. Denies N/V/F/Ch.  Past Medical History:  Diagnosis Date  . Allergy   . Cancer (HCC)    cervical  . Hyperlipidemia   . Hypertension   . Obesity   . Osteopenia   . Vitamin D deficiency     Current Outpatient Medications:  .  amLODipine (NORVASC) 5 MG tablet, TAKE 1 TABLET(5 MG) BY MOUTH DAILY, Disp: 90 tablet, Rfl: 0 .  Calcium Carbonate-Vit D-Min (CALTRATE 600+D PLUS) 600-400 MG-UNIT per tablet, Chew 1 tablet by mouth daily.  , Disp: , Rfl:  .  Cetirizine HCl (ZYRTEC ALLERGY) 10 MG CAPS, Zyrtec 10 mg capsule  Take by oral route., Disp: , Rfl:  .  clotrimazole-betamethasone (LOTRISONE) cream, Apply 1 application topically 2 (two) times daily., Disp: 45 g, Rfl: prn .  diclofenac sodium (VOLTAREN) 1 % GEL, diclofenac 1 % topical gel  APP 2 GRAMS TOPICALLY AA TID., Disp: , Rfl:  .  econazole nitrate 1 % cream, Reported on 07/11/2016, Disp: , Rfl:  .  Garlic 841 MG TABS, Take 1,000 mg by mouth., Disp: , Rfl:  .  ketoconazole (NIZORAL) 2 % cream, ketoconazole 2 % topical cream, Disp: , Rfl:  .  loratadine (CLARITIN) 10 MG tablet, Take 1 tablet (10 mg total) by mouth daily., Disp: 30 tablet, Rfl: 11 .  MAXZIDE 75-50 MG tablet, TAKE 1 TABLET BY MOUTH DAILY, Disp: 90 tablet, Rfl: 3 .  Multiple Vitamin (MULTIVITAMIN) tablet, Take 1 tablet by mouth daily.  , Disp: , Rfl:  .  Omega-3 Fatty Acids (FISH OIL) 1000 MG CPDR, Take by mouth., Disp: , Rfl:  .  VITAMIN D, CHOLECALCIFEROL, PO, Take by mouth.  , Disp: , Rfl:   Social History   Tobacco Use  Smoking Status Never Smoker  Smokeless Tobacco Never Used    Allergies  Allergen Reactions  .  Penicillins Other (See Comments)    Chest pain  . Codeine   . Darvon    Objective:  There were no vitals filed for this visit. There is no height or weight on file to calculate BMI. Constitutional Well developed. Well nourished.  Vascular Dorsalis pedis pulses palpable bilaterally. Posterior tibial pulses palpable bilaterally. Capillary refill normal to all digits.  No cyanosis or clubbing noted. Pedal hair growth normal.  Neurologic Normal speech. Oriented to person, place, and time. Epicritic sensation to light touch grossly present bilaterally.  Dermatologic Nails elongated dystrophic pain to palpation No open wounds. No skin lesions.  Orthopedic: Normal joint ROM without pain or crepitus bilaterally. No visible deformities. No bony tenderness.   Radiographs: None Assessment:   1. Pain due to onychomycosis of nail   2. Pincer nail deformity    Plan:  Patient was evaluated and treated and all questions answered.  Onychomycosis with pain -Nails palliatively debridement as below -Educated on self-care  Procedure: Nail Debridement Rationale: Pain Type of Debridement: manual, sharp debridement. Instrumentation: Nail nipper, rotary burr. Number of Nails: 10 Visit  Return in about 2 months (around 12/09/2018) for Foot care.

## 2018-10-09 ENCOUNTER — Ambulatory Visit: Payer: Medicare Other | Admitting: Podiatry

## 2018-11-03 ENCOUNTER — Ambulatory Visit
Admission: RE | Admit: 2018-11-03 | Discharge: 2018-11-03 | Disposition: A | Discharge status: Home / Self Care | Payer: Medicare Other | Source: Ambulatory Visit | Attending: Endocrinology | Admitting: Endocrinology

## 2018-11-03 DIAGNOSIS — E042 Nontoxic multinodular goiter: Secondary | ICD-10-CM

## 2018-11-04 ENCOUNTER — Other Ambulatory Visit: Payer: Self-pay | Admitting: Internal Medicine

## 2018-11-04 DIAGNOSIS — E8881 Metabolic syndrome: Secondary | ICD-10-CM

## 2018-11-04 DIAGNOSIS — E119 Type 2 diabetes mellitus without complications: Secondary | ICD-10-CM

## 2018-11-04 DIAGNOSIS — E042 Nontoxic multinodular goiter: Secondary | ICD-10-CM

## 2018-11-04 DIAGNOSIS — F411 Generalized anxiety disorder: Secondary | ICD-10-CM

## 2018-11-04 DIAGNOSIS — E049 Nontoxic goiter, unspecified: Secondary | ICD-10-CM

## 2018-11-04 DIAGNOSIS — E7800 Pure hypercholesterolemia, unspecified: Secondary | ICD-10-CM

## 2018-11-04 DIAGNOSIS — M1712 Unilateral primary osteoarthritis, left knee: Secondary | ICD-10-CM

## 2018-11-04 DIAGNOSIS — R7302 Impaired glucose tolerance (oral): Secondary | ICD-10-CM

## 2018-11-06 ENCOUNTER — Telehealth: Payer: Self-pay | Admitting: Endocrinology

## 2018-11-06 ENCOUNTER — Other Ambulatory Visit: Payer: Medicare Other | Admitting: Internal Medicine

## 2018-11-06 DIAGNOSIS — E049 Nontoxic goiter, unspecified: Secondary | ICD-10-CM

## 2018-11-06 DIAGNOSIS — E042 Nontoxic multinodular goiter: Secondary | ICD-10-CM

## 2018-11-06 DIAGNOSIS — R7302 Impaired glucose tolerance (oral): Secondary | ICD-10-CM

## 2018-11-06 DIAGNOSIS — E8881 Metabolic syndrome: Secondary | ICD-10-CM | POA: Diagnosis not present

## 2018-11-06 DIAGNOSIS — M1712 Unilateral primary osteoarthritis, left knee: Secondary | ICD-10-CM

## 2018-11-06 DIAGNOSIS — E7800 Pure hypercholesterolemia, unspecified: Secondary | ICD-10-CM | POA: Diagnosis not present

## 2018-11-06 DIAGNOSIS — F411 Generalized anxiety disorder: Secondary | ICD-10-CM | POA: Diagnosis not present

## 2018-11-06 NOTE — Telephone Encounter (Signed)
Patient is returning call about lab results. Please call on cell # [phone]. Can leave detailed message. Thanks!

## 2018-11-06 NOTE — Telephone Encounter (Signed)
LVM informing pt of results.

## 2018-11-07 LAB — COMPLETE METABOLIC PANEL WITH GFR
AG Ratio: 1.3 (calc) (ref 1.0–2.5)
ALBUMIN MSPROF: 4.3 g/dL (ref 3.6–5.1)
ALKALINE PHOSPHATASE (APISO): 82 U/L (ref 33–130)
ALT: 14 U/L (ref 6–29)
AST: 20 U/L (ref 10–35)
BUN: 21 mg/dL (ref 7–25)
CO2: 27 mmol/L (ref 20–32)
CREATININE: 0.87 mg/dL (ref 0.60–0.93)
Calcium: 9.7 mg/dL (ref 8.6–10.4)
Chloride: 99 mmol/L (ref 98–110)
GFR, Est African American: 76 mL/min/{1.73_m2} (ref >=60)
GFR, Est Non African American: 66 mL/min/{1.73_m2} (ref >=60)
GLUCOSE: 102 mg/dL — AB (ref 65–99)
Globulin: 3.2 g/dL (calc) (ref 1.9–3.7)
Potassium: 3.3 mmol/L — ABNORMAL LOW (ref 3.5–5.3)
Sodium: 137 mmol/L (ref 135–146)
Total Bilirubin: 0.9 mg/dL (ref 0.2–1.2)
Total Protein: 7.5 g/dL (ref 6.1–8.1)

## 2018-11-07 LAB — LIPID PANEL
Cholesterol: 234 mg/dL — ABNORMAL HIGH (ref <200)
HDL: 68 mg/dL (ref >50)
LDL CHOLESTEROL (CALC): 147 mg/dL — AB
Non-HDL Cholesterol (Calc): 166 mg/dL (calc) — ABNORMAL HIGH (ref <130)
Total CHOL/HDL Ratio: 3.4 (calc) (ref <5.0)
Triglycerides: 83 mg/dL (ref <150)

## 2018-11-07 LAB — CBC WITH DIFFERENTIAL/PLATELET
BASOS ABS: 28 {cells}/uL (ref 0–200)
Basophils Relative: 0.5 %
EOS PCT: 0.7 %
Eosinophils Absolute: 39 cells/uL (ref 15–500)
HCT: 40 % (ref 35.0–45.0)
Hemoglobin: 13.2 g/dL (ref 11.7–15.5)
Lymphs Abs: 1092 cells/uL (ref 850–3900)
MCH: 25 pg — ABNORMAL LOW (ref 27.0–33.0)
MCHC: 33 g/dL (ref 32.0–36.0)
MCV: 75.8 fL — AB (ref 80.0–100.0)
MPV: 9.6 fL (ref 7.5–12.5)
Monocytes Relative: 6.3 %
NEUTROS PCT: 73 %
Neutro Abs: 4088 cells/uL (ref 1500–7800)
PLATELETS: 287 10*3/uL (ref 140–400)
RBC: 5.28 10*6/uL — AB (ref 3.80–5.10)
RDW: 15 % (ref 11.0–15.0)
TOTAL LYMPHOCYTE: 19.5 %
WBC mixed population: 353 cells/uL (ref 200–950)
WBC: 5.6 10*3/uL (ref 3.8–10.8)

## 2018-11-07 LAB — HEMOGLOBIN A1C
Hgb A1c MFr Bld: 5.7 % of total Hgb — ABNORMAL HIGH (ref <5.7)
MEAN PLASMA GLUCOSE: 117 (calc)
eAG (mmol/L): 6.5 (calc)

## 2018-11-07 LAB — TSH: TSH: 1.18 m[IU]/L (ref 0.40–4.50)

## 2018-11-07 LAB — MICROALBUMIN / CREATININE URINE RATIO
Creatinine, Urine: 96 mg/dL (ref 20–275)
MICROALB/CREAT RATIO: 13 ug/mg{creat} (ref <30)
Microalb, Ur: 1.2 mg/dL

## 2018-11-09 ENCOUNTER — Encounter: Payer: Self-pay | Admitting: Internal Medicine

## 2018-11-09 ENCOUNTER — Ambulatory Visit (INDEPENDENT_AMBULATORY_CARE_PROVIDER_SITE_OTHER): Payer: Medicare Other | Admitting: Internal Medicine

## 2018-11-09 VITALS — BP 110/70 | HR 124 | Temp 98.8°F | Ht 63.5 in | Wt 181.0 lb

## 2018-11-09 DIAGNOSIS — J309 Allergic rhinitis, unspecified: Secondary | ICD-10-CM

## 2018-11-09 DIAGNOSIS — Z8541 Personal history of malignant neoplasm of cervix uteri: Secondary | ICD-10-CM

## 2018-11-09 DIAGNOSIS — R7302 Impaired glucose tolerance (oral): Secondary | ICD-10-CM

## 2018-11-09 DIAGNOSIS — Z0000 Encounter for general adult medical examination without abnormal findings: Secondary | ICD-10-CM

## 2018-11-09 DIAGNOSIS — M1712 Unilateral primary osteoarthritis, left knee: Secondary | ICD-10-CM

## 2018-11-09 DIAGNOSIS — F439 Reaction to severe stress, unspecified: Secondary | ICD-10-CM

## 2018-11-09 DIAGNOSIS — I10 Essential (primary) hypertension: Secondary | ICD-10-CM | POA: Diagnosis not present

## 2018-11-09 DIAGNOSIS — E8881 Metabolic syndrome: Secondary | ICD-10-CM | POA: Diagnosis not present

## 2018-11-09 DIAGNOSIS — F411 Generalized anxiety disorder: Secondary | ICD-10-CM | POA: Diagnosis not present

## 2018-11-09 DIAGNOSIS — E7800 Pure hypercholesterolemia, unspecified: Secondary | ICD-10-CM | POA: Diagnosis not present

## 2018-11-09 DIAGNOSIS — J329 Chronic sinusitis, unspecified: Secondary | ICD-10-CM

## 2018-11-09 DIAGNOSIS — E049 Nontoxic goiter, unspecified: Secondary | ICD-10-CM

## 2018-11-09 LAB — POCT URINALYSIS DIPSTICK
APPEARANCE: NEGATIVE
Bilirubin, UA: NEGATIVE
GLUCOSE UA: NEGATIVE
Ketones, UA: NEGATIVE
LEUKOCYTES UA: NEGATIVE
Nitrite, UA: NEGATIVE
Odor: NEGATIVE
PROTEIN UA: NEGATIVE
RBC UA: NEGATIVE
Spec Grav, UA: 1.015 (ref 1.010–1.025)
Urobilinogen, UA: 0.2 E.U./dL
pH, UA: 6 (ref 5.0–8.0)

## 2018-11-09 MED ORDER — POTASSIUM CHLORIDE ER 10 MEQ PO TBCR: 10.0000 meq | EXTENDED_RELEASE_TABLET | Freq: Every day | ORAL | 1 refills | Status: DC

## 2018-11-09 MED ORDER — METHYLPREDNISOLONE ACETATE 80 MG/ML IJ SUSP
80.0000 mg | Freq: Once | INTRAMUSCULAR | Status: AC
  Administered 2018-11-09: 80 mg via INTRAMUSCULAR

## 2018-11-13 DIAGNOSIS — M1712 Unilateral primary osteoarthritis, left knee: Secondary | ICD-10-CM | POA: Diagnosis not present

## 2018-11-20 ENCOUNTER — Other Ambulatory Visit: Payer: Self-pay | Admitting: Internal Medicine

## 2018-11-20 ENCOUNTER — Ambulatory Visit (INDEPENDENT_AMBULATORY_CARE_PROVIDER_SITE_OTHER): Payer: Medicare Other | Admitting: Endocrinology

## 2018-11-20 DIAGNOSIS — E876 Hypokalemia: Secondary | ICD-10-CM

## 2018-11-20 DIAGNOSIS — Z23 Encounter for immunization: Secondary | ICD-10-CM

## 2018-11-20 NOTE — Progress Notes (Signed)
After obtaining consent, and per orders of Dr. Dwyane Dee, injection of Fluvarix 0.5mg /mL given in left deltoid IM by Tracia Lacomb Berneta Sages. Patient tolerated well. Patient instructed to remain in clinic for 20 minutes afterwards, and to report any adverse reaction to me immediately.

## 2018-11-23 ENCOUNTER — Other Ambulatory Visit: Payer: Medicare Other | Admitting: Internal Medicine

## 2018-11-23 DIAGNOSIS — E876 Hypokalemia: Secondary | ICD-10-CM | POA: Diagnosis not present

## 2018-11-24 LAB — POTASSIUM: POTASSIUM: 3.8 mmol/L (ref 3.5–5.3)

## 2018-11-28 NOTE — Progress Notes (Signed)
Subjective:    [name], female    DOB: 31-Oct-1941, 77 y.o.   MRN: [id number]  HPI 77 year old Black Female in today for health maintenance exam and evaluation of medical issues in addition to Medicare wellness exam.  She has a history of essential hypertension, hyperlipidemia, multi-nodular goiter followed by endocrinologist, impaired glucose tolerance, anxiety, history of glaucoma, allergic rhinitis and osteoarthritis of knees.  History of benign colon polyp.  She had ultrasound-guided needle biopsy aspirate of the thyroid gland in 77  Biopsy was benign.  Had large colon polyp removed at [facility] in [place] that was benign a number of years ago.  She is on thyroid suppression by Dr. Dwyane Dee for goiter.  History of hemorrhoids and rectal itching for which she uses clotrimazole.  Has been on Maxide 75/50 since 1999 for hypertension.  In 2010 amlodipine was added for improved control of blood pressure.  History of anxiety depression.  Husband is chronically ill in a nursing home.  Hysterectomy without oophorectomy for cervical cancer in 1977 in [place].  Dr. Novella Olive now has GYN exam.  Right hammertoe surgery 2002.  History of vitamin D deficiency.  She is intolerant of penicillin-causes chest pain.  She is also intolerant of codeine.  Social history: She is married.  Husband is in a nursing home with dementia.  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  She is retired Pharmacist, hospital.  No children.  Husband is retired Firefighter.  Sister is now living here in [place] and that has caused some stress.  She moved here from [place] after patient's mother died with complications of dementia.  She refuses to spend much time in her own apartment.  Family history: Mother deceased with history of hypertension and dementia.  She had kidney failure.  Sister now lives here and apparently is healthy.  Father deceased with history of  diabetes and hypertension.   Review of Systems  Respiratory: Negative.   Cardiovascular: Negative.   Gastrointestinal: Negative.   Genitourinary: Negative.   Musculoskeletal:       Chronic left knee pain  Psychiatric/Behavioral:       Anxiety       Objective:   Physical Exam  Constitutional: She is oriented to person, place, and time. She appears well-developed and well-nourished. No distress.  HENT:  Head: Normocephalic and atraumatic.  Right Ear: External ear normal.  Left Ear: External ear normal.  Mouth/Throat: Oropharynx is clear and moist. No oropharyngeal exudate.  Eyes: Conjunctivae are normal. Right eye exhibits no discharge. Left eye exhibits no discharge.  Neck: Neck supple. No JVD present.  Mild thyromegaly  Cardiovascular: Normal rate, regular rhythm and normal heart sounds.  No murmur heard. Pulmonary/Chest: Effort normal and breath sounds normal. No stridor. No respiratory distress. She has no wheezes. She has no rales.  Breasts normal female without masses  Abdominal: Soft. Bowel sounds are normal. She exhibits no distension and no mass. There is no tenderness. There is no rebound and no guarding.  Genitourinary:  Genitourinary Comments: Deferred to GYN  Musculoskeletal: She exhibits no edema.  Lymphadenopathy:    She has no cervical adenopathy.  Neurological: She is alert and oriented to person, place, and time. She displays normal reflexes. No cranial nerve deficit or sensory deficit. She exhibits normal muscle tone. Coordination normal.  Skin: Skin is warm and dry. No rash noted. She is not diaphoretic.  Psychiatric: She has a normal mood and affect. Her  behavior is normal. Judgment and thought content normal.  Vitals reviewed.         Assessment & Plan:  Essential hypertension-her blood pressure is stable today.  She is a bit tachycardic but indicates she is irritated with her sister.  Continue to monitor her pulse at home.  Osteoarthritis of left  knee-long-standing  History of anxiety treated with Enzo diazepam medication  Situational stress with sister  Allergic rhinitis-Depo-Medrol 80 mg IM given today.  History of impaired glucose tolerance-hemoglobin A1c 5.7%  History of cervical cancer  History of benign colon polyp para graph history of low MCV  Hyperlipidemia-total cholesterol was 234 and previously was 220.  LDL is 147.  Spoke with her about low-dose statin medication 3 times a week but she does not want to do that.  Follow-up in 6 months.  Goiter followed by Dr. Dwyane Dee and on thyroid suppression medication.  TSH normal at 1.18  Plan: Continue current medications and follow-up in 6 months.  Continue to work on diet exercise and weight loss.  Continue same medications.  Depo-Medrol 80 mg IM given for allergic rhinitis symptoms.  Subjective:   Patient presents for Medicare Annual/Subsequent preventive examination.  Review Past Medical/Family/Social: See above   Risk Factors  Current exercise habits: Does not get a lot of exercise due to osteoarthritis and left knee at present time Dietary issues discussed: Low-fat low carbohydrate  Cardiac risk factors: Hyperlipidemia  Depression Screen  (Note: if answer to either of the following is "Yes", a more complete depression screening is indicated)   Over the past two weeks, have you felt down, depressed or hopeless? No  Over the past two weeks, have you felt little interest or pleasure in doing things? No Have you lost interest or pleasure in daily life? No Do you often feel hopeless? No Do you cry easily over simple problems? No   Activities of Daily Living  In your present state of health, do you have any difficulty performing the following activities?:   Driving? No  Managing money? No  Feeding yourself? No  Getting from bed to chair? No  Climbing a flight of stairs? No  Preparing food and eating?: No  Bathing or showering? No  Getting dressed: No  Getting  to the toilet? No  Using the toilet:No  Moving around from place to place: Yes due to osteoarthritis in left knee.  Seeing Dr. Maureen Ralphs In the past year have you fallen or had a near fall?:No  Are you sexually active? No  Do you have more than one partner? No   Hearing Difficulties: No  Do you often ask people to speak up or repeat themselves? No  Do you experience ringing or noises in your ears? No  Do you have difficulty understanding soft or whispered voices?  Occasionally Do you feel that you have a problem with memory? No Do you often misplace items? No    Home Safety:  Do you have a smoke alarm at your residence? Yes Do you have grab bars in the bathroom?  Yes Do you have throw rugs in your house?  No   Cognitive Testing  Alert? Yes Normal Appearance?Yes  Oriented to person? Yes Place? Yes  Time? Yes  Recall of three objects? Yes  Can perform simple calculations? Yes  Displays appropriate judgment?Yes  Can read the correct time from a watch face?Yes   List the Names of Other Physician/Practitioners you currently use:  See referral list for the physicians patient is  currently seeing.     Review of Systems: See above   Objective:     General appearance: Appears stated age and  obese  Head: Normocephalic, without obvious abnormality, atraumatic  Eyes: conj clear, EOMi PEERLA  Ears: normal TM's and external ear canals both ears  Nose: Nares normal. Septum midline. Mucosa normal. No drainage or sinus tenderness.  Throat: lips, mucosa, and tongue normal; teeth and gums normal  Neck: no adenopathy, no carotid bruit, no JVD, supple, symmetrical, goiter present symmetric, no tenderness/mass/nodules  No CVA tenderness.  Lungs: clear to auscultation bilaterally  Breasts: normal appearance, no masses or tenderness,  Heart: regular rate and rhythm, S1, S2 normal, no murmur, click, rub or gallop  Abdomen: soft, non-tender; bowel sounds normal; no masses, no organomegaly    Musculoskeletal: ROM normal in all joints, no crepitus, no deformity, Normal muscle strengthen. Back  is symmetric, no curvature. Skin: Skin color, texture, turgor normal. No rashes or lesions  Lymph nodes: Cervical, supraclavicular, and axillary nodes normal.  Neurologic: CN 2 -12 Normal, Normal symmetric reflexes. Normal coordination and gait  Psych: Alert & Oriented x 3, Mood appear stable.    Assessment:    Annual wellness medicare exam   Plan:    During the course of the visit the patient was educated and counseled about appropriate screening and preventive services including:        Patient Instructions (the written plan) was given to the patient.  Medicare Attestation  I have personally reviewed:  The patient's medical and social history  Their use of alcohol, tobacco or illicit drugs  Their current medications and supplements  The patient's functional ability including ADLs,fall risks, home safety risks, cognitive, and hearing and visual impairment  Diet and physical activities  Evidence for depression or mood disorders  The patient's weight, height, BMI, and visual acuity have been recorded in the chart. I have made referrals, counseling, and provided education to the patient based on review of the above and I have provided the patient with a written personalized care plan for preventive services.

## 2018-11-28 NOTE — Patient Instructions (Addendum)
Continue same medications and return in 6 months.  Please watch diet and try to get some exercise.  Depo-Medrol given today for allergy symptoms.  Might help knee as well.

## 2018-12-10 ENCOUNTER — Ambulatory Visit (INDEPENDENT_AMBULATORY_CARE_PROVIDER_SITE_OTHER): Payer: Medicare Other | Admitting: Podiatry

## 2018-12-10 ENCOUNTER — Ambulatory Visit (INDEPENDENT_AMBULATORY_CARE_PROVIDER_SITE_OTHER): Payer: Medicare Other

## 2018-12-10 DIAGNOSIS — B351 Tinea unguium: Secondary | ICD-10-CM

## 2018-12-10 DIAGNOSIS — M779 Enthesopathy, unspecified: Secondary | ICD-10-CM

## 2018-12-10 DIAGNOSIS — M778 Other enthesopathies, not elsewhere classified: Secondary | ICD-10-CM

## 2018-12-10 DIAGNOSIS — M2041 Other hammer toe(s) (acquired), right foot: Secondary | ICD-10-CM

## 2018-12-10 DIAGNOSIS — M79609 Pain in unspecified limb: Secondary | ICD-10-CM

## 2018-12-14 DIAGNOSIS — H40033 Anatomical narrow angle, bilateral: Secondary | ICD-10-CM | POA: Diagnosis not present

## 2018-12-14 DIAGNOSIS — H40013 Open angle with borderline findings, low risk, bilateral: Secondary | ICD-10-CM | POA: Diagnosis not present

## 2018-12-14 DIAGNOSIS — H25013 Cortical age-related cataract, bilateral: Secondary | ICD-10-CM | POA: Diagnosis not present

## 2018-12-14 DIAGNOSIS — H2513 Age-related nuclear cataract, bilateral: Secondary | ICD-10-CM | POA: Diagnosis not present

## 2019-01-12 ENCOUNTER — Telehealth: Payer: Self-pay | Admitting: Internal Medicine

## 2019-01-12 NOTE — Telephone Encounter (Signed)
I think the usual recommendations will be fine.

## 2019-01-12 NOTE — Telephone Encounter (Signed)
Can you recommend a psychiatrist?  She is wanting to see someone.   Is it ok for me to provide her with the usual names that we share with our patients?  Or, do you have someone in particular that you feel would be a good fit for her?  Phone #:  [phone]  Thank you.

## 2019-01-13 ENCOUNTER — Other Ambulatory Visit: Payer: Self-pay | Admitting: Internal Medicine

## 2019-01-13 DIAGNOSIS — M1712 Unilateral primary osteoarthritis, left knee: Secondary | ICD-10-CM | POA: Diagnosis not present

## 2019-01-15 NOTE — Telephone Encounter (Signed)
Spoke with patient this morning and provided the following information to her:  [facility] @ [phone] (Dr. Casimiro Needle, Noemi Chapel) [facility] @ [phone] (Dr Casimiro Needle, Dr. Adele Schilder, Dr. Doyne Keel) [facility] @ [phone] (Dr. Bobby Rumpf) [facility] @ [phone]  Patient is very grateful.

## 2019-01-20 DIAGNOSIS — M1712 Unilateral primary osteoarthritis, left knee: Secondary | ICD-10-CM | POA: Diagnosis not present

## 2019-01-20 DIAGNOSIS — M25562 Pain in left knee: Secondary | ICD-10-CM | POA: Diagnosis not present

## 2019-01-25 DIAGNOSIS — D485 Neoplasm of uncertain behavior of skin: Secondary | ICD-10-CM | POA: Diagnosis not present

## 2019-01-25 DIAGNOSIS — L820 Inflamed seborrheic keratosis: Secondary | ICD-10-CM | POA: Diagnosis not present

## 2019-01-25 DIAGNOSIS — L853 Xerosis cutis: Secondary | ICD-10-CM | POA: Diagnosis not present

## 2019-01-25 DIAGNOSIS — L821 Other seborrheic keratosis: Secondary | ICD-10-CM | POA: Diagnosis not present

## 2019-01-25 DIAGNOSIS — B078 Other viral warts: Secondary | ICD-10-CM | POA: Diagnosis not present

## 2019-01-27 DIAGNOSIS — M1712 Unilateral primary osteoarthritis, left knee: Secondary | ICD-10-CM | POA: Diagnosis not present

## 2019-02-01 DIAGNOSIS — T1590XS Foreign body on external eye, part unspecified, unspecified eye, sequela: Secondary | ICD-10-CM | POA: Diagnosis not present

## 2019-02-03 NOTE — Progress Notes (Signed)
Subjective:  [name], female    DOB: 28-Jun-1941,  MRN: [id number]  Chief Complaint  Patient presents with  . Nail Problem    2 month nail and corn trim   78 y.o. female presents with the above complaint.  Reports painfully elongated nails to both feet. Also complains of painful hammertoe deformities to the right foot.  Review of Systems: Negative except as noted in the HPI. Denies N/V/F/Ch.  Past Medical History:  Diagnosis Date  . Allergy   . Cancer (HCC)    cervical  . Hyperlipidemia   . Hypertension   . Obesity   . Osteopenia   . Vitamin D deficiency     Current Outpatient Medications:  .  amLODipine (NORVASC) 5 MG tablet, TAKE 1 TABLET(5 MG) BY MOUTH DAILY, Disp: 90 tablet, Rfl: 3 .  Calcium Carbonate-Vit D-Min (CALTRATE 600+D PLUS) 600-400 MG-UNIT per tablet, Chew 1 tablet by mouth daily.  , Disp: , Rfl:  .  Cetirizine HCl (ZYRTEC ALLERGY) 10 MG CAPS, Zyrtec 10 mg capsule  Take by oral route., Disp: , Rfl:  .  clotrimazole-betamethasone (LOTRISONE) cream, Apply 1 application topically 2 (two) times daily., Disp: 45 g, Rfl: prn .  diclofenac sodium (VOLTAREN) 1 % GEL, diclofenac 1 % topical gel  APP 2 GRAMS TOPICALLY AA TID., Disp: , Rfl:  .  econazole nitrate 1 % cream, Reported on 07/11/2016, Disp: , Rfl:  .  Garlic 725 MG TABS, Take 1,000 mg by mouth., Disp: , Rfl:  .  ketoconazole (NIZORAL) 2 % cream, ketoconazole 2 % topical cream, Disp: , Rfl:  .  loratadine (CLARITIN) 10 MG tablet, Take 1 tablet (10 mg total) by mouth daily., Disp: 30 tablet, Rfl: 11 .  MAXZIDE 75-50 MG tablet, TAKE 1 TABLET BY MOUTH DAILY, Disp: 90 tablet, Rfl: 3 .  Multiple Vitamin (MULTIVITAMIN) tablet, Take 1 tablet by mouth daily.  , Disp: , Rfl:  .  Omega-3 Fatty Acids (FISH OIL) 1000 MG CPDR, Take by mouth., Disp: , Rfl:  .  potassium chloride (K-DUR) 10 MEQ tablet, Take 1 tablet (10 mEq total) by mouth daily., Disp: 90 tablet, Rfl: 1 .  VITAMIN D, CHOLECALCIFEROL, PO,  Take by mouth.  , Disp: , Rfl:   Social History   Tobacco Use  Smoking Status Never Smoker  Smokeless Tobacco Never Used    Allergies  Allergen Reactions  . Penicillins Other (See Comments)    Chest pain  . Codeine   . Darvon    Objective:  There were no vitals filed for this visit. There is no height or weight on file to calculate BMI. Constitutional Well developed. Well nourished.  Vascular Dorsalis pedis pulses palpable bilaterally. Posterior tibial pulses palpable bilaterally. Capillary refill normal to all digits.  No cyanosis or clubbing noted. Pedal hair growth normal.  Neurologic Normal speech. Oriented to person, place, and time. Epicritic sensation to light touch grossly present bilaterally.  Dermatologic Nails elongated dystrophic pain to palpation No open wounds. No skin lesions.  Orthopedic: Normal joint ROM without pain or crepitus bilaterally. Lesser digital contractures right foot. HAV deformities No bony tenderness.   Radiographs: Taken and reviewed. HAV deformity. Lesser digital contractures Assessment:   1. Capsulitis of right foot    Plan:  Patient was evaluated and treated and all questions answered.  Onychomycosis with pain -Nails palliatively debridement as below -Educated on self-care  Procedure: Nail Debridement Rationale: Pain Type of Debridement: manual, sharp debridement. Instrumentation: Nail nipper, rotary  burr. Number of Nails: 10  HAV, Hammertoes -XR as above -Will discuss hammertoe correction at next visit if symptomatic.  Return in about 9 weeks (around 02/11/2019) for Hammmertoe and nail f/u.

## 2019-02-04 ENCOUNTER — Ambulatory Visit (INDEPENDENT_AMBULATORY_CARE_PROVIDER_SITE_OTHER): Payer: Medicare Other | Admitting: Podiatry

## 2019-02-04 ENCOUNTER — Ambulatory Visit: Payer: Medicare Other | Admitting: Podiatry

## 2019-02-04 ENCOUNTER — Encounter: Payer: Self-pay | Admitting: Podiatry

## 2019-02-04 DIAGNOSIS — B351 Tinea unguium: Secondary | ICD-10-CM | POA: Diagnosis not present

## 2019-02-04 DIAGNOSIS — M79676 Pain in unspecified toe(s): Secondary | ICD-10-CM | POA: Diagnosis not present

## 2019-02-11 ENCOUNTER — Ambulatory Visit: Payer: Medicare Other | Admitting: Podiatry

## 2019-02-27 NOTE — Progress Notes (Signed)
  Subjective:  [name], female    DOB: 02/18/1941,  MRN: [id number]  Chief Complaint  Patient presents with  . Nail Problem    bilateral debridement of painful nails    78 y.o. female presents with the above complaint.  Reports painfully elongated nails to both feet.  Review of Systems: Negative except as noted in the HPI. Denies N/V/F/Ch.  Past Medical History:  Diagnosis Date  . Allergy   . Cancer (HCC)    cervical  . Hyperlipidemia   . Hypertension   . Obesity   . Osteopenia   . Vitamin D deficiency     Current Outpatient Medications:  .  amLODipine (NORVASC) 5 MG tablet, TAKE 1 TABLET(5 MG) BY MOUTH DAILY, Disp: 90 tablet, Rfl: 3 .  Calcium Carbonate-Vit D-Min (CALTRATE 600+D PLUS) 600-400 MG-UNIT per tablet, Chew 1 tablet by mouth daily.  , Disp: , Rfl:  .  Cetirizine HCl (ZYRTEC ALLERGY) 10 MG CAPS, Zyrtec 10 mg capsule  Take by oral route., Disp: , Rfl:  .  clotrimazole-betamethasone (LOTRISONE) cream, Apply 1 application topically 2 (two) times daily., Disp: 45 g, Rfl: prn .  diclofenac sodium (VOLTAREN) 1 % GEL, diclofenac 1 % topical gel  APP 2 GRAMS TOPICALLY AA TID., Disp: , Rfl:  .  econazole nitrate 1 % cream, Reported on 07/11/2016, Disp: , Rfl:  .  Garlic 952 MG TABS, Take 1,000 mg by mouth., Disp: , Rfl:  .  ketoconazole (NIZORAL) 2 % cream, ketoconazole 2 % topical cream, Disp: , Rfl:  .  loratadine (CLARITIN) 10 MG tablet, Take 1 tablet (10 mg total) by mouth daily., Disp: 30 tablet, Rfl: 11 .  MAXZIDE 75-50 MG tablet, TAKE 1 TABLET BY MOUTH DAILY, Disp: 90 tablet, Rfl: 3 .  Multiple Vitamin (MULTIVITAMIN) tablet, Take 1 tablet by mouth daily.  , Disp: , Rfl:  .  Omega-3 Fatty Acids (FISH OIL) 1000 MG CPDR, Take by mouth., Disp: , Rfl:  .  potassium chloride (K-DUR) 10 MEQ tablet, Take 1 tablet (10 mEq total) by mouth daily., Disp: 90 tablet, Rfl: 1 .  VITAMIN D, CHOLECALCIFEROL, PO, Take by mouth.  , Disp: , Rfl:   Social History    Tobacco Use  Smoking Status Never Smoker  Smokeless Tobacco Never Used    Allergies  Allergen Reactions  . Penicillins Other (See Comments)    Chest pain  . Codeine   . Darvon    Objective:  There were no vitals filed for this visit. There is no height or weight on file to calculate BMI. Constitutional Well developed. Well nourished.  Vascular Dorsalis pedis pulses palpable bilaterally. Posterior tibial pulses palpable bilaterally. Capillary refill normal to all digits.  No cyanosis or clubbing noted. Pedal hair growth normal.  Neurologic Normal speech. Oriented to person, place, and time. Epicritic sensation to light touch grossly present bilaterally.  Dermatologic Nails elongated dystrophic pain to palpation No open wounds. No skin lesions.  Orthopedic: Normal joint ROM without pain or crepitus bilaterally. No visible deformities. No bony tenderness.   Radiographs: None Assessment:   1. Pain due to onychomycosis of toenail    Plan:  Patient was evaluated and treated and all questions answered.  Onychomycosis with pain -Nails palliatively debridement as below -Educated on self-care  Procedure: Nail Debridement Rationale: Pain Type of Debridement: manual, sharp debridement. Instrumentation: Nail nipper, rotary burr. Number of Nails: 10  No follow-ups on file.

## 2019-04-01 ENCOUNTER — Encounter: Payer: Self-pay | Admitting: Podiatry

## 2019-04-01 ENCOUNTER — Ambulatory Visit (INDEPENDENT_AMBULATORY_CARE_PROVIDER_SITE_OTHER): Payer: Medicare Other | Admitting: Podiatry

## 2019-04-01 ENCOUNTER — Other Ambulatory Visit: Payer: Self-pay

## 2019-04-01 VITALS — Temp 97.5°F

## 2019-04-01 DIAGNOSIS — B351 Tinea unguium: Secondary | ICD-10-CM | POA: Diagnosis not present

## 2019-04-01 DIAGNOSIS — M79676 Pain in unspecified toe(s): Secondary | ICD-10-CM

## 2019-04-01 NOTE — Progress Notes (Signed)
Subjective:  [name], female    DOB: 1941-02-03,  MRN: [id number]  Chief Complaint  Patient presents with  . Debridement    Trim toenails    78 y.o. female presents with the above complaint.  Reports painfully elongated nails to both feet.  Review of Systems: Negative except as noted in the HPI. Denies N/V/F/Ch.  Past Medical History:  Diagnosis Date  . Allergy   . Cancer (HCC)    cervical  . Hyperlipidemia   . Hypertension   . Obesity   . Osteopenia   . Vitamin D deficiency     Current Outpatient Medications:  .  amLODipine (NORVASC) 5 MG tablet, TAKE 1 TABLET(5 MG) BY MOUTH DAILY, Disp: 90 tablet, Rfl: 3 .  Calcium Carbonate-Vit D-Min (CALTRATE 600+D PLUS) 600-400 MG-UNIT per tablet, Chew 1 tablet by mouth daily.  , Disp: , Rfl:  .  Cetirizine HCl (ZYRTEC ALLERGY) 10 MG CAPS, Zyrtec 10 mg capsule  Take by oral route., Disp: , Rfl:  .  clotrimazole-betamethasone (LOTRISONE) cream, Apply 1 application topically 2 (two) times daily., Disp: 45 g, Rfl: prn .  diclofenac sodium (VOLTAREN) 1 % GEL, diclofenac 1 % topical gel  APP 2 GRAMS TOPICALLY AA TID., Disp: , Rfl:  .  econazole nitrate 1 % cream, Reported on 07/11/2016, Disp: , Rfl:  .  Garlic 144 MG TABS, Take 1,000 mg by mouth., Disp: , Rfl:  .  ketoconazole (NIZORAL) 2 % cream, ketoconazole 2 % topical cream, Disp: , Rfl:  .  loratadine (CLARITIN) 10 MG tablet, Take 1 tablet (10 mg total) by mouth daily., Disp: 30 tablet, Rfl: 11 .  MAXZIDE 75-50 MG tablet, TAKE 1 TABLET BY MOUTH DAILY, Disp: 90 tablet, Rfl: 3 .  Multiple Vitamin (MULTIVITAMIN) tablet, Take 1 tablet by mouth daily.  , Disp: , Rfl:  .  Omega-3 Fatty Acids (FISH OIL) 1000 MG CPDR, Take by mouth., Disp: , Rfl:  .  potassium chloride (K-DUR) 10 MEQ tablet, Take 1 tablet (10 mEq total) by mouth daily., Disp: 90 tablet, Rfl: 1 .  VITAMIN D, CHOLECALCIFEROL, PO, Take by mouth.  , Disp: , Rfl:   Social History   Tobacco Use  Smoking  Status Never Smoker  Smokeless Tobacco Never Used    Allergies  Allergen Reactions  . Penicillins Other (See Comments)    Chest pain  . Codeine   . Darvon    Objective:   Vitals:   04/01/19 1540  Temp: (!) 97.5 F (36.4 C)   There is no height or weight on file to calculate BMI. Constitutional Well developed. Well nourished.  Vascular Dorsalis pedis pulses palpable bilaterally. Posterior tibial pulses palpable bilaterally. Capillary refill normal to all digits.  No cyanosis or clubbing noted. Pedal hair growth normal.  Neurologic Normal speech. Oriented to person, place, and time. Epicritic sensation to light touch grossly present bilaterally.  Dermatologic Nails elongated dystrophic pain to palpation No open wounds. No skin lesions.  Orthopedic: Normal joint ROM without pain or crepitus bilaterally. No visible deformities. No bony tenderness.   Radiographs: None Assessment:   1. Pain due to onychomycosis of toenail    Plan:  Patient was evaluated and treated and all questions answered.  Onychomycosis with pain -Nails palliatively debridement as below -Educated on self-care  Procedure: Nail Debridement Rationale: Pain Type of Debridement: manual, sharp debridement. Instrumentation: Nail nipper, rotary burr. Number of Nails: 10    Return in about 2 months (around 06/01/2019) for  Routine Foot Care.

## 2019-05-11 ENCOUNTER — Other Ambulatory Visit: Payer: Self-pay

## 2019-05-11 ENCOUNTER — Other Ambulatory Visit: Payer: Medicare Other | Admitting: Internal Medicine

## 2019-05-11 DIAGNOSIS — E7800 Pure hypercholesterolemia, unspecified: Secondary | ICD-10-CM | POA: Diagnosis not present

## 2019-05-11 DIAGNOSIS — R7302 Impaired glucose tolerance (oral): Secondary | ICD-10-CM | POA: Diagnosis not present

## 2019-05-11 DIAGNOSIS — I10 Essential (primary) hypertension: Secondary | ICD-10-CM | POA: Diagnosis not present

## 2019-05-12 LAB — LIPID PANEL
Cholesterol: 229 mg/dL — ABNORMAL HIGH (ref <200)
HDL: 65 mg/dL (ref >=50)
LDL Cholesterol (Calc): 146 mg/dL (calc) — ABNORMAL HIGH
Non-HDL Cholesterol (Calc): 164 mg/dL (calc) — ABNORMAL HIGH (ref <130)
Total CHOL/HDL Ratio: 3.5 (calc) (ref <5.0)
Triglycerides: 74 mg/dL (ref <150)

## 2019-05-12 LAB — HEPATIC FUNCTION PANEL
AG Ratio: 1.3 (calc) (ref 1.0–2.5)
ALT: 13 U/L (ref 6–29)
AST: 18 U/L (ref 10–35)
Albumin: 4.3 g/dL (ref 3.6–5.1)
Alkaline phosphatase (APISO): 90 U/L (ref 37–153)
Bilirubin, Direct: 0.1 mg/dL (ref 0.0–0.2)
Globulin: 3.3 g/dL (calc) (ref 1.9–3.7)
Indirect Bilirubin: 0.5 mg/dL (calc) (ref 0.2–1.2)
Total Bilirubin: 0.6 mg/dL (ref 0.2–1.2)
Total Protein: 7.6 g/dL (ref 6.1–8.1)

## 2019-05-12 LAB — HEMOGLOBIN A1C
Hgb A1c MFr Bld: 5.6 % of total Hgb (ref <5.7)
Mean Plasma Glucose: 114 (calc)
eAG (mmol/L): 6.3 (calc)

## 2019-05-14 ENCOUNTER — Encounter: Payer: Self-pay | Admitting: Internal Medicine

## 2019-05-14 ENCOUNTER — Other Ambulatory Visit: Payer: Self-pay

## 2019-05-14 ENCOUNTER — Ambulatory Visit (INDEPENDENT_AMBULATORY_CARE_PROVIDER_SITE_OTHER): Payer: Medicare Other | Admitting: Internal Medicine

## 2019-05-14 VITALS — BP 150/70 | HR 111 | Ht 63.5 in | Wt 180.0 lb

## 2019-05-14 DIAGNOSIS — F411 Generalized anxiety disorder: Secondary | ICD-10-CM | POA: Diagnosis not present

## 2019-05-14 DIAGNOSIS — R7302 Impaired glucose tolerance (oral): Secondary | ICD-10-CM

## 2019-05-14 DIAGNOSIS — M1712 Unilateral primary osteoarthritis, left knee: Secondary | ICD-10-CM | POA: Diagnosis not present

## 2019-05-14 DIAGNOSIS — E8881 Metabolic syndrome: Secondary | ICD-10-CM

## 2019-05-14 DIAGNOSIS — E049 Nontoxic goiter, unspecified: Secondary | ICD-10-CM | POA: Diagnosis not present

## 2019-05-14 DIAGNOSIS — I10 Essential (primary) hypertension: Secondary | ICD-10-CM

## 2019-05-14 DIAGNOSIS — F439 Reaction to severe stress, unspecified: Secondary | ICD-10-CM | POA: Diagnosis not present

## 2019-05-14 DIAGNOSIS — E7800 Pure hypercholesterolemia, unspecified: Secondary | ICD-10-CM

## 2019-05-14 DIAGNOSIS — J309 Allergic rhinitis, unspecified: Secondary | ICD-10-CM | POA: Diagnosis not present

## 2019-05-14 MED ORDER — POTASSIUM CHLORIDE ER 10 MEQ PO TBCR: 10.0000 meq | EXTENDED_RELEASE_TABLET | Freq: Every day | ORAL | 1 refills | Status: DC

## 2019-05-14 MED ORDER — TRIAMTERENE-HCTZ 75-50 MG PO TABS: 1.0000 | ORAL_TABLET | Freq: Every day | ORAL | 1 refills | Status: DC

## 2019-05-14 MED ORDER — ALPRAZOLAM 0.5 MG PO TABS: ORAL_TABLET | ORAL | 1 refills | Status: DC

## 2019-05-14 MED ORDER — METHYLPREDNISOLONE ACETATE 80 MG/ML IJ SUSP
80.0000 mg | Freq: Once | INTRAMUSCULAR | Status: AC
  Administered 2019-05-14: 16:00:00 80 mg via INTRAMUSCULAR

## 2019-05-14 NOTE — Progress Notes (Signed)
   Subjective:    [name], female    DOB: 1941-01-15, 78 y.o.   MRN: [id number]  HPI 78 year old Female seen today in person for 71-month recheck.  She has a history of hyperlipidemia, glaucoma, hypertension and impaired glucose tolerance.  History of anxiety.  Husband is in the nursing home.  Sister lives here now but has her own apartment.  Hemoglobin A1c stable at 5.6%.  This is controlled by diet. Liver functions are normal.  Lipid panel shows total cholesterol 229 and an LDL cholesterol of 146.  HDL was 65 and triglycerides 74.  This is not much different than lipids noted in November 2019.  Even in 2018 that were similar results.  Discussed being on statin medication 3 times a week but patient declined.  She wants to work on diet and exercise.  Blood pressure is elevated at 150/70 with pulse of 111.  She is a bit anxious today.  She should monitor her blood pressure and let me know if it is running high consistently.   Review of Systems no new complaints     Objective:   Physical Exam  Neck supple.  No JVD thyromegaly or carotid bruits.  Chest clear to auscultation without rales or wheezing.  Skin warm and dry.  Vital signs reviewed.  Cardiac exam regular rate and rhythm normal S1 and S2 without murmurs or gallops.  Osteoarthritis changes in the knees.  No pitting edema.  Neuro no focal deficits on brief neurological exam.  She is alert and oriented x3.      Assessment & Plan:  Pure hypercholesterolemia-does not want to be on statin therapy  Essential hypertension-blood pressure elevated today in office but patient is anxious.  Is on Maxide 75/50 and amlodipine.  Osteoarthritis of knees  History of anxiety-Xanax feel that patient requires take sparingly  Obesity  History of Goiter treated by Dr. Dwyane Dee with thyroid suppression therapy  Dr. Ronita Hipps is GYN physician.  Plan: Follow-up in 6 months or as needed.  Recommend annual flu vaccine.  Continue  current medications.  Try to get more exercise and watch diet more carefully

## 2019-05-30 ENCOUNTER — Other Ambulatory Visit: Payer: Self-pay | Admitting: Internal Medicine

## 2019-05-30 MED ORDER — ALPRAZOLAM 0.5 MG PO TABS: ORAL_TABLET | ORAL | 1 refills | Status: DC

## 2019-05-30 NOTE — Patient Instructions (Signed)
Xanax refilled to take sparingly at patient request.  Monitor blood pressure and call if persistently elevated.  It is elevated today.  Continue to watch diet both for glucose and hyperlipidemia.  Needs to lose weight.  Try to get more exercise.  Follow-up in 6 months.  Patient does not want to be on statin medication.  Continue same antihypertensive medication.

## 2019-06-02 DIAGNOSIS — Z1231 Encounter for screening mammogram for malignant neoplasm of breast: Secondary | ICD-10-CM | POA: Diagnosis not present

## 2019-06-02 LAB — HM MAMMOGRAPHY

## 2019-06-03 ENCOUNTER — Encounter: Payer: Self-pay | Admitting: Internal Medicine

## 2019-06-18 ENCOUNTER — Other Ambulatory Visit: Payer: Self-pay

## 2019-06-18 ENCOUNTER — Ambulatory Visit (INDEPENDENT_AMBULATORY_CARE_PROVIDER_SITE_OTHER): Payer: Medicare Other | Admitting: Podiatry

## 2019-06-18 DIAGNOSIS — M79674 Pain in right toe(s): Secondary | ICD-10-CM | POA: Diagnosis not present

## 2019-06-18 DIAGNOSIS — B351 Tinea unguium: Secondary | ICD-10-CM

## 2019-06-18 DIAGNOSIS — M79675 Pain in left toe(s): Secondary | ICD-10-CM | POA: Diagnosis not present

## 2019-06-20 NOTE — Progress Notes (Signed)
Subjective:  [name], female    DOB: November 21, 1941,  MRN: [id number]  No chief complaint on file.   78 y.o. female presents with the above complaint.  Reports painfully elongated nails to both feet.  Review of Systems: Negative except as noted in the HPI. Denies N/V/F/Ch.  Past Medical History:  Diagnosis Date  . Allergy   . Cancer (HCC)    cervical  . Hyperlipidemia   . Hypertension   . Obesity   . Osteopenia   . Vitamin D deficiency     Current Outpatient Medications:  .  ALPRAZolam (XANAX) 0.5 MG tablet, One half to one  tablet po twice daily as needed for anxiety, Disp: 60 tablet, Rfl: 1 .  amLODipine (NORVASC) 5 MG tablet, TAKE 1 TABLET(5 MG) BY MOUTH DAILY, Disp: 90 tablet, Rfl: 3 .  Calcium Carbonate-Vit D-Min (CALTRATE 600+D PLUS) 600-400 MG-UNIT per tablet, Chew 1 tablet by mouth daily.  , Disp: , Rfl:  .  Cetirizine HCl (ZYRTEC ALLERGY) 10 MG CAPS, Zyrtec 10 mg capsule  Take by oral route., Disp: , Rfl:  .  clotrimazole-betamethasone (LOTRISONE) cream, Apply 1 application topically 2 (two) times daily., Disp: 45 g, Rfl: prn .  diclofenac sodium (VOLTAREN) 1 % GEL, diclofenac 1 % topical gel  APP 2 GRAMS TOPICALLY AA TID., Disp: , Rfl:  .  econazole nitrate 1 % cream, Reported on 07/11/2016, Disp: , Rfl:  .  Garlic 027 MG TABS, Take 1,000 mg by mouth., Disp: , Rfl:  .  ketoconazole (NIZORAL) 2 % cream, ketoconazole 2 % topical cream, Disp: , Rfl:  .  loratadine (CLARITIN) 10 MG tablet, Take 1 tablet (10 mg total) by mouth daily., Disp: 30 tablet, Rfl: 11 .  Multiple Vitamin (MULTIVITAMIN) tablet, Take 1 tablet by mouth daily.  , Disp: , Rfl:  .  Omega-3 Fatty Acids (FISH OIL) 1000 MG CPDR, Take by mouth., Disp: , Rfl:  .  potassium chloride (K-DUR) 10 MEQ tablet, Take 1 tablet (10 mEq total) by mouth daily., Disp: 90 tablet, Rfl: 1 .  triamterene-hydrochlorothiazide (MAXZIDE) 75-50 MG tablet, TAKE 1 TABLET BY MOUTH DAILY, Disp: 90 tablet, Rfl: 1 .   VITAMIN D, CHOLECALCIFEROL, PO, Take by mouth.  , Disp: , Rfl:   Social History   Tobacco Use  Smoking Status Never Smoker  Smokeless Tobacco Never Used    Allergies  Allergen Reactions  . Penicillins Other (See Comments)    Chest pain  . Codeine   . Darvon    Objective:  There were no vitals filed for this visit. There is no height or weight on file to calculate BMI. Constitutional Well developed. Well nourished.  Vascular Dorsalis pedis pulses palpable bilaterally. Posterior tibial pulses palpable bilaterally. Capillary refill normal to all digits.  No cyanosis or clubbing noted. Pedal hair growth normal.  Neurologic Normal speech. Oriented to person, place, and time. Epicritic sensation to light touch grossly present bilaterally.  Dermatologic Nails elongated dystrophic pain to palpation. Pincer nail deformities noted. No open wounds. No skin lesions.  Orthopedic: Normal joint ROM without pain or crepitus bilaterally. No visible deformities. No bony tenderness.   Radiographs: None Assessment:   1. Pain due to onychomycosis of toenails of both feet    Plan:  Patient was evaluated and treated and all questions answered.  Onychomycosis with pain -Nails palliatively debridement as below -Educated on self-care  Procedure: Nail Debridement Rationale: Pain Type of Debridement: manual, sharp debridement. Instrumentation: Nail nipper, rotary  burr. Number of Nails: 10    No follow-ups on file.

## 2019-06-25 DIAGNOSIS — H40033 Anatomical narrow angle, bilateral: Secondary | ICD-10-CM | POA: Diagnosis not present

## 2019-06-25 DIAGNOSIS — H40013 Open angle with borderline findings, low risk, bilateral: Secondary | ICD-10-CM | POA: Diagnosis not present

## 2019-08-12 DIAGNOSIS — Z01419 Encounter for gynecological examination (general) (routine) without abnormal findings: Secondary | ICD-10-CM | POA: Diagnosis not present

## 2019-08-12 DIAGNOSIS — Z779 Other contact with and (suspected) exposures hazardous to health: Secondary | ICD-10-CM | POA: Diagnosis not present

## 2019-08-20 ENCOUNTER — Other Ambulatory Visit: Payer: Self-pay

## 2019-08-20 ENCOUNTER — Ambulatory Visit (INDEPENDENT_AMBULATORY_CARE_PROVIDER_SITE_OTHER): Payer: Medicare Other | Admitting: Podiatry

## 2019-08-20 VITALS — Temp 97.5°F

## 2019-08-20 DIAGNOSIS — M79674 Pain in right toe(s): Secondary | ICD-10-CM

## 2019-08-20 DIAGNOSIS — B351 Tinea unguium: Secondary | ICD-10-CM

## 2019-08-20 DIAGNOSIS — M79675 Pain in left toe(s): Secondary | ICD-10-CM | POA: Diagnosis not present

## 2019-08-25 ENCOUNTER — Telehealth: Payer: Self-pay | Admitting: Internal Medicine

## 2019-08-25 NOTE — Telephone Encounter (Signed)
Set up virtual visit tomorrow or she can go to urgent care today

## 2019-08-25 NOTE — Telephone Encounter (Signed)
[place] [phone]  Sateria called to say for the last couple of days she has had nasal congestion, sinus drainage, last night she was dizzy when bending, pain in forehead around sinus area, puffy eyes, sneezing and itchy eyes especially after they mow in her neighborhood. Zyrtec is not helping, no fever, no headache and no COVID exposure,

## 2019-08-25 NOTE — Telephone Encounter (Signed)
Scheduled virtual visit °

## 2019-08-26 ENCOUNTER — Ambulatory Visit (INDEPENDENT_AMBULATORY_CARE_PROVIDER_SITE_OTHER): Payer: Medicare Other | Admitting: Internal Medicine

## 2019-08-26 ENCOUNTER — Other Ambulatory Visit: Payer: Self-pay

## 2019-08-26 ENCOUNTER — Encounter: Payer: Self-pay | Admitting: Internal Medicine

## 2019-08-26 VITALS — BP 140/90 | HR 88 | Temp 99.1°F | Ht 63.5 in | Wt 180.0 lb

## 2019-08-26 DIAGNOSIS — J302 Other seasonal allergic rhinitis: Secondary | ICD-10-CM | POA: Diagnosis not present

## 2019-08-26 DIAGNOSIS — J309 Allergic rhinitis, unspecified: Secondary | ICD-10-CM

## 2019-08-26 MED ORDER — METHYLPREDNISOLONE ACETATE 80 MG/ML IJ SUSP
80.0000 mg | Freq: Once | INTRAMUSCULAR | Status: AC
  Administered 2019-08-26: 80 mg via INTRAMUSCULAR

## 2019-08-26 NOTE — Patient Instructions (Signed)
Patient is to come to the office to receive Depo-Medrol IM.  She was seen virtually today and determined to have allergic rhinitis.  No known COVID-19 exposure.

## 2019-08-26 NOTE — Progress Notes (Signed)
   Subjective:    [name], female    DOB: 1941/12/21, 78 y.o.   MRN: [id number]  HPI 78 year old female seen by interactive audio and video telecommunications due to the coronavirus pandemic.  She is identified using 2 identifiers as Lisa [name], a longstanding patient in this practice.  She is agreeable to visit in this format today.  Patient complaining of congestion, itching and stuffy nose.  Symptoms began about 3 days ago.  Worse after grass was monitored at her complex yesterday.  History of allergic rhinitis.  Has received Depo-Medrol IM previously.  This seems to help her.  She is willing to take this injection today.  No known COVID-19 exposure.  No fever or shaking chills.  Feels certain that this is allergy related.  Received Depo-Medrol injection when seen for routine health maintenance exam and was complaining of allergic rhinitis symptoms November 09, 2018.    Review of Systems see above     Objective:   Physical Exam  Seen virtually.  Patient says she is afebrile.  Sounds a bit nasally congested.      Assessment & Plan:  Allergic rhinitis-seasonal  Plan: Patient will come to the office this afternoon for Depo-Medrol 80 mg IM.  She prefers this over a short course of oral prednisone.  20 minutes spent with patient, reviewing records and medical decision making.

## 2019-09-12 NOTE — Progress Notes (Signed)
Subjective:  [name], female    DOB: Jun 02, 1941,  MRN: [id number]  Chief Complaint  Patient presents with  . Nail Problem    Nail trim 1-5 bilateral    78 y.o. female presents with the above complaint.  Reports painfully elongated nails to both feet. Denies other concerns today.  Review of Systems: Negative except as noted in the HPI. Denies N/V/F/Ch.  Past Medical History:  Diagnosis Date  . Allergy   . Cancer (HCC)    cervical  . Hyperlipidemia   . Hypertension   . Obesity   . Osteopenia   . Vitamin D deficiency     Current Outpatient Medications:  .  ALPRAZolam (XANAX) 0.5 MG tablet, One half to one  tablet po twice daily as needed for anxiety, Disp: 60 tablet, Rfl: 1 .  amLODipine (NORVASC) 5 MG tablet, TAKE 1 TABLET(5 MG) BY MOUTH DAILY, Disp: 90 tablet, Rfl: 3 .  Calcium Carbonate-Vit D-Min (CALTRATE 600+D PLUS) 600-400 MG-UNIT per tablet, Chew 1 tablet by mouth daily.  , Disp: , Rfl:  .  Cetirizine HCl (ZYRTEC ALLERGY) 10 MG CAPS, Zyrtec 10 mg capsule  Take by oral route., Disp: , Rfl:  .  clotrimazole-betamethasone (LOTRISONE) cream, Apply 1 application topically 2 (two) times daily., Disp: 45 g, Rfl: prn .  diclofenac sodium (VOLTAREN) 1 % GEL, diclofenac 1 % topical gel  APP 2 GRAMS TOPICALLY AA TID., Disp: , Rfl:  .  econazole nitrate 1 % cream, Reported on 07/11/2016, Disp: , Rfl:  .  Garlic 123XX123 MG TABS, Take 1,000 mg by mouth., Disp: , Rfl:  .  ketoconazole (NIZORAL) 2 % cream, ketoconazole 2 % topical cream, Disp: , Rfl:  .  loratadine (CLARITIN) 10 MG tablet, Take 1 tablet (10 mg total) by mouth daily., Disp: 30 tablet, Rfl: 11 .  Multiple Vitamin (MULTIVITAMIN) tablet, Take 1 tablet by mouth daily.  , Disp: , Rfl:  .  Omega-3 Fatty Acids (FISH OIL) 1000 MG CPDR, Take by mouth., Disp: , Rfl:  .  potassium chloride (K-DUR) 10 MEQ tablet, Take 1 tablet (10 mEq total) by mouth daily., Disp: 90 tablet, Rfl: 1 .  triamterene-hydrochlorothiazide  (MAXZIDE) 75-50 MG tablet, TAKE 1 TABLET BY MOUTH DAILY, Disp: 90 tablet, Rfl: 1 .  VITAMIN D, CHOLECALCIFEROL, PO, Take by mouth.  , Disp: , Rfl:   Social History   Tobacco Use  Smoking Status Never Smoker  Smokeless Tobacco Never Used    Allergies  Allergen Reactions  . Penicillins Other (See Comments)    Chest pain  . Codeine   . Darvon    Objective:   Vitals:   08/20/19 1130  Temp: (!) 97.5 F (36.4 C)   There is no height or weight on file to calculate BMI. Constitutional Well developed. Well nourished.  Vascular Dorsalis pedis pulses palpable bilaterally. Posterior tibial pulses palpable bilaterally. Capillary refill normal to all digits.  No cyanosis or clubbing noted. Pedal hair growth normal.  Neurologic Normal speech. Oriented to person, place, and time. Epicritic sensation to light touch grossly present bilaterally.  Dermatologic Nails elongated dystrophic pain to palpation No open wounds. No skin lesions.  Orthopedic: Normal joint ROM without pain or crepitus bilaterally. Hammertoes bilat No bony tenderness.   Radiographs: None Assessment:   1. Pain due to onychomycosis of toenails of both feet    Plan:  Patient was evaluated and treated and all questions answered.  Onychomycosis with pain -Nails palliatively debridement as below -  Educated on self-care  Procedure: Nail Debridement Rationale: Pain Type of Debridement: manual, sharp debridement. Instrumentation: Nail nipper, rotary burr. Number of Nails: 10    Return if symptoms worsen or fail to improve.

## 2019-09-17 ENCOUNTER — Telehealth: Payer: Self-pay | Admitting: Internal Medicine

## 2019-09-17 NOTE — Telephone Encounter (Signed)
No relationship with allergies and flu vaccine

## 2019-09-17 NOTE — Telephone Encounter (Signed)
scheduled

## 2019-09-17 NOTE — Telephone Encounter (Signed)
Pt wants to schedule her flu shot but wanted to know if her allergies would make it a bad idea to get it right now

## 2019-09-20 ENCOUNTER — Other Ambulatory Visit: Payer: Self-pay

## 2019-09-20 ENCOUNTER — Ambulatory Visit (INDEPENDENT_AMBULATORY_CARE_PROVIDER_SITE_OTHER): Payer: Medicare Other | Admitting: Internal Medicine

## 2019-09-20 ENCOUNTER — Encounter: Payer: Self-pay | Admitting: Internal Medicine

## 2019-09-20 DIAGNOSIS — Z23 Encounter for immunization: Secondary | ICD-10-CM

## 2019-09-20 NOTE — Progress Notes (Signed)
Flu vaccine given by CMA 

## 2019-09-20 NOTE — Patient Instructions (Signed)
Patient received a flu vaccine IM L deltoid, AV, CMA  

## 2019-10-07 ENCOUNTER — Other Ambulatory Visit: Payer: BC Managed Care – PPO

## 2019-10-11 ENCOUNTER — Ambulatory Visit: Payer: BC Managed Care – PPO | Admitting: Endocrinology

## 2019-10-13 ENCOUNTER — Other Ambulatory Visit: Payer: Self-pay | Admitting: Endocrinology

## 2019-10-13 DIAGNOSIS — E042 Nontoxic multinodular goiter: Secondary | ICD-10-CM

## 2019-10-14 ENCOUNTER — Other Ambulatory Visit: Payer: Self-pay

## 2019-10-14 ENCOUNTER — Other Ambulatory Visit (INDEPENDENT_AMBULATORY_CARE_PROVIDER_SITE_OTHER): Payer: Medicare Other

## 2019-10-14 DIAGNOSIS — E042 Nontoxic multinodular goiter: Secondary | ICD-10-CM

## 2019-10-14 LAB — T4, FREE: Free T4: 0.94 ng/dL (ref 0.60–1.60)

## 2019-10-14 LAB — TSH: TSH: 0.9 u[IU]/mL (ref 0.35–4.50)

## 2019-10-18 ENCOUNTER — Encounter: Payer: Self-pay | Admitting: Endocrinology

## 2019-10-18 ENCOUNTER — Other Ambulatory Visit: Payer: Self-pay

## 2019-10-18 ENCOUNTER — Ambulatory Visit (INDEPENDENT_AMBULATORY_CARE_PROVIDER_SITE_OTHER): Payer: Medicare Other | Admitting: Endocrinology

## 2019-10-18 VITALS — BP 150/72 | HR 102 | Ht 63.5 in | Wt 178.6 lb

## 2019-10-18 DIAGNOSIS — E042 Nontoxic multinodular goiter: Secondary | ICD-10-CM

## 2019-10-18 NOTE — Progress Notes (Signed)
[name], female   DOB: 1941-06-17, 78 y.o.   MRN: [id number]    Reason for Appointment: Goiter, followup    History of Present Illness:   The patient's thyroid enlargement was first discovered in early 2012 when she was having a CT scan of her neck Subsequent ultrasound showed a 4.6 cm left-sided thyroid nodule. Aspiration biopsy of this showed non-neoplastic goiter  Because of the increased size of the left lobe nodule another needle aspiration was done again in 7/14 which was also benign.  Previously because of the tendency for her thyroid nodule to be gradually increasing she was told to try levothyroxine 75 mcg empirically to help with preventing further enlargement However follow-up TSH in 9/15 was significantly low and levothyroxine was stopped  Recent history:  In 2019 since she appeared to have a separate nodule palpable on the left side and ultrasound was done again This apparently did not show any significant change in the size of her nodules as reported by the radiologist  More recently has not had any symptoms of pressure in her neck or difficulty swallowing  Follow-up ultrasound in 2019 was reported as showing a large left lobe nodule only slightly increased in size but measurements were done in a different plane  Needle aspiration biopsy in 2014 showed nonneoplastic goiter  Her previous ultrasound in 04/2013 showed the following: The dominant nodule in the lower pole of the left lobe measures 4.6 x 3.7 x 4.9 cm  Previously, it measured 4.6 x 3.0 x 3.6 cm.  Right lobe nodules are not significantly changed. Right upper pole nodule is 13 x 9 x 9 mm. Right lower pole nodule is 9 x 6 x 8 mm   Lab Results  Component Value Date   FREET4 0.94 10/14/2019   FREET4 1.07 10/02/2018   FREET4 1.08 09/23/2017   TSH 0.90 10/14/2019   TSH 1.18 11/06/2018   TSH 0.82 10/02/2018      Allergies as of 10/18/2019      Reactions   Penicillins  Other (See Comments)   Chest pain   Codeine    Darvon       Medication List       Accurate as of October 18, 2019 11:59 PM. If you have any questions, ask your nurse or doctor.        ALPRAZolam 0.5 MG tablet Commonly known as: Xanax One half to one  tablet po twice daily as needed for anxiety   amLODipine 5 MG tablet Commonly known as: NORVASC TAKE 1 TABLET(5 MG) BY MOUTH DAILY   Caltrate 600+D Plus 600-400 MG-UNIT per tablet Chew 1 tablet by mouth daily.   clotrimazole-betamethasone cream Commonly known as: LOTRISONE Apply 1 application topically 2 (two) times daily.   diclofenac sodium 1 % Gel Commonly known as: VOLTAREN diclofenac 1 % topical gel  APP 2 GRAMS TOPICALLY AA TID.   econazole nitrate 1 % cream Reported on 07/11/2016   Fish Oil 1000 MG Cpdr Take by mouth.   Garlic 123XX123 MG Tabs Take 1,000 mg by mouth.   ketoconazole 2 % cream Commonly known as: NIZORAL ketoconazole 2 % topical cream   loratadine 10 MG tablet Commonly known as: CLARITIN Take 1 tablet (10 mg total) by mouth daily.   multivitamin tablet Take 1 tablet by mouth daily.   potassium chloride 10 MEQ tablet Commonly known as: KLOR-CON Take 1 tablet (10 mEq total) by mouth daily.   triamterene-hydrochlorothiazide 75-50 MG tablet  Commonly known as: MAXZIDE TAKE 1 TABLET BY MOUTH DAILY   VITAMIN D (CHOLECALCIFEROL) PO Take by mouth.   ZyrTEC Allergy 10 MG Caps Generic drug: Cetirizine HCl Zyrtec 10 mg capsule  Take by oral route.       Allergies:  Allergies  Allergen Reactions  . Penicillins Other (See Comments)    Chest pain  . Codeine   . Darvon     Past Medical History:  Diagnosis Date  . Allergy   . Cancer (HCC)    cervical  . Hyperlipidemia   . Hypertension   . Obesity   . Osteopenia   . Vitamin D deficiency     Past Surgical History:  Procedure Laterality Date  . ABDOMINAL HYSTERECTOMY    . EYE SURGERY    . HAMMER TOE SURGERY  12/02   right     Family History  Problem Relation Age of Onset  . Hypertension Mother   . Heart disease Father   . Hypertension Father   . Thyroid disease Father     Social History:  reports that she has never smoked. She has never used smokeless tobacco. She reports that she does not drink alcohol or use drugs.   Review of Systems:  Hypertension followed by PCP .                Examination:   BP (!) 150/72 (BP Location: Left Arm, Patient Position: Sitting, Cuff Size: Normal)   Pulse (!) 102   Ht 5' 3.5" (1.613 m)   Wt 178 lb 9.6 oz (81 kg)   SpO2 97%   BMI 31.14 kg/m                 Thyroid exam: Left lobe is only palpable medially on swallowing She has a smooth firm nodule, difficult to estimate the size but only about 3 cm in size by palpation  Right lobe is about one half times normal, slightly firm and nodular   There is no lymphadenopathy in the neck .       Assessment/Plan:  Multinodular goiter with dominant left lower pole nodule which has been proven benign by biopsy twice Although the nodule on the left appears to be relatively smaller on exam it is likely more posterior in location  Since she has had benign results on needle aspiration biopsies and has no new local pressure symptoms will continue to watch clinically   She will followup in 1 year for evaluation   Elayne Snare 10/19/2019

## 2019-10-29 ENCOUNTER — Ambulatory Visit (INDEPENDENT_AMBULATORY_CARE_PROVIDER_SITE_OTHER): Payer: Medicare Other | Admitting: Podiatry

## 2019-10-29 ENCOUNTER — Other Ambulatory Visit: Payer: Self-pay

## 2019-10-29 DIAGNOSIS — B351 Tinea unguium: Secondary | ICD-10-CM | POA: Diagnosis not present

## 2019-10-29 DIAGNOSIS — M79674 Pain in right toe(s): Secondary | ICD-10-CM

## 2019-10-29 DIAGNOSIS — M79675 Pain in left toe(s): Secondary | ICD-10-CM

## 2019-10-29 NOTE — Progress Notes (Signed)
Subjective:  [name], female    DOB: 1941/05/02,  MRN: [id number]  Chief Complaint  Patient presents with  . Nail Problem    Nail trim 1-5 bilateral   78 y.o. female presents with the above complaint.  Reports painfully thickened nails to both feet.  Review of Systems: Negative except as noted in the HPI. Denies N/V/F/Ch.  Past Medical History:  Diagnosis Date  . Allergy   . Cancer (HCC)    cervical  . Hyperlipidemia   . Hypertension   . Obesity   . Osteopenia   . Vitamin D deficiency     Current Outpatient Medications:  .  ALPRAZolam (XANAX) 0.5 MG tablet, One half to one  tablet po twice daily as needed for anxiety, Disp: 60 tablet, Rfl: 1 .  amLODipine (NORVASC) 5 MG tablet, TAKE 1 TABLET(5 MG) BY MOUTH DAILY, Disp: 90 tablet, Rfl: 3 .  Calcium Carbonate-Vit D-Min (CALTRATE 600+D PLUS) 600-400 MG-UNIT per tablet, Chew 1 tablet by mouth daily.  , Disp: , Rfl:  .  Cetirizine HCl (ZYRTEC ALLERGY) 10 MG CAPS, Zyrtec 10 mg capsule  Take by oral route., Disp: , Rfl:  .  clotrimazole-betamethasone (LOTRISONE) cream, Apply 1 application topically 2 (two) times daily., Disp: 45 g, Rfl: prn .  diclofenac sodium (VOLTAREN) 1 % GEL, diclofenac 1 % topical gel  APP 2 GRAMS TOPICALLY AA TID., Disp: , Rfl:  .  econazole nitrate 1 % cream, Reported on 07/11/2016, Disp: , Rfl:  .  Garlic 123XX123 MG TABS, Take 1,000 mg by mouth., Disp: , Rfl:  .  ketoconazole (NIZORAL) 2 % cream, ketoconazole 2 % topical cream, Disp: , Rfl:  .  loratadine (CLARITIN) 10 MG tablet, Take 1 tablet (10 mg total) by mouth daily., Disp: 30 tablet, Rfl: 11 .  Multiple Vitamin (MULTIVITAMIN) tablet, Take 1 tablet by mouth daily.  , Disp: , Rfl:  .  Omega-3 Fatty Acids (FISH OIL) 1000 MG CPDR, Take by mouth., Disp: , Rfl:  .  potassium chloride (K-DUR) 10 MEQ tablet, Take 1 tablet (10 mEq total) by mouth daily., Disp: 90 tablet, Rfl: 1 .  triamterene-hydrochlorothiazide (MAXZIDE) 75-50 MG tablet, TAKE  1 TABLET BY MOUTH DAILY, Disp: 90 tablet, Rfl: 1 .  VITAMIN D, CHOLECALCIFEROL, PO, Take by mouth.  , Disp: , Rfl:   Social History   Tobacco Use  Smoking Status Never Smoker  Smokeless Tobacco Never Used    Allergies  Allergen Reactions  . Penicillins Other (See Comments)    Chest pain  . Codeine   . Darvon    Objective:  There were no vitals filed for this visit. There is no height or weight on file to calculate BMI. Constitutional Well developed. Well nourished.  Vascular Dorsalis pedis pulses palpable bilaterally. Posterior tibial pulses palpable bilaterally. Capillary refill normal to all digits.  No cyanosis or clubbing noted. Pedal hair growth normal.  Neurologic Normal speech. Oriented to person, place, and time. Epicritic sensation to light touch grossly present bilaterally.  Dermatologic Nails elongated dystrophic pain to palpation No open wounds. No skin lesions.  Orthopedic: Normal joint ROM without pain or crepitus bilaterally. No visible deformities. No bony tenderness.   Radiographs: None Assessment:   1. Pain due to onychomycosis of toenails of both feet    Plan:  Patient was evaluated and treated and all questions answered.  Onychomycosis with pain -Nails palliatively debridement as below -Educated on self-care  Procedure: Nail Debridement Rationale: Pain Type of  Debridement: manual, sharp debridement. Instrumentation: Nail nipper, rotary burr. Number of Nails: 10  No follow-ups on file.

## 2019-11-08 ENCOUNTER — Other Ambulatory Visit: Payer: Medicare Other | Admitting: Internal Medicine

## 2019-11-09 ENCOUNTER — Other Ambulatory Visit: Payer: Self-pay

## 2019-11-09 ENCOUNTER — Other Ambulatory Visit: Payer: Medicare Other | Admitting: Internal Medicine

## 2019-11-09 DIAGNOSIS — E8881 Metabolic syndrome: Secondary | ICD-10-CM

## 2019-11-09 DIAGNOSIS — I10 Essential (primary) hypertension: Secondary | ICD-10-CM | POA: Diagnosis not present

## 2019-11-09 DIAGNOSIS — Z1329 Encounter for screening for other suspected endocrine disorder: Secondary | ICD-10-CM

## 2019-11-09 DIAGNOSIS — Z0000 Encounter for general adult medical examination without abnormal findings: Secondary | ICD-10-CM

## 2019-11-09 DIAGNOSIS — M1712 Unilateral primary osteoarthritis, left knee: Secondary | ICD-10-CM

## 2019-11-09 DIAGNOSIS — F439 Reaction to severe stress, unspecified: Secondary | ICD-10-CM | POA: Diagnosis not present

## 2019-11-09 DIAGNOSIS — R7302 Impaired glucose tolerance (oral): Secondary | ICD-10-CM

## 2019-11-09 DIAGNOSIS — E7800 Pure hypercholesterolemia, unspecified: Secondary | ICD-10-CM

## 2019-11-10 LAB — CBC WITH DIFFERENTIAL/PLATELET
Absolute Monocytes: 384 cells/uL (ref 200–950)
Basophils Absolute: 18 cells/uL (ref 0–200)
Basophils Relative: 0.3 %
Eosinophils Absolute: 43 cells/uL (ref 15–500)
Eosinophils Relative: 0.7 %
HCT: 39.7 % (ref 35.0–45.0)
Hemoglobin: 12.9 g/dL (ref 11.7–15.5)
Lymphs Abs: 1385 cells/uL (ref 850–3900)
MCH: 25.5 pg — ABNORMAL LOW (ref 27.0–33.0)
MCHC: 32.5 g/dL (ref 32.0–36.0)
MCV: 78.6 fL — ABNORMAL LOW (ref 80.0–100.0)
MPV: 9.9 fL (ref 7.5–12.5)
Monocytes Relative: 6.3 %
Neutro Abs: 4270 cells/uL (ref 1500–7800)
Neutrophils Relative %: 70 %
Platelets: 282 10*3/uL (ref 140–400)
RBC: 5.05 10*6/uL (ref 3.80–5.10)
RDW: 15.1 % — ABNORMAL HIGH (ref 11.0–15.0)
Total Lymphocyte: 22.7 %
WBC: 6.1 10*3/uL (ref 3.8–10.8)

## 2019-11-10 LAB — HEMOGLOBIN A1C
Hgb A1c MFr Bld: 5.6 % of total Hgb (ref <5.7)
Mean Plasma Glucose: 114 (calc)
eAG (mmol/L): 6.3 (calc)

## 2019-11-10 LAB — COMPLETE METABOLIC PANEL WITH GFR
AG Ratio: 1.5 (calc) (ref 1.0–2.5)
ALT: 12 U/L (ref 6–29)
AST: 16 U/L (ref 10–35)
Albumin: 4.5 g/dL (ref 3.6–5.1)
Alkaline phosphatase (APISO): 86 U/L (ref 37–153)
BUN: 25 mg/dL (ref 7–25)
CO2: 25 mmol/L (ref 20–32)
Calcium: 9.8 mg/dL (ref 8.6–10.4)
Chloride: 101 mmol/L (ref 98–110)
Creat: 0.87 mg/dL (ref 0.60–0.93)
GFR, Est African American: 76 mL/min/{1.73_m2} (ref >=60)
GFR, Est Non African American: 65 mL/min/{1.73_m2} (ref >=60)
Globulin: 3.1 g/dL (calc) (ref 1.9–3.7)
Glucose, Bld: 120 mg/dL — ABNORMAL HIGH (ref 65–99)
Potassium: 3.5 mmol/L (ref 3.5–5.3)
Sodium: 139 mmol/L (ref 135–146)
Total Bilirubin: 0.7 mg/dL (ref 0.2–1.2)
Total Protein: 7.6 g/dL (ref 6.1–8.1)

## 2019-11-10 LAB — MICROALBUMIN / CREATININE URINE RATIO
Creatinine, Urine: 51 mg/dL (ref 20–275)
Microalb Creat Ratio: 8 mcg/mg creat (ref <30)
Microalb, Ur: 0.4 mg/dL

## 2019-11-10 LAB — LIPID PANEL
Cholesterol: 238 mg/dL — ABNORMAL HIGH (ref <200)
HDL: 68 mg/dL (ref >=50)
LDL Cholesterol (Calc): 155 mg/dL (calc) — ABNORMAL HIGH
Non-HDL Cholesterol (Calc): 170 mg/dL (calc) — ABNORMAL HIGH (ref <130)
Total CHOL/HDL Ratio: 3.5 (calc) (ref <5.0)
Triglycerides: 59 mg/dL (ref <150)

## 2019-11-10 LAB — TSH: TSH: 1.13 mIU/L (ref 0.40–4.50)

## 2019-11-16 ENCOUNTER — Other Ambulatory Visit: Payer: Self-pay

## 2019-11-16 ENCOUNTER — Ambulatory Visit (INDEPENDENT_AMBULATORY_CARE_PROVIDER_SITE_OTHER): Payer: Medicare Other | Admitting: Internal Medicine

## 2019-11-16 ENCOUNTER — Encounter: Payer: Self-pay | Admitting: Internal Medicine

## 2019-11-16 VITALS — BP 130/60 | HR 113 | Temp 98.3°F | Ht 63.5 in | Wt 176.0 lb

## 2019-11-16 DIAGNOSIS — J302 Other seasonal allergic rhinitis: Secondary | ICD-10-CM | POA: Diagnosis not present

## 2019-11-16 DIAGNOSIS — J309 Allergic rhinitis, unspecified: Secondary | ICD-10-CM

## 2019-11-16 DIAGNOSIS — I10 Essential (primary) hypertension: Secondary | ICD-10-CM

## 2019-11-16 DIAGNOSIS — Z8541 Personal history of malignant neoplasm of cervix uteri: Secondary | ICD-10-CM

## 2019-11-16 DIAGNOSIS — Z0000 Encounter for general adult medical examination without abnormal findings: Secondary | ICD-10-CM

## 2019-11-16 DIAGNOSIS — F411 Generalized anxiety disorder: Secondary | ICD-10-CM | POA: Diagnosis not present

## 2019-11-16 DIAGNOSIS — E8881 Metabolic syndrome: Secondary | ICD-10-CM

## 2019-11-16 DIAGNOSIS — E7800 Pure hypercholesterolemia, unspecified: Secondary | ICD-10-CM | POA: Diagnosis not present

## 2019-11-16 DIAGNOSIS — R7302 Impaired glucose tolerance (oral): Secondary | ICD-10-CM

## 2019-11-16 DIAGNOSIS — E042 Nontoxic multinodular goiter: Secondary | ICD-10-CM

## 2019-11-16 DIAGNOSIS — M1712 Unilateral primary osteoarthritis, left knee: Secondary | ICD-10-CM

## 2019-11-16 LAB — POCT URINALYSIS DIPSTICK
Appearance: NEGATIVE
Bilirubin, UA: NEGATIVE
Blood, UA: NEGATIVE
Glucose, UA: NEGATIVE
Ketones, UA: NEGATIVE
Leukocytes, UA: NEGATIVE
Nitrite, UA: NEGATIVE
Odor: NEGATIVE
Protein, UA: NEGATIVE
Spec Grav, UA: 1.015 (ref 1.010–1.025)
Urobilinogen, UA: 0.2 E.U./dL
pH, UA: 6 (ref 5.0–8.0)

## 2019-11-16 NOTE — Progress Notes (Signed)
Subjective:    [name], female    DOB: 11-Jan-1941, 78 y.o.   MRN: [id number]  HPI 78 year old Female for [employer], health maintenance exam and evaluation of medical issues.  History of multinodular goiter followed by Dr. Dwyane Dee  Has elevated LDL - does not want to be even once a week statin.  She has a history of essential hypertension, history of glaucoma, impaired glucose tolerance, allergic rhinitis, osteoarthritis of knees.  History of benign colon polyp.  Ultrasound-guided needle biopsy aspirate of thyroid gland in 2012 was benign.  Had large colon polyp removed at [facility] in [place] that was benign a number of years ago.  She is on thyroid suppression by Dr. Dwyane Dee for goiter.  History of hemorrhoids and rectal itching for which she uses clotrimazole.  Has been on Maxide 75/50 since 1999 for hypertension.  In 2010 amlodipine was added for improved control of blood pressure.  History of anxiety and depression.  Husband is chronically ill in nursing home.  However she says he is doing fairly well.  Hysterectomy without oophorectomy for cervical cancer in 1977 in [place].  Dr. Ronita Hipps is GYN physician currently.  Right hammertoe surgery 2002  History of vitamin D deficiency  Is intolerant of penicillin it causes chest pain.  Intolerant of codeine.  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  No children.  Husband is a retired Firefighter.  Sister is now living here in [place].  Family history: Mother deceased with history of hypertension and dementia.  She had kidney failure.  Sister lives here apparently is healthy.  Father deceased with history of diabetes and hypertension.  Review of Systems  Respiratory: Negative.   Cardiovascular: Negative.   Gastrointestinal: Negative.   Musculoskeletal:       Chronic left knee pain  Neurological: Negative.         Objective:   Physical Exam Blood pressure 130/60 pulse 113-patient is slightly anxious temperature 98.3 degrees orally weight 176 pounds BMI 30.69  Skin warm and dry.  Nodes none.  TMs are clear.  Thyroid symmetrical and slightly enlarged.  Chest clear to auscultation.  Breasts normal female.  Cardiac exam regular rate and rhythm.  Abdomen no hepatosplenomegaly masses or tenderness.  Extremities without edema.  Has mild effusion left knee consistent with primary osteoarthritis  Thought judgment and affect are normal.  No focal deficits on brief neurological exam       Assessment & Plan:  Essential hypertension-stable on current regimen  Hyperlipidemia-does not want to be on statin medication continue to monitor  Osteoarthritis of left knee which is longstanding and does not want to do knee arthroplasty at the present time  History of anxiety treated with benzodiazepine medication  Remote history of cervical cancer-has GYN physician Dr. Ronita Hipps  Allergic rhinitis-gets Depo-Medrol 80 mg IM about every 6 months which seems to help symptoms.  History of impaired glucose tolerance-treated with diet  Goiter on thyroid suppression medication followed by Dr. Dwyane Dee  Plan: Continue current medications and follow-up in 6 months.  Subjective:   Patient presents for Medicare Annual/Subsequent preventive examination.  Review Past Medical/Family/Social:   Risk Factors  Current exercise habits:  Dietary issues discussed:   Cardiac risk factors:  Depression Screen  (Note: if answer to either of the following is "Yes", a more complete depression screening is indicated)   Over the past two  weeks, have you felt down, depressed or hopeless? No  Over the past two weeks, have you felt little interest or pleasure in doing things? No Have you lost interest or pleasure in daily life? No Do you often feel hopeless? No Do you cry easily over simple problems? No   Activities of Daily  Living  In your present state of health, do you have any difficulty performing the following activities?:   Driving? No  Managing money? No  Feeding yourself? No  Getting from bed to chair? No  Climbing a flight of stairs? No  Preparing food and eating?: No  Bathing or showering? No  Getting dressed: No  Getting to the toilet? No  Using the toilet:No  Moving around from place to place: No  In the past year have you fallen or had a near fall?:No  Are you sexually active? No  Do you have more than one partner? No   Hearing Difficulties: No  Do you often ask people to speak up or repeat themselves? No  Do you experience ringing or noises in your ears? No  Do you have difficulty understanding soft or whispered voices? No  Do you feel that you have a problem with memory? No Do you often misplace items? No    Home Safety:  Do you have a smoke alarm at your residence? Yes Do you have grab bars in the bathroom? Do you have throw rugs in your house?   Cognitive Testing  Alert? Yes Normal Appearance?Yes  Oriented to person? Yes Place? Yes  Time? Yes  Recall of three objects? Yes  Can perform simple calculations? Yes  Displays appropriate judgment?Yes  Can read the correct time from a watch face?Yes   List the Names of Other Physician/Practitioners you currently use:  See referral list for the physicians patient is currently seeing.     Review of Systems:   Objective:     General appearance: Appears stated age and mildly obese  Head: Normocephalic, without obvious abnormality, atraumatic  Eyes: conj clear, EOMi PEERLA  Ears: normal TM's and external ear canals both ears  Nose: Nares normal. Septum midline. Mucosa normal. No drainage or sinus tenderness.  Throat: lips, mucosa, and tongue normal; teeth and gums normal  Neck: no adenopathy, no carotid bruit, no JVD, supple, symmetrical, trachea midline and thyroid not enlarged, symmetric, no tenderness/mass/nodules  No  CVA tenderness.  Lungs: clear to auscultation bilaterally  Breasts: normal appearance, no masses or tenderness Heart: regular rate and rhythm, S1, S2 normal, no murmur, click, rub or gallop  Abdomen: soft, non-tender; bowel sounds normal; no masses, no organomegaly  Musculoskeletal: ROM normal in all joints, no crepitus, no deformity, Normal muscle strengthen. Back  is symmetric, no curvature. Skin: Skin color, texture, turgor normal. No rashes or lesions  Lymph nodes: Cervical, supraclavicular, and axillary nodes normal.  Neurologic: CN 2 -12 Normal, Normal symmetric reflexes. Normal coordination and gait  Psych: Alert & Oriented x 3, Mood appear stable.    Assessment:    Annual wellness medicare exam   Plan:    During the course of the visit the patient was educated and counseled about appropriate screening and preventive services including:   Annual flu vaccine which she received in September.  Recommended for shingles vaccine until pandemic is over.  Tdap due in 2025.  Pneumococcal vaccines are up-to-date.     Patient Instructions (the written plan) was given to the patient.  Medicare Attestation  I have personally reviewed:  The patient's medical and social history  Their use of alcohol, tobacco or illicit drugs  Their current medications and supplements  The patient's functional ability including ADLs,fall risks, home safety risks, cognitive, and hearing and visual impairment  Diet and physical activities  Evidence for depression or mood disorders  The patient's weight, height, BMI, and visual acuity have been recorded in the chart. I have made referrals, counseling, and provided education to the patient based on review of the above and I have provided the patient with a written personalized care plan for preventive services.

## 2019-11-18 ENCOUNTER — Ambulatory Visit: Payer: Medicare Other | Admitting: Podiatry

## 2019-11-29 ENCOUNTER — Telehealth: Payer: Self-pay | Admitting: Internal Medicine

## 2019-11-29 MED ORDER — LORAZEPAM 1 MG PO TABS: 1.0000 mg | ORAL_TABLET | Freq: Two times a day (BID) | ORAL | 0 refills | Status: DC | PRN

## 2019-11-29 NOTE — Telephone Encounter (Signed)
Rico [place] [phone]  Sura called to say that her husband passed away on 03-30-23 from [place] and she wanted to see if she could get something to get her thru the next week. She has taken Ativan in the past. I let her know she may need virtual visit to discuss.

## 2019-11-29 NOTE — Telephone Encounter (Signed)
Please call in Ativan 1 mg #60 tabs one po twice daily as needed for anxiety

## 2019-12-08 ENCOUNTER — Other Ambulatory Visit: Payer: Self-pay | Admitting: Internal Medicine

## 2019-12-08 NOTE — Telephone Encounter (Signed)
Ativan was prescribed when husband died. Fill this for 30 days only for now.

## 2019-12-08 NOTE — Telephone Encounter (Signed)
Received Fax RX request from  [facility], [place] [phone] (Phone) [phone] (Fax)     Medication - triamterene-hydrochlorothiazide (MAXZIDE) 75-50 MG tablet Last Refill - 09/03/19  ALPRAZolam (XANAX) 0.5 MG tablet Last Refill - 05/14/19  Last OV - 11/16/19  Last CPE - 11/16/19  Next Appointment - 05/16/2020

## 2019-12-09 MED ORDER — ALPRAZOLAM 0.5 MG PO TABS: 0.5000 mg | ORAL_TABLET | Freq: Every evening | ORAL | 0 refills | Status: DC | PRN

## 2019-12-09 MED ORDER — TRIAMTERENE-HCTZ 75-50 MG PO TABS: 1.0000 | ORAL_TABLET | Freq: Every day | ORAL | 1 refills | Status: DC

## 2019-12-29 NOTE — Patient Instructions (Signed)
It was a pleasure to see you today.  Continue current medications and follow-up in 6 months. 

## 2020-01-06 ENCOUNTER — Ambulatory Visit (INDEPENDENT_AMBULATORY_CARE_PROVIDER_SITE_OTHER): Payer: Medicare Other | Admitting: Podiatry

## 2020-01-06 ENCOUNTER — Other Ambulatory Visit: Payer: Self-pay

## 2020-01-06 DIAGNOSIS — I872 Venous insufficiency (chronic) (peripheral): Secondary | ICD-10-CM | POA: Diagnosis not present

## 2020-01-06 DIAGNOSIS — M79674 Pain in right toe(s): Secondary | ICD-10-CM | POA: Diagnosis not present

## 2020-01-06 DIAGNOSIS — B351 Tinea unguium: Secondary | ICD-10-CM | POA: Diagnosis not present

## 2020-01-06 DIAGNOSIS — M79675 Pain in left toe(s): Secondary | ICD-10-CM

## 2020-01-06 NOTE — Progress Notes (Signed)
  Subjective:  [name], female    DOB: Nov 23, 1941,  MRN: [id number]  Chief Complaint  Patient presents with  . Nail Problem    pt is here to get a toenail trim, as well as a callus trim    79 y.o. female presents with the above complaint. History confirmed with patient.  Also complains of swelling to the right leg.  Recently having pain in the right knee from arthritis.  Objective:  Physical Exam: warm, good capillary refill, nail exam onychomycosis of the toenails with pain to palpation, no trophic changes or ulcerative lesions, normal DP and PT pulses, and normal sensory exam.  No images are attached to the encounter.  Assessment:   1. Pain due to onychomycosis of toenails of both feet   2. Venous (peripheral) insufficiency      Plan:  Patient was evaluated and treated and all questions answered.  Onychomycosis -Nails palliatively debrided secondary to pain   Procedure: Nail Debridement Rationale: Patient meets criteria for nail care due to pain Type of Debridement: manual, sharp debridement. Instrumentation: Nail nipper, rotary burr. Number of Nails: 10  Venous insufficiency, likely secondary to arthritis of right knee -Tubigrip dispensed  No follow-ups on file.   MDM

## 2020-01-07 ENCOUNTER — Ambulatory Visit: Payer: Medicare Other | Admitting: Podiatry

## 2020-01-25 ENCOUNTER — Ambulatory Visit: Payer: Medicare Other

## 2020-02-04 ENCOUNTER — Other Ambulatory Visit: Payer: Self-pay

## 2020-02-04 MED ORDER — AMLODIPINE BESYLATE 5 MG PO TABS: ORAL_TABLET | ORAL | 3 refills | Status: DC

## 2020-02-11 ENCOUNTER — Ambulatory Visit: Payer: Medicare Other

## 2020-03-09 ENCOUNTER — Other Ambulatory Visit: Payer: Self-pay

## 2020-03-09 ENCOUNTER — Ambulatory Visit (INDEPENDENT_AMBULATORY_CARE_PROVIDER_SITE_OTHER): Payer: Medicare Other | Admitting: Podiatry

## 2020-03-09 VITALS — Temp 98.4°F

## 2020-03-09 DIAGNOSIS — M79675 Pain in left toe(s): Secondary | ICD-10-CM

## 2020-03-09 DIAGNOSIS — I872 Venous insufficiency (chronic) (peripheral): Secondary | ICD-10-CM | POA: Diagnosis not present

## 2020-03-09 DIAGNOSIS — B351 Tinea unguium: Secondary | ICD-10-CM

## 2020-03-09 DIAGNOSIS — M79674 Pain in right toe(s): Secondary | ICD-10-CM

## 2020-03-09 NOTE — Progress Notes (Signed)
  Subjective:  [name], female    DOB: 1941/06/14,  MRN: [id number]  Chief Complaint  Patient presents with  . Nail Problem    Thick, long toenails. Requests nail trim.    79 y.o. female presents with the above complaint. History confirmed with patient.  Nails are painful and hard to cut. Reports continued Right leg swelling.  Objective:  Physical Exam: warm, good capillary refill, nail exam onychomycosis of the toenails with pain to palpation, no trophic changes or ulcerative lesions, normal DP and PT pulses, and normal sensory exam. Pitting edema right leg No images are attached to the encounter.  Assessment:   1. Pain due to onychomycosis of toenails of both feet   2. Venous (peripheral) insufficiency      Plan:  Patient was evaluated and treated and all questions answered.  Onychomycosis -Nails palliatively debrided secondary to pain   Procedure: Nail Debridement Rationale: Patient meets criteria for nail care due to pain Type of Debridement: manual, sharp debridement. Instrumentation: Nail nipper, rotary burr. Number of Nails: 10  Venous insufficiency, likely secondary to arthritis of right knee -Tubigrip dispensed again.  No follow-ups on file.

## 2020-03-13 ENCOUNTER — Ambulatory Visit (INDEPENDENT_AMBULATORY_CARE_PROVIDER_SITE_OTHER): Payer: Medicare Other | Admitting: Internal Medicine

## 2020-03-13 ENCOUNTER — Encounter: Payer: Self-pay | Admitting: Internal Medicine

## 2020-03-13 ENCOUNTER — Other Ambulatory Visit: Payer: Self-pay

## 2020-03-13 VITALS — BP 130/70 | HR 114 | Temp 98.0°F | Ht 63.5 in | Wt 176.0 lb

## 2020-03-13 DIAGNOSIS — I10 Essential (primary) hypertension: Secondary | ICD-10-CM | POA: Diagnosis not present

## 2020-03-13 DIAGNOSIS — F4321 Adjustment disorder with depressed mood: Secondary | ICD-10-CM

## 2020-03-13 DIAGNOSIS — M1711 Unilateral primary osteoarthritis, right knee: Secondary | ICD-10-CM

## 2020-03-13 DIAGNOSIS — F411 Generalized anxiety disorder: Secondary | ICD-10-CM

## 2020-03-16 MED ORDER — TRIAMTERENE-HCTZ 75-50 MG PO TABS: 1.0000 | ORAL_TABLET | Freq: Every day | ORAL | 1 refills | Status: DC

## 2020-03-20 ENCOUNTER — Ambulatory Visit: Payer: Medicare Other | Admitting: Internal Medicine

## 2020-03-21 ENCOUNTER — Telehealth: Payer: Self-pay | Admitting: Internal Medicine

## 2020-03-21 NOTE — Telephone Encounter (Signed)
Received Fax RX request from  [facility] [place] [address]   Fax [phone]  Medication -  MAXZIDE 75-50 MG tablet    Last Refill - 12/15/2019  Last OV - 11/16/19  Last CPE - 11/16/19  Next Appointment - 05/16/20

## 2020-03-22 NOTE — Progress Notes (Signed)
   Subjective:    [name], female    DOB: [birth year], 79 y.o.   MRN: [id number]  HPI 79 year old Female seen for follow-up.  Husband died of complications of [id number] in nursing home where he had resided for several years.  A lot of stress surrounding his family and their wishes.  Patient seems to be doing better but  is somewhat tearful in the office.  Long discussion surrounding husband's diagnosis of COVID-19 in the nursing facility.  She regrets not being able to visit him due to COVID-19 restrictions.  His family has been difficult to deal with during the funeral process and subsequently.  He had children from a previous marriage.  Review of Systems having issues with right knee pain and will be seeing orthopedist soon     Objective:   Physical Exam Blood pressure 130/70 pulse 114-patient is anxious pulse oximetry 94%.  She is afebrile.  Neck supple.  Chest clear.  Cardiac exam regular rate and rhythm.  Right knee is swollen.  Defer further exam to orthopedist.     Assessment & Plan:  Grief reaction-due to loss of husband  Essential hypertension-stable on current regimen  Anxiety state-surrounding death of her husband and family issues  Right knee osteoarthritis-to see orthopedist in the near future  Plan: Patient has follow-up appointment here in May for 35-month recheck and fasting labs.  30 minutes spent with patient today most of which was spent with her relaying to me events surrounding her husband's passing and family issues.  Condolences offered.  Blood pressure is stable on current regimen.  Patient has antianxiety medication on hand and should take it as needed.

## 2020-03-22 NOTE — Patient Instructions (Addendum)
We are sorry to hear about the loss of your husband.  Continue your antianxiety medication as needed.  Consider grief counseling.  See orthopedist regarding osteoarthritis of right knee.  Follow-up here for 59-month recheck and fasting labs in 2 months including hemoglobin A1c and fasting lipid panel.  Take care of yourself.  Get plenty of rest and try to get some exercise.

## 2020-03-24 DIAGNOSIS — H40033 Anatomical narrow angle, bilateral: Secondary | ICD-10-CM | POA: Diagnosis not present

## 2020-03-24 DIAGNOSIS — H04123 Dry eye syndrome of bilateral lacrimal glands: Secondary | ICD-10-CM | POA: Diagnosis not present

## 2020-03-24 DIAGNOSIS — H2513 Age-related nuclear cataract, bilateral: Secondary | ICD-10-CM | POA: Diagnosis not present

## 2020-03-24 DIAGNOSIS — H40013 Open angle with borderline findings, low risk, bilateral: Secondary | ICD-10-CM | POA: Diagnosis not present

## 2020-03-24 LAB — HM DIABETES EYE EXAM

## 2020-03-27 ENCOUNTER — Encounter: Payer: Self-pay | Admitting: Internal Medicine

## 2020-03-29 DIAGNOSIS — M1712 Unilateral primary osteoarthritis, left knee: Secondary | ICD-10-CM | POA: Diagnosis not present

## 2020-04-05 DIAGNOSIS — M1712 Unilateral primary osteoarthritis, left knee: Secondary | ICD-10-CM | POA: Diagnosis not present

## 2020-04-12 DIAGNOSIS — M1712 Unilateral primary osteoarthritis, left knee: Secondary | ICD-10-CM | POA: Diagnosis not present

## 2020-04-12 DIAGNOSIS — M1711 Unilateral primary osteoarthritis, right knee: Secondary | ICD-10-CM | POA: Diagnosis not present

## 2020-04-12 DIAGNOSIS — M170 Bilateral primary osteoarthritis of knee: Secondary | ICD-10-CM | POA: Diagnosis not present

## 2020-05-11 ENCOUNTER — Other Ambulatory Visit: Payer: Self-pay

## 2020-05-11 ENCOUNTER — Ambulatory Visit (INDEPENDENT_AMBULATORY_CARE_PROVIDER_SITE_OTHER): Payer: Medicare Other | Admitting: Podiatry

## 2020-05-11 VITALS — Temp 96.6°F

## 2020-05-11 DIAGNOSIS — B351 Tinea unguium: Secondary | ICD-10-CM | POA: Diagnosis not present

## 2020-05-11 DIAGNOSIS — M79675 Pain in left toe(s): Secondary | ICD-10-CM | POA: Diagnosis not present

## 2020-05-11 DIAGNOSIS — M79674 Pain in right toe(s): Secondary | ICD-10-CM | POA: Diagnosis not present

## 2020-05-12 ENCOUNTER — Other Ambulatory Visit: Payer: Medicare Other | Admitting: Internal Medicine

## 2020-05-16 ENCOUNTER — Ambulatory Visit: Payer: Medicare Other | Admitting: Internal Medicine

## 2020-05-24 DIAGNOSIS — M170 Bilateral primary osteoarthritis of knee: Secondary | ICD-10-CM | POA: Diagnosis not present

## 2020-05-25 ENCOUNTER — Other Ambulatory Visit: Payer: Self-pay

## 2020-05-25 ENCOUNTER — Other Ambulatory Visit: Payer: Medicare Other | Admitting: Internal Medicine

## 2020-05-25 DIAGNOSIS — E785 Hyperlipidemia, unspecified: Secondary | ICD-10-CM

## 2020-05-25 DIAGNOSIS — R7302 Impaired glucose tolerance (oral): Secondary | ICD-10-CM

## 2020-05-26 LAB — HEPATIC FUNCTION PANEL
AG Ratio: 1.5 (calc) (ref 1.0–2.5)
ALT: 11 U/L (ref 6–29)
AST: 17 U/L (ref 10–35)
Albumin: 4.4 g/dL (ref 3.6–5.1)
Alkaline phosphatase (APISO): 91 U/L (ref 37–153)
Bilirubin, Direct: 0.1 mg/dL (ref 0.0–0.2)
Globulin: 2.9 g/dL (calc) (ref 1.9–3.7)
Indirect Bilirubin: 0.6 mg/dL (calc) (ref 0.2–1.2)
Total Bilirubin: 0.7 mg/dL (ref 0.2–1.2)
Total Protein: 7.3 g/dL (ref 6.1–8.1)

## 2020-05-26 LAB — HEMOGLOBIN A1C
Hgb A1c MFr Bld: 5.4 % of total Hgb (ref <5.7)
Mean Plasma Glucose: 108 (calc)
eAG (mmol/L): 6 (calc)

## 2020-05-26 LAB — LIPID PANEL
Cholesterol: 241 mg/dL — ABNORMAL HIGH (ref <200)
HDL: 64 mg/dL (ref >=50)
LDL Cholesterol (Calc): 159 mg/dL (calc) — ABNORMAL HIGH
Non-HDL Cholesterol (Calc): 177 mg/dL (calc) — ABNORMAL HIGH (ref <130)
Total CHOL/HDL Ratio: 3.8 (calc) (ref <5.0)
Triglycerides: 78 mg/dL (ref <150)

## 2020-06-01 ENCOUNTER — Ambulatory Visit: Payer: Medicare Other | Admitting: Podiatry

## 2020-06-04 ENCOUNTER — Other Ambulatory Visit: Payer: Self-pay | Admitting: Internal Medicine

## 2020-06-06 ENCOUNTER — Ambulatory Visit (INDEPENDENT_AMBULATORY_CARE_PROVIDER_SITE_OTHER): Payer: Medicare Other | Admitting: Internal Medicine

## 2020-06-06 ENCOUNTER — Other Ambulatory Visit: Payer: Self-pay

## 2020-06-06 ENCOUNTER — Encounter: Payer: Self-pay | Admitting: Internal Medicine

## 2020-06-06 VITALS — BP 120/60 | HR 103 | Ht 64.0 in | Wt 178.0 lb

## 2020-06-06 DIAGNOSIS — J302 Other seasonal allergic rhinitis: Secondary | ICD-10-CM

## 2020-06-06 DIAGNOSIS — R7302 Impaired glucose tolerance (oral): Secondary | ICD-10-CM | POA: Diagnosis not present

## 2020-06-06 DIAGNOSIS — M1712 Unilateral primary osteoarthritis, left knee: Secondary | ICD-10-CM

## 2020-06-06 DIAGNOSIS — F439 Reaction to severe stress, unspecified: Secondary | ICD-10-CM

## 2020-06-06 DIAGNOSIS — E7800 Pure hypercholesterolemia, unspecified: Secondary | ICD-10-CM

## 2020-06-06 DIAGNOSIS — E042 Nontoxic multinodular goiter: Secondary | ICD-10-CM

## 2020-06-06 DIAGNOSIS — I10 Essential (primary) hypertension: Secondary | ICD-10-CM | POA: Diagnosis not present

## 2020-06-06 MED ORDER — ROSUVASTATIN CALCIUM 5 MG PO TABS
ORAL_TABLET | ORAL | 3 refills | Status: DC
Start: 2020-06-06 — End: 2021-04-20

## 2020-06-06 NOTE — Progress Notes (Signed)
° °  Subjective:    [name], female    DOB: [birth year], 79 y.o.   MRN: [id number]  HPI 79 year old Female for 6 month recheck.  May be having left knee arthroplasty in the future.  Has to ambulate with a cane.  Issues with allergic rhinitis.  Cannot tolerate Zyrtec-may cause drowsiness.  May try Claritin.  She has history of multinodular goiter followed by Dr. Dwyane Dee.  Has elevated LDL and does not want to be even on once a week statin medication.  History of essential hypertension, glaucoma, impaired glucose tolerance.  Husband passed away of complications of TXMIW-80.  She is adjusting fairly well.  He had been in a skilled nursing facility for some time.  Lipid panel shows cholesterol elevated at 241 with an LDL cholesterol of 159.  Triglycerides are 78 and HDL cholesterol 64.  Refuses statin medication.  Hemoglobin A1c is excellent at 5.4%.  Liver functions are normal but she is not taking statin.    Review of Systems no new complaints     Objective:   Physical Exam Vitals reviewed.  Constitutional:      Appearance: Normal appearance. She is not diaphoretic.  HENT:     Head: Normocephalic and atraumatic.     Nose: Nose normal.  Neck:     Vascular: No carotid bruit.  Cardiovascular:     Rate and Rhythm: Normal rate and regular rhythm.     Heart sounds: Normal heart sounds. No murmur heard.   Pulmonary:     Effort: Pulmonary effort is normal.     Breath sounds: Normal breath sounds. No wheezing.  Musculoskeletal:     Cervical back: Neck supple.     Right lower leg: No edema.     Left lower leg: No edema.     Comments: Difficulty ambulating due to knee osteoarthritis.  Has to use a cane  Lymphadenopathy:     Cervical: No cervical adenopathy.  Neurological:     Mental Status: She is alert.  Psychiatric:        Mood and Affect: Mood normal.        Thought Content: Thought content normal.        Judgment: Judgment normal.             Assessment & Plan:  Essential hypertension-stable with Maxide and amlodipine  Impaired glucose tolerance-stable with diet  Hyperlipidemia-would like for her to continue with Crestor 5 mg 3 times a week with supper  Seasonal allergic rhinitis-suggest Claritin instead of Zyrtec or Xyzal  Situational stress-improving  History of multinodular goiter-TSH not checked with this visit  Primary osteoarthritis left knee-seeing orthopedist and may need arthroplasty  Plan: Continue current medications and follow-up in 6 months which will be time for Medicare annual wellness and health maintenance exam with fasting labs.  Immunizations are up-to-date.  Have annual flu vaccine in the fall.

## 2020-06-08 ENCOUNTER — Ambulatory Visit: Payer: Medicare Other | Admitting: Podiatry

## 2020-06-12 DIAGNOSIS — M1712 Unilateral primary osteoarthritis, left knee: Secondary | ICD-10-CM | POA: Diagnosis not present

## 2020-06-15 DIAGNOSIS — M1712 Unilateral primary osteoarthritis, left knee: Secondary | ICD-10-CM | POA: Diagnosis not present

## 2020-06-22 DIAGNOSIS — Z1231 Encounter for screening mammogram for malignant neoplasm of breast: Secondary | ICD-10-CM | POA: Diagnosis not present

## 2020-06-22 LAB — HM MAMMOGRAPHY

## 2020-06-23 DIAGNOSIS — M1712 Unilateral primary osteoarthritis, left knee: Secondary | ICD-10-CM | POA: Diagnosis not present

## 2020-06-25 NOTE — Progress Notes (Signed)
  Subjective:  [name], female    DOB: 19-Mar-1941,  MRN: [id number]  Chief Complaint  Patient presents with  . Nail Problem    Thick, long toenails. Onychomycosis.    79 y.o. female presents with the above complaint. History confirmed with patient.  Nails are painful and hard to cut. Reports continued Right leg swelling.  Objective:  Physical Exam: warm, good capillary refill, nail exam onychomycosis of the toenails with pain to palpation, no trophic changes or ulcerative lesions, normal DP and PT pulses, and normal sensory exam. Pitting edema right leg No images are attached to the encounter.  Assessment:   1. Pain due to onychomycosis of toenails of both feet      Plan:  Patient was evaluated and treated and all questions answered.  Onychomycosis -Nails palliatively debrided secondary to pain   Procedure: Nail Debridement Rationale: Patient meets criteria for nail care due to pain Type of Debridement: manual, sharp debridement. Instrumentation: Nail nipper, rotary burr. Number of Nails: 10   No follow-ups on file.

## 2020-06-26 ENCOUNTER — Encounter: Payer: Self-pay | Admitting: Internal Medicine

## 2020-06-26 DIAGNOSIS — M1712 Unilateral primary osteoarthritis, left knee: Secondary | ICD-10-CM | POA: Diagnosis not present

## 2020-06-30 DIAGNOSIS — M1712 Unilateral primary osteoarthritis, left knee: Secondary | ICD-10-CM | POA: Diagnosis not present

## 2020-07-06 ENCOUNTER — Other Ambulatory Visit: Payer: Self-pay

## 2020-07-06 ENCOUNTER — Ambulatory Visit (INDEPENDENT_AMBULATORY_CARE_PROVIDER_SITE_OTHER): Payer: Medicare Other | Admitting: Podiatry

## 2020-07-06 DIAGNOSIS — M79675 Pain in left toe(s): Secondary | ICD-10-CM

## 2020-07-06 DIAGNOSIS — B351 Tinea unguium: Secondary | ICD-10-CM | POA: Diagnosis not present

## 2020-07-06 DIAGNOSIS — M79674 Pain in right toe(s): Secondary | ICD-10-CM | POA: Diagnosis not present

## 2020-07-06 DIAGNOSIS — M1712 Unilateral primary osteoarthritis, left knee: Secondary | ICD-10-CM | POA: Diagnosis not present

## 2020-07-06 NOTE — Progress Notes (Signed)
  Subjective:  [name], female    DOB: 14-Jan-1941,  MRN: [id number]  Chief Complaint  Patient presents with  . Nail Problem    Nail trim 1-5 bilateral    79 y.o. female presents with the above complaint. History confirmed with patient.  Having pain in her toenails  Objective:  Physical Exam: warm, good capillary refill, nail exam onychomycosis of the toenails with pain to palpation, no trophic changes or ulcerative lesions, normal DP and PT pulses, and normal sensory exam. Pitting edema right leg No images are attached to the encounter.  Assessment:   1. Pain due to onychomycosis of toenails of both feet      Plan:  Patient was evaluated and treated and all questions answered.  Onychomycosis -Nails palliatively debrided secondary to pain   Procedure: Nail Debridement Rationale: Pain Type of Debridement: manual, sharp debridement. Instrumentation: Nail nipper, rotary burr. Number of Nails: 10      No follow-ups on file.

## 2020-07-17 ENCOUNTER — Other Ambulatory Visit: Payer: Medicare Other | Admitting: Internal Medicine

## 2020-07-25 ENCOUNTER — Other Ambulatory Visit: Payer: Medicare Other | Admitting: Internal Medicine

## 2020-07-25 ENCOUNTER — Other Ambulatory Visit: Payer: Self-pay

## 2020-07-25 DIAGNOSIS — E7800 Pure hypercholesterolemia, unspecified: Secondary | ICD-10-CM

## 2020-07-25 LAB — HEPATIC FUNCTION PANEL
AG Ratio: 1.5 (calc) (ref 1.0–2.5)
ALT: 10 U/L (ref 6–29)
AST: 16 U/L (ref 10–35)
Albumin: 4.4 g/dL (ref 3.6–5.1)
Alkaline phosphatase (APISO): 98 U/L (ref 37–153)
Bilirubin, Direct: 0.2 mg/dL (ref 0.0–0.2)
Globulin: 3 g/dL (calc) (ref 1.9–3.7)
Indirect Bilirubin: 0.4 mg/dL (calc) (ref 0.2–1.2)
Total Bilirubin: 0.6 mg/dL (ref 0.2–1.2)
Total Protein: 7.4 g/dL (ref 6.1–8.1)

## 2020-07-25 LAB — LIPID PANEL
Cholesterol: 160 mg/dL (ref <200)
HDL: 59 mg/dL (ref >=50)
LDL Cholesterol (Calc): 86 mg/dL (calc)
Non-HDL Cholesterol (Calc): 101 mg/dL (calc) (ref <130)
Total CHOL/HDL Ratio: 2.7 (calc) (ref <5.0)
Triglycerides: 67 mg/dL (ref <150)

## 2020-08-04 ENCOUNTER — Other Ambulatory Visit: Payer: Medicare Other | Admitting: Internal Medicine

## 2020-08-04 ENCOUNTER — Other Ambulatory Visit: Payer: Self-pay

## 2020-08-04 DIAGNOSIS — M1712 Unilateral primary osteoarthritis, left knee: Secondary | ICD-10-CM

## 2020-08-04 DIAGNOSIS — I10 Essential (primary) hypertension: Secondary | ICD-10-CM | POA: Diagnosis not present

## 2020-08-04 DIAGNOSIS — R718 Other abnormality of red blood cells: Secondary | ICD-10-CM | POA: Diagnosis not present

## 2020-08-04 DIAGNOSIS — Z01811 Encounter for preprocedural respiratory examination: Secondary | ICD-10-CM | POA: Diagnosis not present

## 2020-08-04 DIAGNOSIS — R7989 Other specified abnormal findings of blood chemistry: Secondary | ICD-10-CM | POA: Diagnosis not present

## 2020-08-04 DIAGNOSIS — F439 Reaction to severe stress, unspecified: Secondary | ICD-10-CM

## 2020-08-04 DIAGNOSIS — E042 Nontoxic multinodular goiter: Secondary | ICD-10-CM

## 2020-08-04 DIAGNOSIS — J302 Other seasonal allergic rhinitis: Secondary | ICD-10-CM

## 2020-08-04 DIAGNOSIS — Z8541 Personal history of malignant neoplasm of cervix uteri: Secondary | ICD-10-CM | POA: Diagnosis not present

## 2020-08-04 DIAGNOSIS — Z01818 Encounter for other preprocedural examination: Secondary | ICD-10-CM

## 2020-08-04 DIAGNOSIS — E7800 Pure hypercholesterolemia, unspecified: Secondary | ICD-10-CM | POA: Diagnosis not present

## 2020-08-04 DIAGNOSIS — E8881 Metabolic syndrome: Secondary | ICD-10-CM | POA: Diagnosis not present

## 2020-08-04 DIAGNOSIS — Z0000 Encounter for general adult medical examination without abnormal findings: Secondary | ICD-10-CM | POA: Diagnosis not present

## 2020-08-04 DIAGNOSIS — R799 Abnormal finding of blood chemistry, unspecified: Secondary | ICD-10-CM | POA: Diagnosis not present

## 2020-08-04 DIAGNOSIS — R7302 Impaired glucose tolerance (oral): Secondary | ICD-10-CM

## 2020-08-04 DIAGNOSIS — M25569 Pain in unspecified knee: Secondary | ICD-10-CM | POA: Diagnosis not present

## 2020-08-04 NOTE — Addendum Note (Signed)
Addended by: Mady Haagensen on: 08/04/2020 05:32 PM   Modules accepted: Orders

## 2020-08-06 LAB — COMPLETE METABOLIC PANEL WITH GFR
AG Ratio: 1.4 (calc) (ref 1.0–2.5)
ALT: 9 U/L (ref 6–29)
AST: 16 U/L (ref 10–35)
Albumin: 4.4 g/dL (ref 3.6–5.1)
Alkaline phosphatase (APISO): 100 U/L (ref 37–153)
BUN/Creatinine Ratio: 29 (calc) — ABNORMAL HIGH (ref 6–22)
BUN: 32 mg/dL — ABNORMAL HIGH (ref 7–25)
CO2: 23 mmol/L (ref 20–32)
Calcium: 9.7 mg/dL (ref 8.6–10.4)
Chloride: 102 mmol/L (ref 98–110)
Creat: 1.1 mg/dL — ABNORMAL HIGH (ref 0.60–0.93)
GFR, Est African American: 56 mL/min/{1.73_m2} — ABNORMAL LOW (ref >=60)
GFR, Est Non African American: 49 mL/min/{1.73_m2} — ABNORMAL LOW (ref >=60)
Globulin: 3.2 g/dL (calc) (ref 1.9–3.7)
Glucose, Bld: 109 mg/dL — ABNORMAL HIGH (ref 65–99)
Potassium: 4 mmol/L (ref 3.5–5.3)
Sodium: 139 mmol/L (ref 135–146)
Total Bilirubin: 0.6 mg/dL (ref 0.2–1.2)
Total Protein: 7.6 g/dL (ref 6.1–8.1)

## 2020-08-06 LAB — HEMOGLOBIN A1C
Hgb A1c MFr Bld: 5.7 % of total Hgb — ABNORMAL HIGH (ref <5.7)
Mean Plasma Glucose: 117 (calc)
eAG (mmol/L): 6.5 (calc)

## 2020-08-06 LAB — CBC WITH DIFFERENTIAL/PLATELET
Absolute Monocytes: 482 cells/uL (ref 200–950)
Basophils Absolute: 27 cells/uL (ref 0–200)
Basophils Relative: 0.4 %
Eosinophils Absolute: 27 cells/uL (ref 15–500)
Eosinophils Relative: 0.4 %
HCT: 38.2 % (ref 35.0–45.0)
Hemoglobin: 12.3 g/dL (ref 11.7–15.5)
Lymphs Abs: 1213 cells/uL (ref 850–3900)
MCH: 24.8 pg — ABNORMAL LOW (ref 27.0–33.0)
MCHC: 32.2 g/dL (ref 32.0–36.0)
MCV: 77.2 fL — ABNORMAL LOW (ref 80.0–100.0)
MPV: 9.4 fL (ref 7.5–12.5)
Monocytes Relative: 7.2 %
Neutro Abs: 4951 cells/uL (ref 1500–7800)
Neutrophils Relative %: 73.9 %
Platelets: 260 10*3/uL (ref 140–400)
RBC: 4.95 10*6/uL (ref 3.80–5.10)
RDW: 14.8 % (ref 11.0–15.0)
Total Lymphocyte: 18.1 %
WBC: 6.7 10*3/uL (ref 3.8–10.8)

## 2020-08-06 LAB — TEST AUTHORIZATION

## 2020-08-06 LAB — LIPID PANEL
Cholesterol: 159 mg/dL (ref <200)
HDL: 66 mg/dL (ref >=50)
LDL Cholesterol (Calc): 78 mg/dL (calc)
Non-HDL Cholesterol (Calc): 93 mg/dL (calc) (ref <130)
Total CHOL/HDL Ratio: 2.4 (calc) (ref <5.0)
Triglycerides: 74 mg/dL (ref <150)

## 2020-08-06 LAB — PROTIME-INR
INR: 1
Prothrombin Time: 10.4 s (ref 9.0–11.5)

## 2020-08-06 LAB — FERRITIN: Ferritin: 103 ng/mL (ref 16–288)

## 2020-08-06 LAB — TSH: TSH: 1.3 mIU/L (ref 0.40–4.50)

## 2020-08-07 ENCOUNTER — Ambulatory Visit (INDEPENDENT_AMBULATORY_CARE_PROVIDER_SITE_OTHER): Payer: Medicare Other | Admitting: Internal Medicine

## 2020-08-07 ENCOUNTER — Encounter: Payer: Self-pay | Admitting: Internal Medicine

## 2020-08-07 ENCOUNTER — Other Ambulatory Visit: Payer: Self-pay

## 2020-08-07 VITALS — BP 160/88 | HR 119 | Ht 64.0 in | Wt 179.0 lb

## 2020-08-07 DIAGNOSIS — M25562 Pain in left knee: Secondary | ICD-10-CM | POA: Diagnosis not present

## 2020-08-07 DIAGNOSIS — R030 Elevated blood-pressure reading, without diagnosis of hypertension: Secondary | ICD-10-CM | POA: Diagnosis not present

## 2020-08-07 DIAGNOSIS — J3089 Other allergic rhinitis: Secondary | ICD-10-CM

## 2020-08-07 DIAGNOSIS — F411 Generalized anxiety disorder: Secondary | ICD-10-CM

## 2020-08-07 DIAGNOSIS — M1712 Unilateral primary osteoarthritis, left knee: Secondary | ICD-10-CM | POA: Diagnosis not present

## 2020-08-07 DIAGNOSIS — M25662 Stiffness of left knee, not elsewhere classified: Secondary | ICD-10-CM | POA: Diagnosis not present

## 2020-08-07 MED ORDER — METHYLPREDNISOLONE ACETATE 80 MG/ML IJ SUSP
80.0000 mg | Freq: Once | INTRAMUSCULAR | Status: AC
  Administered 2020-08-07: 80 mg via INTRAMUSCULAR

## 2020-08-07 NOTE — Progress Notes (Signed)
   Subjective:    [name], female    DOB: Feb 19, 1941, 79 y.o.   MRN: [id number]  HPI 79 year old Female in today for allergic rhinitis symptoms. Has itchy eyes and itchy ears. She is scheduled for left knee arthroplasty later this month.  She has an appointment for surgical clearance form to be completed on August 11 which was made previously to this visit.  She did not feel that she could wait until planned to have her allergies treated.  She has a history of hypertension and hyperlipidemia.  History of mild anxiety.  History of allergic rhinitis treated with Claritin and Flonase.  History of multinodular goiter followed by Dr. Dwyane Dee.  History of glaucoma and impaired glucose tolerance.  Is on thyroid suppression medication by Dr. Dwyane Dee for goiter.  Has been on Maxide 75/50 since 1999 for hypertension and in 2010 amlodipine was added for improved blood pressure control.  Dr. Kathlene November GYN physician.  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  Husband passed away at skilled nursing facility of complications of FBPPH-43.  Her sister lives here in [place] at a separate residence    Review of Systems she was widowed a few months ago.  Has been residing alone but her sister lives close by.  During recovery from surgery she will be staying with her sister.     Objective:   Physical Exam Blood pressure 160/88 pulse 119 she is anxious in the office today pulse oximetry 97% weight 179 pounds BMI 30.73  Skin warm and dry.  No cervical adenopathy.  She has boggy nasal mucosa bilaterally.  No discharge from the eyes.  No rhinorrhea.  TMs are clear.  Neck is supple.  No adenopathy in cervical area.  Chest is clear to auscultation without rales or wheezing.       Assessment & Plan:  Allergic rhinitis-longstanding  Anxiety related to upcoming surgery  Elevated blood pressure reading due to anxiety  Plan:  Depo-Medrol 80 mg IM works well for her as we have tried this previously.  In fact had virtual visit for this August 26, 2019.  She subsequently came to the office for Depo-Medrol IM.  Given Depo-Medrol 80 mg IM in office today and will follow up in a couple of days for her surgical clearance form to be completed as well as physical examination.

## 2020-08-09 ENCOUNTER — Ambulatory Visit (INDEPENDENT_AMBULATORY_CARE_PROVIDER_SITE_OTHER): Payer: Medicare Other | Admitting: Internal Medicine

## 2020-08-09 ENCOUNTER — Encounter: Payer: Self-pay | Admitting: Internal Medicine

## 2020-08-09 ENCOUNTER — Other Ambulatory Visit: Payer: Self-pay

## 2020-08-09 VITALS — BP 110/68 | HR 105 | Temp 98.0°F | Ht 63.5 in | Wt 176.0 lb

## 2020-08-09 DIAGNOSIS — Z01818 Encounter for other preprocedural examination: Secondary | ICD-10-CM

## 2020-08-09 DIAGNOSIS — Z0000 Encounter for general adult medical examination without abnormal findings: Secondary | ICD-10-CM | POA: Diagnosis not present

## 2020-08-09 DIAGNOSIS — R7989 Other specified abnormal findings of blood chemistry: Secondary | ICD-10-CM

## 2020-08-09 LAB — POCT URINALYSIS DIPSTICK
Appearance: NEGATIVE
Bilirubin, UA: NEGATIVE
Blood, UA: NEGATIVE
Glucose, UA: NEGATIVE
Ketones, UA: NEGATIVE
Leukocytes, UA: NEGATIVE
Nitrite, UA: NEGATIVE
Odor: NEGATIVE
Protein, UA: NEGATIVE
Spec Grav, UA: 1.01 (ref 1.010–1.025)
Urobilinogen, UA: 0.2 E.U./dL
pH, UA: 6.5 (ref 5.0–8.0)

## 2020-08-09 NOTE — Progress Notes (Signed)
Subjective:    [name], female    DOB: [birth year], 79 y.o.   MRN: [id number]  HPI  79 year old Female for pre-op exam for total preoperative evaluation for left TKA scheduled August 23.  She is having considerable pain and trouble ambulating due to end-stage osteoarthritis of left knee.  She has a history of multinodular goiter followed by Dr. Dwyane Dee.  History of elevated LDL cholesterol but since starting statin 3 times a week her lipids are now normal.  History of essential hypertension, glaucoma and impaired glucose tolerance.  History of benign colon polyp.  Osteoarthritis of both knees.  History of allergic rhinitis treated with Depo-Medrol IM from time to time.  Had ultrasound-guided needle biopsy aspirate of thyroid gland in 2012 which was benign.  Had large colon polyp removed at [facility] in [place] that was benign and number of years ago.  She is on thyroid suppression by Dr. Dwyane Dee for goiter.  History of hemorrhoids and rectal itching for which she uses clotrimazole.  Has been on Maxide 75/50 since 1999 for hypertension.  In 2010 amlodipine was added for improved control of blood pressure.  History of anxiety and depression and situational stress.  Husband passed away of complications of [id number] in the nursing home a few months ago.  She is adjusting fairly well.  Hysterectomy without oophorectomy for cervical cancer in 1977 in [place].  Dr. Rutherford Limerick is her GYN physician.  Right hammertoe surgery 2002  History of vitamin D deficiency  She is intolerant of penicillin-causes chest pain.  Intolerant of codeine.  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  She is retired Pharmacist, hospital.  No children.  Sister is now living here in [place] in a separate residence.  Family history: Mother deceased with history of hypertension and dementia.  She had kidney failure.  Sister who lives here apparently is  healthy.  Father deceased with history of diabetes and hypertension.          Review of Systems bilateral knee pain left worse than right.  No chest pain or shortness of breath.  No abdominal pain.  Recent bout of allergic rhinitis treated with Depo-Medrol IM.     Objective:   Physical Exam Blood pressure 110/68 pulse 105-patient is anxious temperature 98 degrees orally pulse oximetry 96% BMI 30.6-minute  Skin warm and dry.  Nodes none.  Neck is supple without JVD  or carotid bruits.  Thyroid is symmetrically enlarged consistent with goiter.  Chest clear to auscultation without rales or wheezing.  Cardiac exam regular rate and rhythm normal S1 and S2  No lower extremity pitting edema.  Neuro intact without gross focal deficits on brief neurological exam.  Affect thought and judgment are normal.       Assessment & Plan:  End-stage osteoarthritis left knee scheduled for left TKA later this month.  History of Goiter followed by Dr. Dwyane Dee and is on thyroid suppression therapy.  TSH is normal.  Hypertension- stable on current regimen of diuretic and amlodipine  Hyperlipidemia-until recently she has not wanted to be on statin therapy but now is on Crestor 5 mg 3 times a week and lipid panel is normal  History of impaired glucose tolerance-hemoglobin A1c stable at 5.7%  Microcytosis but no anemia.  This has been previously evaluated and felt to be familial.  Has had low MCV for years without evidence of iron deficiency.  Allergic rhinitis treated with as  needed Depo-Medrol usually not more than once or twice a year.  Plan: Patient is approved to undergo left TKA later this month.  Continue current medications.  Has Medicare wellness visit here in November.

## 2020-08-10 LAB — CREATININE, SERUM: Creat: 1.06 mg/dL — ABNORMAL HIGH (ref 0.60–0.93)

## 2020-08-11 NOTE — Patient Instructions (Addendum)
DUE TO COVID-19 ONLY ONE VISITOR IS ALLOWED TO COME WITH YOU AND STAY IN THE WAITING ROOM ONLY DURING PRE  OP AND  PROCEDURE.   IF YOU WILL BE ADMITTED INTO THE HOSPITAL YOU ARE ALLOWED ONE SUPPORT PERSON DURING VISITATION HOURS ONLY  (10AM -8PM)   . The support person may change daily. . The support person must pass our screening, gel in and out, and wear a mask at all times, including in the patient's room. . Patients must also wear a mask when staff or their support person are in the room.   COVID SWAB TESTING MUST BE COMPLETED ON:   Thursday, 08-17-2020 @ 2:50 PM    [address] [place], [place] [address]  (Must self quarantine after testing. Follow instructions on handout.)       Your procedure is scheduled on:  Monday, 08-21-2020   Report to [facility] Main  Entrance   Report to admitting at 7:00 AM   Call this number if you have problems the morning of surgery [phone]   Do not eat food :After Midnight.   May have liquids until 6:25 AM  day of surgery   CLEAR LIQUID DIET  Foods Allowed                                                                     Foods Excluded  Water, Black Coffee and tea, regular and decaf             liquids that you cannot  Plain Jell-O in any flavor  (No red)                                    see through such as: Fruit ices (not with fruit pulp)                                      milk, soups, orange juice              Iced Popsicles (No red)                                      All solid food                                   Apple juices Sports drinks like Gatorade (No red) Lightly seasoned clear broth or consume(fat free) Sugar, honey syrup    Complete one Ensure drink the morning of surgery at 6:25 AM the day of surgery.    Oral Hygiene is also important to reduce your risk of infection.                                     Remember - BRUSH YOUR TEETH THE MORNING OF SURGERY WITH YOUR REGULAR TOOTHPASTE   Do NOT smoke  after Midnight   Take  these medicines the morning of surgery with A SIP OF WATER:  Amlodipine                                You may not have any metal on your body including hair pins, jewelry, and body piercings             Do not wear make-up, lotions, powders, perfumes/cologne, or deodorant             Do not wear nail polish.  Do not shave  48 hours prior to surgery.              Do not bring valuables to the hospital. [place].   Contacts, dentures or bridgework may not be worn into surgery.   Bring small overnight bag day of surgery.                  Please read over the following fact sheets you were given: IF YOU HAVE QUESTIONS ABOUT YOUR PRE OP INSTRUCTIONS PLEASE  CALL  [phone]   [facility] - Preparing for Surgery Before surgery, you can play an important role.  Because skin is not sterile, your skin needs to be as free of germs as possible.  You can reduce the number of germs on your skin by washing with CHG (chlorahexidine gluconate) soap before surgery.  CHG is an antiseptic cleaner which kills germs and bonds with the skin to continue killing germs even after washing. Please DO NOT use if you have an allergy to CHG or antibacterial soaps.  If your skin becomes reddened/irritated stop using the CHG and inform your nurse when you arrive at Short Stay. Do not shave (including legs and underarms) for at least 48 hours prior to the first CHG shower.  You may shave your face/neck.  Please follow these instructions carefully:  1.  Shower with CHG Soap the night before surgery and the  morning of surgery.  2.  If you choose to wash your hair, wash your hair first as usual with your normal  shampoo.  3.  After you shampoo, rinse your hair and body thoroughly to remove the shampoo.                             4.  Use CHG as you would any other liquid soap.  You can apply chg directly to the skin and wash.  Gently with a scrungie or clean  washcloth.  5.  Apply the CHG Soap to your body ONLY FROM THE NECK DOWN.   Do   not use on face/ open                           Wound or open sores. Avoid contact with eyes, ears mouth and   genitals (private parts).                       Wash face,  Genitals (private parts) with your normal soap.             6.  Wash thoroughly, paying special attention to the area where your    surgery  will be performed.  7.  Thoroughly rinse your body with warm water from the neck down.  8.  DO NOT  shower/wash with your normal soap after using and rinsing off the CHG Soap.                9.  Pat yourself dry with a clean towel.            10.  Wear clean pajamas.            11.  Place clean sheets on your bed the night of your first shower and do not  sleep with pets. Day of Surgery : Do not apply any lotions/deodorants the morning of surgery.  Please wear clean clothes to the hospital/surgery center.  FAILURE TO FOLLOW THESE INSTRUCTIONS MAY RESULT IN THE CANCELLATION OF YOUR SURGERY  PATIENT SIGNATURE_________________________________  NURSE SIGNATURE__________________________________  ________________________________________________________________________   Adam Phenix  An incentive spirometer is a tool that can help keep your lungs clear and active. This tool measures how well you are filling your lungs with each breath. Taking long deep breaths may help reverse or decrease the chance of developing breathing (pulmonary) problems (especially infection) following:  A long period of time when you are unable to move or be active. BEFORE THE PROCEDURE   If the spirometer includes an indicator to show your best effort, your nurse or respiratory therapist will set it to a desired goal.  If possible, sit up straight or lean slightly forward. Try not to slouch.  Hold the incentive spirometer in an upright position. INSTRUCTIONS FOR USE  1. Sit on the edge of your bed if possible, or sit up  as far as you can in bed or on a chair. 2. Hold the incentive spirometer in an upright position. 3. Breathe out normally. 4. Place the mouthpiece in your mouth and seal your lips tightly around it. 5. Breathe in slowly and as deeply as possible, raising the piston or the ball toward the top of the column. 6. Hold your breath for 3-5 seconds or for as long as possible. Allow the piston or ball to fall to the bottom of the column. 7. Remove the mouthpiece from your mouth and breathe out normally. 8. Rest for a few seconds and repeat Steps 1 through 7 at least 10 times every 1-2 hours when you are awake. Take your time and take a few normal breaths between deep breaths. 9. The spirometer may include an indicator to show your best effort. Use the indicator as a goal to work toward during each repetition. 10. After each set of 10 deep breaths, practice coughing to be sure your lungs are clear. If you have an incision (the cut made at the time of surgery), support your incision when coughing by placing a pillow or rolled up towels firmly against it. Once you are able to get out of bed, walk around indoors and cough well. You may stop using the incentive spirometer when instructed by your caregiver.  RISKS AND COMPLICATIONS  Take your time so you do not get dizzy or light-headed.  If you are in pain, you may need to take or ask for pain medication before doing incentive spirometry. It is harder to take a deep breath if you are having pain. AFTER USE  Rest and breathe slowly and easily.  It can be helpful to keep track of a log of your progress. Your caregiver can provide you with a simple table to help with this. If you are using the spirometer at home, follow these instructions: [place] IF:   You are having difficultly using  the spirometer.  You have trouble using the spirometer as often as instructed.  Your pain medication is not giving enough relief while using the  spirometer.  You develop fever of 100.5 F (38.1 C) or higher. SEEK IMMEDIATE MEDICAL CARE IF:   You cough up bloody sputum that had not been present before.  You develop fever of 102 F (38.9 C) or greater.  You develop worsening pain at or near the incision site. MAKE SURE YOU:   Understand these instructions.  Will watch your condition.  Will get help right away if you are not doing well or get worse. Document Released: 04/28/2007 Document Revised: 03/09/2012 Document Reviewed: 06/29/2007 ExitCare Patient Information 2014 Exit[employer], [place].   ________________________________________________________________________  WHAT IS A BLOOD TRANSFUSION? Blood Transfusion Information  A transfusion is the replacement of blood or some of its parts. Blood is made up of multiple cells which provide different functions.  Red blood cells carry oxygen and are used for blood loss replacement.  White blood cells fight against infection.  Platelets control bleeding.  Plasma helps clot blood.  Other blood products are available for specialized needs, such as hemophilia or other clotting disorders. BEFORE THE TRANSFUSION  Who gives blood for transfusions?   Healthy volunteers who are fully evaluated to make sure their blood is safe. This is blood bank blood. Transfusion therapy is the safest it has ever been in the practice of medicine. Before blood is taken from a donor, a complete history is taken to make sure that person has no history of diseases nor engages in risky social behavior (examples are intravenous drug use or sexual activity with multiple partners). The donor's travel history is screened to minimize risk of transmitting infections, such as malaria. The donated blood is tested for signs of infectious diseases, such as HIV and hepatitis. The blood is then tested to be sure it is compatible with you in order to minimize the chance of a transfusion reaction. If you or a relative  donates blood, this is often done in anticipation of surgery and is not appropriate for emergency situations. It takes many days to process the donated blood. RISKS AND COMPLICATIONS Although transfusion therapy is very safe and saves many lives, the main dangers of transfusion include:   Getting an infectious disease.  Developing a transfusion reaction. This is an allergic reaction to something in the blood you were given. Every precaution is taken to prevent this. The decision to have a blood transfusion has been considered carefully by your caregiver before blood is given. Blood is not given unless the benefits outweigh the risks. AFTER THE TRANSFUSION  Right after receiving a blood transfusion, you will usually feel much better and more energetic. This is especially true if your red blood cells have gotten low (anemic). The transfusion raises the level of the red blood cells which carry oxygen, and this usually causes an energy increase.  The nurse administering the transfusion will monitor you carefully for complications. HOME CARE INSTRUCTIONS  No special instructions are needed after a transfusion. You may find your energy is better. Speak with your caregiver about any limitations on activity for underlying diseases you may have. SEEK MEDICAL CARE IF:   Your condition is not improving after your transfusion.  You develop redness or irritation at the intravenous (IV) site. SEEK IMMEDIATE MEDICAL CARE IF:  Any of the following symptoms occur over the next 12 hours:  Shaking chills.  You have a temperature by mouth above 102 F (  38.9 C), not controlled by medicine.  Chest, back, or muscle pain.  People around you feel you are not acting correctly or are confused.  Shortness of breath or difficulty breathing.  Dizziness and fainting.  You get a rash or develop hives.  You have a decrease in urine output.  Your urine turns a dark color or changes to pink, red, or brown. Any  of the following symptoms occur over the next 10 days:  You have a temperature by mouth above 102 F (38.9 C), not controlled by medicine.  Shortness of breath.  Weakness after normal activity.  The white part of the eye turns yellow (jaundice).  You have a decrease in the amount of urine or are urinating less often.  Your urine turns a dark color or changes to pink, red, or brown. Document Released: 12/13/2000 Document Revised: 03/09/2012 Document Reviewed: 08/01/2008 [facility] Patient Information 2014 [place], [place].  _______________________________________________________________________

## 2020-08-11 NOTE — Progress Notes (Addendum)
COVID Vaccine Completed: x2 Date COVID Vaccine completed: 01-19-20 & 02-09-20 COVID vaccine manufacturer: [place]   PCP - Tedra Senegal, MD (recent note on chart) Cardiologist -   Chest x-ray -  EKG - 08-17-2020 in Epic Stress Test -  ECHO -  Cardiac Cath -   Sleep Study -  CPAP -   Fasting Blood Sugar -  Checks Blood Sugar _____ times a day  Blood Thinner Instructions: Aspirin Instructions: Last Dose:  Anesthesia review:   Patient denies shortness of breath, fever, cough and chest pain at PAT appointment   Patient verbalized understanding of instructions that were given to them at the PAT appointment. Patient was also instructed that they will need to review over the PAT instructions again at home before surgery.

## 2020-08-13 NOTE — Patient Instructions (Signed)
Depo-Medrol 80 mg IM given in office today for allergic rhinitis symptoms.  She will return in 2 days for surgical clearance with form to be completed.

## 2020-08-15 ENCOUNTER — Telehealth: Payer: Self-pay | Admitting: Internal Medicine

## 2020-08-15 NOTE — Telephone Encounter (Signed)
Faxed surgery clearance form , labs, to [facility] attn: Glendale Chard fax [phone], phone [phone]

## 2020-08-16 NOTE — Patient Instructions (Signed)
Approved for left TKA later this month.  Continue current medications.  Return in November for Medicare wellness visit.

## 2020-08-17 ENCOUNTER — Other Ambulatory Visit: Payer: Self-pay

## 2020-08-17 ENCOUNTER — Encounter (HOSPITAL_COMMUNITY): Payer: Self-pay

## 2020-08-17 ENCOUNTER — Other Ambulatory Visit (HOSPITAL_COMMUNITY)
Admission: RE | Admit: 2020-08-17 | Discharge: 2020-08-17 | Disposition: A | Discharge status: Home / Self Care | Payer: Medicare Other | Source: Ambulatory Visit | Attending: Orthopedic Surgery | Admitting: Orthopedic Surgery

## 2020-08-17 ENCOUNTER — Encounter (HOSPITAL_COMMUNITY)
Admission: RE | Admit: 2020-08-17 | Discharge: 2020-08-17 | Disposition: A | Discharge status: Home / Self Care | Payer: Medicare Other | Source: Ambulatory Visit | Attending: Orthopedic Surgery | Admitting: Orthopedic Surgery

## 2020-08-17 DIAGNOSIS — Z20822 Contact with and (suspected) exposure to covid-19: Secondary | ICD-10-CM | POA: Diagnosis not present

## 2020-08-17 DIAGNOSIS — Z01818 Encounter for other preprocedural examination: Secondary | ICD-10-CM | POA: Diagnosis not present

## 2020-08-17 HISTORY — DX: Headache, unspecified: R51.9

## 2020-08-17 HISTORY — DX: Unspecified osteoarthritis, unspecified site: M19.90

## 2020-08-17 HISTORY — DX: Nontoxic goiter, unspecified: E04.9

## 2020-08-17 HISTORY — DX: Other complications of anesthesia, initial encounter: T88.59XA

## 2020-08-17 LAB — CBC
HCT: 40.3 % (ref 36.0–46.0)
Hemoglobin: 12.3 g/dL (ref 12.0–15.0)
MCH: 24.3 pg — ABNORMAL LOW (ref 26.0–34.0)
MCHC: 30.5 g/dL (ref 30.0–36.0)
MCV: 79.5 fL — ABNORMAL LOW (ref 80.0–100.0)
Platelets: 231 10*3/uL (ref 150–400)
RBC: 5.07 MIL/uL (ref 3.87–5.11)
RDW: 16.1 % — ABNORMAL HIGH (ref 11.5–15.5)
WBC: 7.8 10*3/uL (ref 4.0–10.5)
nRBC: 0 % (ref 0.0–0.2)

## 2020-08-17 LAB — SURGICAL PCR SCREEN
MRSA, PCR: NEGATIVE
Staphylococcus aureus: NEGATIVE

## 2020-08-17 LAB — COMPREHENSIVE METABOLIC PANEL
ALT: 14 U/L (ref 0–44)
AST: 19 U/L (ref 15–41)
Albumin: 4.1 g/dL (ref 3.5–5.0)
Alkaline Phosphatase: 89 U/L (ref 38–126)
Anion gap: 11 (ref 5–15)
BUN: 31 mg/dL — ABNORMAL HIGH (ref 8–23)
CO2: 31 mmol/L (ref 22–32)
Calcium: 9.5 mg/dL (ref 8.9–10.3)
Chloride: 102 mmol/L (ref 98–111)
Creatinine, Ser: 1.22 mg/dL — ABNORMAL HIGH (ref 0.44–1.00)
GFR calc Af Amer: 50 mL/min — ABNORMAL LOW (ref >60)
GFR calc non Af Amer: 43 mL/min — ABNORMAL LOW (ref >60)
Glucose, Bld: 103 mg/dL — ABNORMAL HIGH (ref 70–99)
Potassium: 3.8 mmol/L (ref 3.5–5.1)
Sodium: 144 mmol/L (ref 135–145)
Total Bilirubin: 0.9 mg/dL (ref 0.3–1.2)
Total Protein: 7.7 g/dL (ref 6.5–8.1)

## 2020-08-17 LAB — APTT: aPTT: 40 seconds — ABNORMAL HIGH (ref 24–36)

## 2020-08-17 LAB — SARS CORONAVIRUS 2 (TAT 6-24 HRS): SARS Coronavirus 2: NEGATIVE

## 2020-08-17 LAB — PROTIME-INR
INR: 1 (ref 0.8–1.2)
Prothrombin Time: 12.5 seconds (ref 11.4–15.2)

## 2020-08-17 NOTE — Progress Notes (Signed)
CMP and PTT sent to Dr. Wynelle Link to review

## 2020-08-20 MED ORDER — BUPIVACAINE LIPOSOME 1.3 % IJ SUSP
20.0000 mL | Freq: Once | INTRAMUSCULAR | Status: DC
  Filled 2020-08-20: qty 20

## 2020-08-20 NOTE — H&P (Signed)
TOTAL KNEE ADMISSION H&P  Patient is being admitted for left total knee arthroplasty.  Subjective:  Chief Complaint:left knee pain.  HPI: Lisa [name], 79 y.o. female, has a history of pain and functional disability in the left knee due to arthritis and has failed non-surgical conservative treatments for greater than 12 weeks to includecorticosteriod injections, viscosupplementation injections and activity modification.  Onset of symptoms was gradual, starting 2 years ago with gradually worsening course since that time. The patient noted no past surgery on the left knee(s).  Patient currently rates pain in the left knee(s) at 7 out of 10 with activity. Patient has worsening of pain with activity and weight bearing and pain that interferes with activities of daily living.  Patient has evidence of joint space narrowing by imaging studies.  There is no active infection.  Patient Active Problem List   Diagnosis Date Noted  . Arthritis of left knee 07/01/2018  . Osteoarthritis 03/19/2018  . Impaired glucose tolerance 02/11/2017  . Multinodular goiter 01/24/2014  . Osteoarthritis of left knee 02/16/2013  . Anxiety 12/05/2012  . Hyperlipemia 07/23/2012  . History of glaucoma 07/23/2012  . Obesity 09/25/2011  . Thyromegaly 09/25/2011  . Thyroid nodule 09/25/2011  . Allergic rhinitis 09/25/2011  . History of cervical cancer 09/25/2011  . Hypertension 08/26/2011   Past Medical History:  Diagnosis Date  . Allergy   . Arthritis   . Cancer ([place])    cervical  . Complication of anesthesia    Difficulty waking up after hysterectomy in 1973  . Goiter   . Headache    Sinus headaches  . Hyperlipidemia   . Hypertension   . Obesity   . Osteopenia   . Vitamin D deficiency     Past Surgical History:  Procedure Laterality Date  . ABDOMINAL HYSTERECTOMY    . EYE SURGERY    . HAMMER TOE SURGERY  12/02   right  . WISDOM TOOTH EXTRACTION      Current Facility-Administered Medications    Medication Dose Route Frequency Provider Last Rate Last Admin  . [START ON 08/21/2020] bupivacaine liposome (EXPAREL) 1.3 % injection 266 mg  20 mL Other Once Gaynelle Arabian, MD       Current Outpatient Medications  Medication Sig Dispense Refill Last Dose  . acetaminophen (TYLENOL) 500 MG tablet Take 500-1,000 mg by mouth every 6 (six) hours as needed for mild pain or moderate pain.     Marland Kitchen amLODipine (NORVASC) 5 MG tablet TAKE 1 TABLET(5 MG) BY MOUTH DAILY (Patient taking differently: Take 5 mg by mouth daily. ) 90 tablet 3   . fluticasone (FLONASE) 50 MCG/ACT nasal spray Place 2 sprays into both nostrils daily as needed for allergies or rhinitis.     Marland Kitchen MAXZIDE 75-50 MG tablet TAKE 1 TABLET BY MOUTH EVERY DAY (Patient taking differently: Take 1 tablet by mouth daily. ) 90 tablet 1   . OVER THE COUNTER MEDICATION Apply 1 application topically daily as needed (apply to Knees). Unkers     . rosuvastatin (CRESTOR) 5 MG tablet One po 3 times a week with supper (Patient taking differently: Take 5 mg by mouth 3 (three) times a week. with supper) 36 tablet 3   . loratadine (CLARITIN) 10 MG tablet Take 1 tablet (10 mg total) by mouth daily. (Patient not taking: Reported on 08/09/2020) 30 tablet 11 Not Taking at Unknown time   Allergies  Allergen Reactions  . Penicillins Other (See Comments)    Chest pain 12 years  .  Codeine Other (See Comments)    Hard time waking-up  . Darvon Nausea And Vomiting  . Tall Ragweed Itching and Other (See Comments)    Seasonal  Congestion/ watery eyes/ Sneezing and chest congestion, itching eyes and ears T[place]     Social History   Tobacco Use  . Smoking status: Never Smoker  . Smokeless tobacco: Never Used  Substance Use Topics  . Alcohol use: No    Comment: Wine once or twice a year    Family History  Problem Relation Age of Onset  . Hypertension Mother   . Heart disease Father   . Hypertension Father   . Thyroid disease Father      Review of  Systems  Constitutional: Negative for chills and fever.  Respiratory: Negative for cough and shortness of breath.   Cardiovascular: Negative for chest pain.  Gastrointestinal: Negative for nausea and vomiting.  Musculoskeletal: Positive for arthralgias.    Objective:  Physical Exam Patient is a 79 year old female.  Well nourished and well developed. General: Alert and oriented x3, cooperative and pleasant, no acute distress. Head: normocephalic, atraumatic, neck supple. Eyes: EOMI. Respiratory: breath sounds clear in all fields, no wheezing, rales, or rhonchi. Cardiovascular: Regular rate and rhythm, no murmurs, gallops or rubs. Abdomen: non-tender to palpation and soft, normoactive bowel sounds.  Musculoskeletal: Left Knee Exam: Valgus deformity. Trace effusion present. No swelling present. The range of motion is: 5 to 125 degrees. No crepitus on range of motion of the knee. Positive medial joint line tenderness. Positive lateral joint line tenderness. The knee is stable.  Calves soft and nontender. Motor function intact in LE. Strength 5/5 LE bilaterally. Neuro: Distal pulses 2+. Sensation to light touch intact in LE Vital signs in last 24 hours:    Labs:   Estimated body mass index is 30.21 kg/m as calculated from the following:   Height as of 08/17/20: 5\' 4"  (1.626 m).   Weight as of 08/09/20: 79.8 kg.   Imaging Review Plain radiographs demonstrate severe degenerative joint disease of the left knee(s). The overall alignment isneutral. The bone quality appears to be adequate for age and reported activity level.   Assessment/Plan:  End stage arthritis, left knee   The patient history, physical examination, clinical judgment of the provider and imaging studies are consistent with end stage degenerative joint disease of the left knee(s) and total knee arthroplasty is deemed medically necessary. The treatment options including medical management, injection therapy  arthroscopy and arthroplasty were discussed at length. The risks and benefits of total knee arthroplasty were presented and reviewed. The risks due to aseptic loosening, infection, stiffness, patella tracking problems, thromboembolic complications and other imponderables were discussed. The patient acknowledged the explanation, agreed to proceed with the plan and consent was signed. Patient is being admitted for inpatient treatment for surgery, pain control, PT, OT, prophylactic antibiotics, VTE prophylaxis, progressive ambulation and ADL's and discharge planning. The patient is planning to be discharged home.  Therapy Plans: outpatient therapy at [facility] Disposition: Home with sister Planned DVT Prophylaxis: aspirin 325mg  BID DME needed: walker, 3-n-1 PCP: Tedra Senegal, appointment on 8/11 TXA: IV Allergies: codeine - drowsy & nauseous, PCN - childhood hives & chest pain, Darvon - vomiting Anesthesia Concerns: none BMI: 32.1 Not diabetic.  Other: Concerned about being very drowsy & nauseated with narcotic pain medication in the past.   Patient's anticipated LOS is less than 2 midnights, meeting these requirements: - Younger than 89 - Lives within 1 hour of  care - Has a competent adult at home to recover with post-op recover - NO history of  - Chronic pain requiring opiods  - Diabetes  - Coronary Artery Disease  - Heart failure  - Heart attack  - Stroke  - DVT/VTE  - Cardiac arrhythmia  - Respiratory Failure/COPD  - Renal failure  - Anemia  - Advanced Liver disease  - Patient was instructed on what medications to stop prior to surgery. - Follow-up visit in 2 weeks with Dr. Wynelle Link - Begin physical therapy following surgery - Pre-operative lab work as pre-surgical testing - Prescriptions will be provided in hospital at time of discharge  Griffith Citron, PA-C Orthopedic Surgery [facility] [place] [phone]

## 2020-08-21 ENCOUNTER — Other Ambulatory Visit: Payer: Self-pay

## 2020-08-21 ENCOUNTER — Encounter (HOSPITAL_COMMUNITY)
Admission: AC | Disposition: A | Discharge status: Home / Self Care | Payer: Self-pay | Source: Home / Self Care | Attending: Orthopedic Surgery

## 2020-08-21 ENCOUNTER — Ambulatory Visit (HOSPITAL_COMMUNITY): Payer: Medicare Other | Admitting: Physician Assistant

## 2020-08-21 ENCOUNTER — Encounter (HOSPITAL_COMMUNITY): Payer: Self-pay | Admitting: Orthopedic Surgery

## 2020-08-21 ENCOUNTER — Ambulatory Visit (HOSPITAL_COMMUNITY): Payer: Medicare Other | Admitting: Certified Registered"

## 2020-08-21 ENCOUNTER — Inpatient Hospital Stay (HOSPITAL_COMMUNITY)
Admission: AC | Admit: 2020-08-21 | Discharge: 2020-08-23 | DRG: 470 | Disposition: A | Discharge status: Home / Self Care | Payer: Medicare Other | Attending: Orthopedic Surgery | Admitting: Orthopedic Surgery

## 2020-08-21 DIAGNOSIS — I10 Essential (primary) hypertension: Secondary | ICD-10-CM | POA: Diagnosis not present

## 2020-08-21 DIAGNOSIS — E669 Obesity, unspecified: Secondary | ICD-10-CM | POA: Diagnosis not present

## 2020-08-21 DIAGNOSIS — Z8349 Family history of other endocrine, nutritional and metabolic diseases: Secondary | ICD-10-CM | POA: Diagnosis not present

## 2020-08-21 DIAGNOSIS — M25762 Osteophyte, left knee: Secondary | ICD-10-CM | POA: Diagnosis not present

## 2020-08-21 DIAGNOSIS — Z6830 Body mass index (BMI) 30.0-30.9, adult: Secondary | ICD-10-CM

## 2020-08-21 DIAGNOSIS — M1712 Unilateral primary osteoarthritis, left knee: Principal | ICD-10-CM | POA: Diagnosis present

## 2020-08-21 DIAGNOSIS — E042 Nontoxic multinodular goiter: Secondary | ICD-10-CM | POA: Diagnosis not present

## 2020-08-21 DIAGNOSIS — Z885 Allergy status to narcotic agent status: Secondary | ICD-10-CM | POA: Diagnosis not present

## 2020-08-21 DIAGNOSIS — Z8249 Family history of ischemic heart disease and other diseases of the circulatory system: Secondary | ICD-10-CM | POA: Diagnosis not present

## 2020-08-21 DIAGNOSIS — F419 Anxiety disorder, unspecified: Secondary | ICD-10-CM | POA: Diagnosis present

## 2020-08-21 DIAGNOSIS — Z880 Allergy status to penicillin: Secondary | ICD-10-CM | POA: Diagnosis not present

## 2020-08-21 DIAGNOSIS — Z91048 Other nonmedicinal substance allergy status: Secondary | ICD-10-CM | POA: Diagnosis not present

## 2020-08-21 DIAGNOSIS — Z79899 Other long term (current) drug therapy: Secondary | ICD-10-CM | POA: Diagnosis not present

## 2020-08-21 DIAGNOSIS — Z8541 Personal history of malignant neoplasm of cervix uteri: Secondary | ICD-10-CM | POA: Diagnosis not present

## 2020-08-21 DIAGNOSIS — M25562 Pain in left knee: Secondary | ICD-10-CM | POA: Diagnosis present

## 2020-08-21 DIAGNOSIS — E559 Vitamin D deficiency, unspecified: Secondary | ICD-10-CM | POA: Diagnosis not present

## 2020-08-21 DIAGNOSIS — Z888 Allergy status to other drugs, medicaments and biological substances status: Secondary | ICD-10-CM

## 2020-08-21 DIAGNOSIS — E785 Hyperlipidemia, unspecified: Secondary | ICD-10-CM | POA: Diagnosis present

## 2020-08-21 DIAGNOSIS — G8918 Other acute postprocedural pain: Secondary | ICD-10-CM | POA: Diagnosis not present

## 2020-08-21 HISTORY — PX: TOTAL KNEE ARTHROPLASTY: SHX125

## 2020-08-21 LAB — TYPE AND SCREEN
ABO/RH(D): O POS
Antibody Screen: NEGATIVE

## 2020-08-21 LAB — ABO/RH: ABO/RH(D): O POS

## 2020-08-21 SURGERY — ARTHROPLASTY, KNEE, TOTAL
Anesthesia: Spinal | Site: Knee | Laterality: Left

## 2020-08-21 MED ORDER — AMLODIPINE BESYLATE 5 MG PO TABS
5.0000 mg | ORAL_TABLET | Freq: Every day | ORAL | Status: DC
  Administered 2020-08-23: 5 mg via ORAL
  Filled 2020-08-21: qty 1

## 2020-08-21 MED ORDER — METOCLOPRAMIDE HCL 5 MG PO TABS: 5.0000 mg | ORAL_TABLET | Freq: Three times a day (TID) | ORAL | Status: DC | PRN

## 2020-08-21 MED ORDER — TRIAMTERENE-HCTZ 75-50 MG PO TABS
1.0000 | ORAL_TABLET | Freq: Every day | ORAL | Status: DC
  Administered 2020-08-23: 1 via ORAL
  Filled 2020-08-21 (×2): qty 1

## 2020-08-21 MED ORDER — METOCLOPRAMIDE HCL 5 MG/ML IJ SOLN
5.0000 mg | Freq: Three times a day (TID) | INTRAMUSCULAR | Status: DC | PRN
  Administered 2020-08-22: 10 mg via INTRAVENOUS
  Filled 2020-08-21: qty 2

## 2020-08-21 MED ORDER — ACETAMINOPHEN 500 MG PO TABS
1000.0000 mg | ORAL_TABLET | Freq: Four times a day (QID) | ORAL | Status: AC
  Administered 2020-08-21 – 2020-08-22 (×3): 1000 mg via ORAL
  Filled 2020-08-21 (×4): qty 2

## 2020-08-21 MED ORDER — SODIUM CHLORIDE (PF) 0.9 % IJ SOLN
INTRAMUSCULAR | Status: DC | PRN
  Administered 2020-08-21: 60 mL

## 2020-08-21 MED ORDER — DEXAMETHASONE SODIUM PHOSPHATE 10 MG/ML IJ SOLN
10.0000 mg | Freq: Once | INTRAMUSCULAR | Status: AC
  Administered 2020-08-22: 10 mg via INTRAVENOUS
  Filled 2020-08-21: qty 1

## 2020-08-21 MED ORDER — MIDAZOLAM HCL 2 MG/2ML IJ SOLN
INTRAMUSCULAR | Status: AC
  Administered 2020-08-21: 1 mg
  Filled 2020-08-21: qty 2

## 2020-08-21 MED ORDER — ONDANSETRON HCL 4 MG/2ML IJ SOLN
INTRAMUSCULAR | Status: DC | PRN
  Administered 2020-08-21: 4 mg via INTRAVENOUS

## 2020-08-21 MED ORDER — MORPHINE SULFATE (PF) 2 MG/ML IV SOLN: 0.5000 mg | INTRAVENOUS | Status: DC | PRN

## 2020-08-21 MED ORDER — MENTHOL 3 MG MT LOZG: 1.0000 | LOZENGE | OROMUCOSAL | Status: DC | PRN

## 2020-08-21 MED ORDER — ASPIRIN EC 325 MG PO TBEC
325.0000 mg | DELAYED_RELEASE_TABLET | Freq: Two times a day (BID) | ORAL | Status: DC
  Administered 2020-08-22 – 2020-08-23 (×2): 325 mg via ORAL
  Filled 2020-08-21 (×2): qty 1

## 2020-08-21 MED ORDER — ACETAMINOPHEN 10 MG/ML IV SOLN
1000.0000 mg | Freq: Four times a day (QID) | INTRAVENOUS | Status: DC
  Administered 2020-08-21: 1000 mg via INTRAVENOUS
  Filled 2020-08-21: qty 100

## 2020-08-21 MED ORDER — DEXAMETHASONE SODIUM PHOSPHATE 10 MG/ML IJ SOLN
8.0000 mg | Freq: Once | INTRAMUSCULAR | Status: AC
  Administered 2020-08-21: 8 mg via INTRAVENOUS

## 2020-08-21 MED ORDER — TRAMADOL HCL 50 MG PO TABS
50.0000 mg | ORAL_TABLET | Freq: Four times a day (QID) | ORAL | Status: DC | PRN
  Administered 2020-08-22: 50 mg via ORAL
  Filled 2020-08-21: qty 1

## 2020-08-21 MED ORDER — BUPIVACAINE LIPOSOME 1.3 % IJ SUSP
INTRAMUSCULAR | Status: DC | PRN
  Administered 2020-08-21: 20 mL

## 2020-08-21 MED ORDER — METHOCARBAMOL 500 MG IVPB - SIMPLE MED
500.0000 mg | Freq: Four times a day (QID) | INTRAVENOUS | Status: DC | PRN
  Filled 2020-08-21: qty 50

## 2020-08-21 MED ORDER — ORAL CARE MOUTH RINSE: 15.0000 mL | Freq: Once | OROMUCOSAL | Status: AC

## 2020-08-21 MED ORDER — METHOCARBAMOL 500 MG PO TABS
500.0000 mg | ORAL_TABLET | Freq: Four times a day (QID) | ORAL | Status: DC | PRN
  Administered 2020-08-21: 500 mg via ORAL
  Filled 2020-08-21: qty 1

## 2020-08-21 MED ORDER — SODIUM CHLORIDE (PF) 0.9 % IJ SOLN
INTRAMUSCULAR | Status: AC
  Filled 2020-08-21: qty 50

## 2020-08-21 MED ORDER — TRANEXAMIC ACID-NACL 1000-0.7 MG/100ML-% IV SOLN
1000.0000 mg | INTRAVENOUS | Status: AC
  Administered 2020-08-21: 1000 mg via INTRAVENOUS
  Filled 2020-08-21: qty 100

## 2020-08-21 MED ORDER — BUPIVACAINE IN DEXTROSE 0.75-8.25 % IT SOLN
INTRATHECAL | Status: DC | PRN
  Administered 2020-08-21: 1.6 mL via INTRATHECAL

## 2020-08-21 MED ORDER — PROPOFOL 1000 MG/100ML IV EMUL
INTRAVENOUS | Status: AC
  Filled 2020-08-21: qty 100

## 2020-08-21 MED ORDER — LACTATED RINGERS IV SOLN: INTRAVENOUS | Status: DC

## 2020-08-21 MED ORDER — MIDAZOLAM HCL 2 MG/2ML IJ SOLN: 1.0000 mg | INTRAMUSCULAR | Status: DC

## 2020-08-21 MED ORDER — OXYCODONE HCL 5 MG PO TABS
5.0000 mg | ORAL_TABLET | ORAL | Status: DC | PRN
  Administered 2020-08-21 (×2): 10 mg via ORAL
  Administered 2020-08-22: 5 mg via ORAL
  Filled 2020-08-21: qty 1
  Filled 2020-08-21 (×2): qty 2

## 2020-08-21 MED ORDER — POVIDONE-IODINE 10 % EX SWAB
2.0000 "application " | Freq: Once | CUTANEOUS | Status: AC
  Administered 2020-08-21: 2 via TOPICAL

## 2020-08-21 MED ORDER — FENTANYL CITRATE (PF) 100 MCG/2ML IJ SOLN: 50.0000 ug | INTRAMUSCULAR | Status: DC

## 2020-08-21 MED ORDER — SODIUM CHLORIDE 0.9 % IV SOLN: INTRAVENOUS | Status: DC

## 2020-08-21 MED ORDER — STERILE WATER FOR IRRIGATION IR SOLN
Status: DC | PRN
  Administered 2020-08-21: 2000 mL

## 2020-08-21 MED ORDER — OXYCODONE HCL 5 MG PO TABS: 5.0000 mg | ORAL_TABLET | Freq: Once | ORAL | Status: DC | PRN

## 2020-08-21 MED ORDER — DIPHENHYDRAMINE HCL 12.5 MG/5ML PO ELIX: 12.5000 mg | ORAL_SOLUTION | ORAL | Status: DC | PRN

## 2020-08-21 MED ORDER — DOCUSATE SODIUM 100 MG PO CAPS
100.0000 mg | ORAL_CAPSULE | Freq: Two times a day (BID) | ORAL | Status: DC
  Administered 2020-08-21 – 2020-08-23 (×3): 100 mg via ORAL
  Filled 2020-08-21 (×3): qty 1

## 2020-08-21 MED ORDER — CHLORHEXIDINE GLUCONATE 0.12 % MT SOLN
15.0000 mL | Freq: Once | OROMUCOSAL | Status: AC
  Administered 2020-08-21: 15 mL via OROMUCOSAL

## 2020-08-21 MED ORDER — PROPOFOL 500 MG/50ML IV EMUL
INTRAVENOUS | Status: DC | PRN
  Administered 2020-08-21: 125 ug/kg/min via INTRAVENOUS

## 2020-08-21 MED ORDER — FLUTICASONE PROPIONATE 50 MCG/ACT NA SUSP: 2.0000 | Freq: Every day | NASAL | Status: DC | PRN

## 2020-08-21 MED ORDER — VANCOMYCIN HCL IN DEXTROSE 1-5 GM/200ML-% IV SOLN
1000.0000 mg | INTRAVENOUS | Status: AC
  Administered 2020-08-21: 1000 mg via INTRAVENOUS
  Filled 2020-08-21: qty 200

## 2020-08-21 MED ORDER — OXYCODONE HCL 5 MG/5ML PO SOLN: 5.0000 mg | Freq: Once | ORAL | Status: DC | PRN

## 2020-08-21 MED ORDER — FLEET ENEMA 7-19 GM/118ML RE ENEM: 1.0000 | ENEMA | Freq: Once | RECTAL | Status: DC | PRN

## 2020-08-21 MED ORDER — 0.9 % SODIUM CHLORIDE (POUR BTL) OPTIME
TOPICAL | Status: DC | PRN
  Administered 2020-08-21: 1000 mL

## 2020-08-21 MED ORDER — VANCOMYCIN HCL IN DEXTROSE 1-5 GM/200ML-% IV SOLN
1000.0000 mg | Freq: Two times a day (BID) | INTRAVENOUS | Status: AC
  Administered 2020-08-21: 1000 mg via INTRAVENOUS
  Filled 2020-08-21: qty 200

## 2020-08-21 MED ORDER — BISACODYL 10 MG RE SUPP: 10.0000 mg | Freq: Every day | RECTAL | Status: DC | PRN

## 2020-08-21 MED ORDER — AMISULPRIDE (ANTIEMETIC) 5 MG/2ML IV SOLN: 10.0000 mg | Freq: Once | INTRAVENOUS | Status: DC | PRN

## 2020-08-21 MED ORDER — POLYETHYLENE GLYCOL 3350 17 G PO PACK: 17.0000 g | PACK | Freq: Every day | ORAL | Status: DC | PRN

## 2020-08-21 MED ORDER — PROPOFOL 10 MG/ML IV BOLUS
INTRAVENOUS | Status: AC
  Filled 2020-08-21: qty 20

## 2020-08-21 MED ORDER — SODIUM CHLORIDE 0.9 % IR SOLN
Status: DC | PRN
  Administered 2020-08-21: 1000 mL

## 2020-08-21 MED ORDER — PHENOL 1.4 % MT LIQD: 1.0000 | OROMUCOSAL | Status: DC | PRN

## 2020-08-21 MED ORDER — DEXAMETHASONE SODIUM PHOSPHATE 10 MG/ML IJ SOLN
INTRAMUSCULAR | Status: AC
  Filled 2020-08-21: qty 1

## 2020-08-21 MED ORDER — ONDANSETRON HCL 4 MG/2ML IJ SOLN
4.0000 mg | Freq: Four times a day (QID) | INTRAMUSCULAR | Status: DC | PRN
  Administered 2020-08-22: 4 mg via INTRAVENOUS
  Filled 2020-08-21: qty 2

## 2020-08-21 MED ORDER — ROPIVACAINE HCL 5 MG/ML IJ SOLN
INTRAMUSCULAR | Status: DC | PRN
  Administered 2020-08-21: 20 mL via PERINEURAL

## 2020-08-21 MED ORDER — HYDROMORPHONE HCL 1 MG/ML IJ SOLN: 0.2500 mg | INTRAMUSCULAR | Status: DC | PRN

## 2020-08-21 MED ORDER — PROMETHAZINE HCL 25 MG/ML IJ SOLN: 6.2500 mg | INTRAMUSCULAR | Status: DC | PRN

## 2020-08-21 MED ORDER — ONDANSETRON HCL 4 MG/2ML IJ SOLN
INTRAMUSCULAR | Status: AC
  Filled 2020-08-21: qty 2

## 2020-08-21 MED ORDER — ONDANSETRON HCL 4 MG PO TABS: 4.0000 mg | ORAL_TABLET | Freq: Four times a day (QID) | ORAL | Status: DC | PRN

## 2020-08-21 MED ORDER — SODIUM CHLORIDE (PF) 0.9 % IJ SOLN
INTRAMUSCULAR | Status: AC
  Filled 2020-08-21: qty 10

## 2020-08-21 MED ORDER — FENTANYL CITRATE (PF) 100 MCG/2ML IJ SOLN
INTRAMUSCULAR | Status: AC
  Administered 2020-08-21: 50 ug
  Filled 2020-08-21: qty 2

## 2020-08-21 SURGICAL SUPPLY — 56 items
ATTUNE PS FEM LT SZ 4 CEM KNEE (Femur) ×1 IMPLANT
ATTUNE PSRP INSR SZ4 8 KNEE (Insert) ×1 IMPLANT
BAG SPEC THK2 15X12 ZIP CLS (MISCELLANEOUS) ×1
BAG ZIPLOCK 12X15 (MISCELLANEOUS) ×2 IMPLANT
BASE TIBIAL ROT PLAT SZ 3 KNEE (Knees) IMPLANT
BLADE SAG 18X100X1.27 (BLADE) ×2 IMPLANT
BLADE SAW SGTL 11.0X1.19X90.0M (BLADE) ×2 IMPLANT
BLADE SURG SZ10 CARB STEEL (BLADE) ×4 IMPLANT
BNDG ELASTIC 6X5.8 VLCR STR LF (GAUZE/BANDAGES/DRESSINGS) ×2 IMPLANT
BOWL SMART MIX CTS (DISPOSABLE) ×2 IMPLANT
BSPLAT TIB 3 CMNT ROT PLAT STR (Knees) ×1 IMPLANT
CEMENT HV SMART SET (Cement) ×3 IMPLANT
COVER SURGICAL LIGHT HANDLE (MISCELLANEOUS) ×2 IMPLANT
COVER WAND RF STERILE (DRAPES) IMPLANT
CUFF TOURN SGL QUICK 34 (TOURNIQUET CUFF) ×2
CUFF TRNQT CYL 34X4.125X (TOURNIQUET CUFF) ×1 IMPLANT
DECANTER SPIKE VIAL GLASS SM (MISCELLANEOUS) ×2 IMPLANT
DRAPE U-SHAPE 47X51 STRL (DRAPES) ×2 IMPLANT
DRESSING AQUACEL AG SP 3.5X10 (GAUZE/BANDAGES/DRESSINGS) IMPLANT
DRSG AQUACEL AG ADV 3.5X10 (GAUZE/BANDAGES/DRESSINGS) ×2 IMPLANT
DRSG AQUACEL AG SP 3.5X10 (GAUZE/BANDAGES/DRESSINGS) ×2
DURAPREP 26ML APPLICATOR (WOUND CARE) ×2 IMPLANT
ELECT REM PT RETURN 15FT ADLT (MISCELLANEOUS) ×2 IMPLANT
GLOVE BIO SURGEON STRL SZ7 (GLOVE) ×2 IMPLANT
GLOVE BIO SURGEON STRL SZ8 (GLOVE) ×2 IMPLANT
GLOVE BIOGEL PI IND STRL 7.0 (GLOVE) ×1 IMPLANT
GLOVE BIOGEL PI IND STRL 8 (GLOVE) ×1 IMPLANT
GLOVE BIOGEL PI INDICATOR 7.0 (GLOVE) ×1
GLOVE BIOGEL PI INDICATOR 8 (GLOVE) ×1
GOWN STRL REUS W/TWL LRG LVL3 (GOWN DISPOSABLE) ×4 IMPLANT
HANDPIECE INTERPULSE COAX TIP (DISPOSABLE) ×2
HOLDER FOLEY CATH W/STRAP (MISCELLANEOUS) ×1 IMPLANT
IMMOBILIZER KNEE 20 (SOFTGOODS) ×2
IMMOBILIZER KNEE 20 THIGH 36 (SOFTGOODS) ×1 IMPLANT
KIT TURNOVER KIT A (KITS) IMPLANT
MANIFOLD NEPTUNE II (INSTRUMENTS) ×2 IMPLANT
NS IRRIG 1000ML POUR BTL (IV SOLUTION) ×2 IMPLANT
PACK TOTAL KNEE CUSTOM (KITS) ×2 IMPLANT
PADDING CAST COTTON 6X4 STRL (CAST SUPPLIES) ×3 IMPLANT
PATELLA MEDIAL ATTUN 35MM KNEE (Knees) ×1 IMPLANT
PENCIL SMOKE EVACUATOR (MISCELLANEOUS) ×2 IMPLANT
PIN DRILL FIX HALF THREAD (BIT) ×1 IMPLANT
PIN STEINMAN FIXATION KNEE (PIN) ×1 IMPLANT
PROTECTOR NERVE ULNAR (MISCELLANEOUS) ×2 IMPLANT
SET HNDPC FAN SPRY TIP SCT (DISPOSABLE) ×1 IMPLANT
STRIP CLOSURE SKIN 1/2X4 (GAUZE/BANDAGES/DRESSINGS) ×4 IMPLANT
SUT MNCRL AB 4-0 PS2 18 (SUTURE) ×2 IMPLANT
SUT STRATAFIX 0 PDS 27 VIOLET (SUTURE) ×2
SUT VIC AB 2-0 CT1 27 (SUTURE) ×6
SUT VIC AB 2-0 CT1 TAPERPNT 27 (SUTURE) ×3 IMPLANT
SUTURE STRATFX 0 PDS 27 VIOLET (SUTURE) ×1 IMPLANT
SYR BULB IRRIG 60ML STRL (SYRINGE) ×1 IMPLANT
TIBIAL BASE ROT PLAT SZ 3 KNEE (Knees) ×2 IMPLANT
TRAY FOLEY MTR SLVR 14FR STAT (SET/KITS/TRAYS/PACK) ×1 IMPLANT
WATER STERILE IRR 1000ML POUR (IV SOLUTION) ×4 IMPLANT
WRAP KNEE MAXI GEL POST OP (GAUZE/BANDAGES/DRESSINGS) ×2 IMPLANT

## 2020-08-21 NOTE — Evaluation (Signed)
Physical Therapy Evaluation Patient Details Name: Lisa [name] MRN: [id number] DOB: August 16, 1941 Today's Date: 08/21/2020   History of Present Illness  Patient is 79 y.o. female s/p Lt TKA on 08/21/20 with PMH significant for osteopenia, obesity, HTN, HLD, cervical cancer, OA.    Clinical Impression  ADRIENE KNIPFER is a 79 y.o. female POD 0 s/p Lt TKA. Patient reports modified independence with SPC for mobility at baseline. Patient is now limited by functional impairments (see PT problem list below) and requires min-mod assist for transfers with RW. Patient was able take several small steps to complete stand step transfer to recliner with RW and mod assist. Patient instructed in exercise to facilitate ROM and circulation. Patient will benefit from continued skilled PT interventions to address impairments and progress towards PLOF. Acute PT will follow to progress mobility and stair training in preparation for safe discharge home.     Follow Up Recommendations Follow surgeon's recommendation for DC plan and follow-up therapies;Outpatient PT    Equipment Recommendations  Rolling walker with 5" wheels;3in1 (PT)    Recommendations for Other Services       Precautions / Restrictions Precautions Precautions: Fall Restrictions Weight Bearing Restrictions: No Other Position/Activity Restrictions: WBAT      Mobility  Bed Mobility Overal bed mobility: Needs Assistance Bed Mobility: Supine to Sit     Supine to sit: Min assist;HOB elevated     General bed mobility comments: cues for use of bed rail and assist to raise trunk and bring LE's off EOB  Transfers Overall transfer level: Needs assistance Equipment used: Rolling walker (2 wheeled) Transfers: Sit to/from [facility] Sit to Stand: Min assist;Mod assist Stand pivot transfers: Min assist;Mod assist       General transfer comment: cues for technique with RW, min-mod assist for power up and to steady with  rising. sit<>stand x2 from EOB and stand step to chair with mod assist to manage walker position and cues for step sequencing.  Ambulation/Gait       Stairs     Wheelchair Mobility    Modified Rankin (Stroke Patients Only)       Balance Overall balance assessment: Needs assistance Sitting-balance support: Feet supported Sitting balance-Leahy Scale: Fair     Standing balance support: During functional activity;Bilateral upper extremity supported Standing balance-Leahy Scale: Poor                  Pertinent Vitals/Pain Pain Assessment: 0-10 Pain Score: 4  Pain Location: Lt knee Pain Descriptors / Indicators: Aching;Discomfort Pain Intervention(s): Limited activity within patient's tolerance;Monitored during session;Repositioned;Premedicated before session;Ice applied    Home Living Family/patient expects to be discharged to:: Private residence Living Arrangements: Alone Available Help at Discharge: Family Type of Home: Apartment Home Access: Elevator     Home Layout: One level Home Equipment: Education officer, community - 4 wheels Additional Comments: pt is staying at sisters apt. Pt/sister reoprts it is a long walk from elevator to the apt. however they have a transport chair for that.    Prior Function Level of Independence: Independent with assistive device(s)         Comments: using SPC for 4+ yrs     Hand Dominance   Dominant Hand: Right    Extremity/Trunk Assessment   Upper Extremity Assessment Upper Extremity Assessment: Overall WFL for tasks assessed    Lower Extremity Assessment Lower Extremity Assessment: LLE deficits/detail LLE Deficits / Details: good quad activation, no extensor lag with SLR LLE Sensation: WNL LLE  Coordination: WNL    Cervical / Trunk Assessment Cervical / Trunk Assessment: Lordotic  Communication   Communication: No difficulties  Cognition Arousal/Alertness: Awake/alert Behavior During Therapy: WFL  for tasks assessed/performed Overall Cognitive Status: Within Functional Limits for tasks assessed                     General Comments      Exercises Total Joint Exercises Ankle Circles/Pumps: AROM;Both;20 reps;Seated Quad Sets: AROM;Left;Seated;10 reps   Assessment/Plan    PT Assessment Patient needs continued PT services  PT Problem List Decreased strength;Decreased range of motion;Decreased activity tolerance;Decreased balance;Decreased mobility;Decreased knowledge of use of DME;Decreased knowledge of precautions;Pain       PT Treatment Interventions DME instruction;Gait training;Stair training;Functional mobility training;Therapeutic activities;Therapeutic exercise;Balance training;Patient/family education    PT Goals (Current goals can be found in the Care Plan section)  Acute Rehab PT Goals Patient Stated Goal: be able to walk without cane PT Goal Formulation: With patient Time For Goal Achievement: 08/28/20 Potential to Achieve Goals: Good    Frequency 7X/week   Barriers to discharge        AM-PAC PT "6 Clicks" Mobility  Outcome Measure Help needed turning from your back to your side while in a flat bed without using bedrails?: A Little Help needed moving from lying on your back to sitting on the side of a flat bed without using bedrails?: A Little Help needed moving to and from a bed to a chair (including a wheelchair)?: A Lot Help needed standing up from a chair using your arms (e.g., wheelchair or bedside chair)?: A Lot Help needed to walk in hospital room?: A Lot Help needed climbing 3-5 steps with a railing? : A Lot 6 Click Score: 14    End of Session Equipment Utilized During Treatment: Gait belt Activity Tolerance: Patient tolerated treatment well Patient left: in chair;with call bell/phone within reach;with chair alarm set;with family/visitor present Nurse Communication: Mobility status PT Visit Diagnosis: Muscle weakness (generalized)  (M62.81);Difficulty in walking, not elsewhere classified (R26.2)    Time: 7544-9201 PT Time Calculation (min) (ACUTE ONLY): 28 min   Charges:   PT Evaluation $PT Eval Low Complexity: 1 Low PT Treatments $Therapeutic Activity: 8-22 mins        Verner Mould, DPT Acute Rehabilitation Services  Office [phone] Pager [phone]  08/21/2020 5:24 PM

## 2020-08-21 NOTE — Anesthesia Preprocedure Evaluation (Signed)
Anesthesia Evaluation    Airway Mallampati: II  TM Distance: >3 FB Neck ROM: Full    Dental no notable dental hx.    Pulmonary    Pulmonary exam normal breath sounds clear to auscultation       Cardiovascular hypertension, Pt. on medications Normal cardiovascular exam Rhythm:Regular Rate:Normal     Neuro/Psych  Headaches, Anxiety    GI/Hepatic   Endo/Other    Renal/GU      Musculoskeletal  (+) Arthritis , Osteoarthritis,    Abdominal (+) + obese,   Peds  Hematology   Anesthesia Other Findings   Reproductive/Obstetrics                             Anesthesia Physical Anesthesia Plan  ASA: II  Anesthesia Plan: Spinal   Post-op Pain Management:  Regional for Post-op pain   Induction: Intravenous  PONV Risk Score and Plan: 2 and Ondansetron, Midazolam and Treatment may vary due to age or medical condition  Airway Management Planned: Simple Face Mask  Additional Equipment:   Intra-op Plan:   Post-operative Plan:   Informed Consent: I have reviewed the patients History and Physical, chart, labs and discussed the procedure including the risks, benefits and alternatives for the proposed anesthesia with the patient or authorized representative who has indicated his/her understanding and acceptance.     Dental advisory given  Plan Discussed with: CRNA  Anesthesia Plan Comments:         Anesthesia Quick Evaluation

## 2020-08-21 NOTE — Anesthesia Postprocedure Evaluation (Signed)
Anesthesia Post Note  Patient: Lisa [name]  Procedure(s) Performed: TOTAL KNEE ARTHROPLASTY (Left Knee)     Patient location during evaluation: PACU Anesthesia Type: Spinal Level of consciousness: awake and alert Pain management: pain level controlled Vital Signs Assessment: post-procedure vital signs reviewed and stable Respiratory status: spontaneous breathing, nonlabored ventilation and respiratory function stable Cardiovascular status: blood pressure returned to baseline and stable Postop Assessment: no apparent nausea or vomiting Anesthetic complications: no   No complications documented.  Last Vitals:  Vitals:   08/21/20 1200 08/21/20 1213  BP: (!) 145/76 130/83  Pulse: 82 78  Resp: 20 18  Temp: 36.6 C 36.5 C  SpO2: 100% 100%    Last Pain:  Vitals:   08/21/20 1145  TempSrc:   PainSc: 0-No pain                 Lynda Rainwater

## 2020-08-21 NOTE — Discharge Instructions (Addendum)
 Lisa Aluisio, MD Total Joint Specialist [facility] [address] [place], [place] [address] [phone]  TOTAL KNEE REPLACEMENT POSTOPERATIVE DIRECTIONS    Knee Rehabilitation, Guidelines Following Surgery  Results after knee surgery are often greatly improved when you follow the exercise, range of motion and muscle strengthening exercises prescribed by your doctor. Safety measures are also important to protect the knee from further injury. If any of these exercises cause you to have increased pain or swelling in your knee joint, decrease the amount until you are comfortable again and slowly increase them. If you have problems or questions, call your caregiver or physical therapist for advice.   BLOOD CLOT PREVENTION . Take a 325 mg Aspirin two times a day for three weeks following surgery. Then take an 81 mg Aspirin once a day for three weeks. Then discontinue Aspirin. . You may resume your vitamins/supplements upon discharge from the hospital. . Do not take any NSAIDs (Advil, Aleve, Ibuprofen, Meloxicam, etc.) until you have discontinued the 325 mg Aspirin.  HOME CARE INSTRUCTIONS  . Remove items at home which could result in a fall. This includes throw rugs or furniture in walking pathways.  . ICE to the affected knee as much as tolerated. Icing helps control swelling. If the swelling is well controlled you will be more comfortable and rehab easier. Continue to use ice on the knee for pain and swelling from surgery. You may notice swelling that will progress down to the foot and ankle. This is normal after surgery. Elevate the leg when you are not up walking on it.    . Continue to use the breathing machine which will help keep your temperature down. It is common for your temperature to cycle up and down following surgery, especially at night when you are not up moving around and exerting yourself. The breathing machine keeps your lungs expanded and your  temperature down. . Do not place pillow under the operative knee, focus on keeping the knee straight while resting  DIET You may resume your previous home diet once you are discharged from the hospital.  DRESSING / WOUND CARE / SHOWERING . Keep your bulky bandage on for 2 days. On the third post-operative day you may remove the Ace bandage and gauze. There is a waterproof adhesive bandage on your skin which will stay in place until your first follow-up appointment. Once you remove this you will not need to place another bandage . You may begin showering 3 days following surgery, but do not submerge the incision under water.  ACTIVITY For the first 5 days, the key is rest and control of pain and swelling . Do your home exercises twice a day starting on post-operative day 3. On the days you go to physical therapy, just do the home exercises once that day. . You should rest, ice and elevate the leg for 50 minutes out of every hour. Get up and walk/stretch for 10 minutes per hour. After 5 days you can increase your activity slowly as tolerated. . Walk with your walker as instructed. Use the walker until you are comfortable transitioning to a cane. Walk with the cane in the opposite hand of the operative leg. You may discontinue the cane once you are comfortable and walking steadily. . Avoid periods of inactivity such as sitting longer than an hour when not asleep. This helps prevent blood clots.  . You may discontinue the knee immobilizer once you are able to perform a straight   leg raise while lying down. . You may resume a sexual relationship in one month or when given the OK by your doctor.  . You may return to work once you are cleared by your doctor.  . Do not drive a car for 6 weeks or until released by your surgeon.  . Do not drive while taking narcotics.  TED HOSE STOCKINGS Wear the elastic stockings on both legs for three weeks following surgery during the day. You may remove them at night  for sleeping.  WEIGHT BEARING Weight bearing as tolerated with assist device (walker, cane, etc) as directed, use it as long as suggested by your surgeon or therapist, typically at least 4-6 weeks.  POSTOPERATIVE CONSTIPATION PROTOCOL Constipation - defined medically as fewer than three stools per week and severe constipation as less than one stool per week.  One of the most common issues patients have following surgery is constipation.  Even if you have a regular bowel pattern at home, your normal regimen is likely to be disrupted due to multiple reasons following surgery.  Combination of anesthesia, postoperative narcotics, change in appetite and fluid intake all can affect your bowels.  In order to avoid complications following surgery, here are some recommendations in order to help you during your recovery period.  . Colace (docusate) - Pick up an over-the-counter form of Colace or another stool softener and take twice a day as long as you are requiring postoperative pain medications.  Take with a full glass of water daily.  If you experience loose stools or diarrhea, hold the colace until you stool forms back up. If your symptoms do not get better within 1 week or if they get worse, check with your doctor. . Dulcolax (bisacodyl) - Pick up over-the-counter and take as directed by the product packaging as needed to assist with the movement of your bowels.  Take with a full glass of water.  Use this product as needed if not relieved by Colace only.  . MiraLax (polyethylene glycol) - Pick up over-the-counter to have on hand. MiraLax is a solution that will increase the amount of water in your bowels to assist with bowel movements.  Take as directed and can mix with a glass of water, juice, soda, coffee, or tea. Take if you go more than two days without a movement. Do not use MiraLax more than once per day. Call your doctor if you are still constipated or irregular after using this medication for 7 days  in a row.  If you continue to have problems with postoperative constipation, please contact the office for further assistance and recommendations.  If you experience "the worst abdominal pain ever" or develop nausea or vomiting, please contact the office immediatly for further recommendations for treatment.  ITCHING If you experience itching with your medications, try taking only a single pain pill, or even half a pain pill at a time.  You can also use Benadryl over the counter for itching or also to help with sleep.   MEDICATIONS See your medication summary on the "After Visit Summary" that the nursing staff will review with you prior to discharge.  You may have some home medications which will be placed on hold until you complete the course of blood thinner medication.  It is important for you to complete the blood thinner medication as prescribed by your surgeon.  Continue your approved medications as instructed at time of discharge.  PRECAUTIONS . If you experience chest pain or shortness of   breath - call 911 immediately for transfer to the hospital emergency department.  . If you develop a fever greater that 101 F, purulent drainage from wound, increased redness or drainage from wound, foul odor from the wound/dressing, or calf pain - CONTACT YOUR SURGEON.                                                   FOLLOW-UP APPOINTMENTS Make sure you keep all of your appointments after your operation with your surgeon and caregivers. You should call the office at the above phone number and make an appointment for approximately two weeks after the date of your surgery or on the date instructed by your surgeon outlined in the "After Visit Summary".  RANGE OF MOTION AND STRENGTHENING EXERCISES  Rehabilitation of the knee is important following a knee injury or an operation. After just a few days of immobilization, the muscles of the thigh which control the knee become weakened and shrink (atrophy). Knee  exercises are designed to build up the tone and strength of the thigh muscles and to improve knee motion. Often times heat used for twenty to thirty minutes before working out will loosen up your tissues and help with improving the range of motion but do not use heat for the first two weeks following surgery. These exercises can be done on a training (exercise) mat, on the floor, on a table or on a bed. Use what ever works the best and is most comfortable for you Knee exercises include:  . Leg Lifts - While your knee is still immobilized in a splint or cast, you can do straight leg raises. Lift the leg to 60 degrees, hold for 3 sec, and slowly lower the leg. Repeat 10-20 times 2-3 times daily. Perform this exercise against resistance later as your knee gets better.  . Quad and Hamstring Sets - Tighten up the muscle on the front of the thigh (Quad) and hold for 5-10 sec. Repeat this 10-20 times hourly. Hamstring sets are done by pushing the foot backward against an object and holding for 5-10 sec. Repeat as with quad sets.   Leg Slides: Lying on your back, slowly slide your foot toward your buttocks, bending your knee up off the floor (only go as far as is comfortable). Then slowly slide your foot back down until your leg is flat on the floor again.  Angel Wings: Lying on your back spread your legs to the side as far apart as you can without causing discomfort.  A rehabilitation program following serious knee injuries can speed recovery and prevent re-injury in the future due to weakened muscles. Contact your doctor or a physical therapist for more information on knee rehabilitation.   IF YOU ARE TRANSFERRED TO A SKILLED REHAB FACILITY If the patient is transferred to a skilled rehab facility following release from the hospital, a list of the current medications will be sent to the facility for the patient to continue.  When discharged from the skilled rehab facility, please have the facility set up the  patient's Home Health Physical Therapy prior to being released. Also, the skilled facility will be responsible for providing the patient with their medications at time of release from the facility to include their pain medication, the muscle relaxants, and their blood thinner medication. If the patient is still at the   rehab facility at time of the two week follow up appointment, the skilled rehab facility will also need to assist the patient in arranging follow up appointment in our office and any transportation needs.  MAKE SURE YOU:  . Understand these instructions.  . Get help right away if you are not doing well or get worse.   DENTAL ANTIBIOTICS:  In most cases prophylactic antibiotics for Dental procdeures after total joint surgery are not necessary.  Exceptions are as follows:  1. History of prior total joint infection  2. Severely immunocompromised (Organ Transplant, cancer chemotherapy, Rheumatoid biologic meds such as Humera)  3. Poorly controlled diabetes (A1C &gt; 8.0, blood glucose over 200)  If you have one of these conditions, contact your surgeon for an antibiotic prescription, prior to your dental procedure.    Pick up stool softner and laxative for home use following surgery while on pain medications. Do not submerge incision under water. Please use good hand washing techniques while changing dressing each day. May shower starting three days after surgery. Please use a clean towel to pat the incision dry following showers. Continue to use ice for pain and swelling after surgery. Do not use any lotions or creams on the incision until instructed by your surgeon.  

## 2020-08-21 NOTE — Progress Notes (Signed)
Orthopedic Tech Progress Note Patient Details:  Lisa [name] 1941/08/12 [id number]  CPM Left Knee CPM Left Knee: On Left Knee Flexion (Degrees): 40 Left Knee Extension (Degrees): 10 Additional Comments: on cpm at 11:15  Post Interventions Patient Tolerated: Well Instructions Provided: Care of device  Braulio Bosch 08/21/2020, 12:14 PM

## 2020-08-21 NOTE — Progress Notes (Signed)
Orthopedic Tech Progress Note Patient Details:  Lisa [name] [birth year] [id number] Applied ofh to bed Ortho Devices Type of Ortho Device: CPM padding Ortho Device/Splint Interventions: Ordered, Application, Adjustment   Post Interventions Patient Tolerated: Well Instructions Provided: Care of device   Braulio Bosch 08/21/2020, 12:13 PM

## 2020-08-21 NOTE — Care Plan (Signed)
Ortho Bundle Case Management Note  Patient Details  Name: Lisa [name] MRN: [id number] Date of Birth: 1941-09-18  L TKA on 08-21-20 DCP:  Home with sister.  Apt with an elevator. DME:  RW and 3-in-1 ordered through [facility] PT:  [facility].  PT eval scheduled on 08-24-20 at 2:00 pm.                   DME Arranged:  Gilford Rile rolling, 3-N-1 DME Agency:  [employer]  HH Arranged:  NA HH Agency:  NA  Additional Comments: Please contact me with any questions of if this plan should need to change.  Marianne Sofia, RN,CCM [facility]  [phone] 08/21/2020, 4:16 PM

## 2020-08-21 NOTE — Transfer of Care (Signed)
Immediate Anesthesia Transfer of Care Note  Patient: Lisa [name]  Procedure(s) Performed: TOTAL KNEE ARTHROPLASTY (Left Knee)  Patient Location: PACU  Anesthesia Type:Spinal and MAC combined with regional for post-op pain  Level of Consciousness: awake, oriented, patient cooperative and responds to stimulation  Airway & Oxygen Therapy: Patient Spontanous Breathing and Patient connected to face mask oxygen  Post-op Assessment: Report given to RN and Post -op Vital signs reviewed and stable  Post vital signs: Reviewed and stable  Last Vitals:  Vitals Value Taken Time  BP 126/65 08/21/20 1048  Temp    Pulse 82 08/21/20 1049  Resp 20 08/21/20 1049  SpO2 100 % 08/21/20 1049  Vitals shown include unvalidated device data.  Last Pain:  Vitals:   08/21/20 0845  TempSrc:   PainSc: 0-No pain      Patients Stated Pain Goal: 5 (92/92/44 6286)  Complications: No complications documented.

## 2020-08-21 NOTE — Op Note (Signed)
OPERATIVE REPORT-TOTAL KNEE ARTHROPLASTY   Pre-operative diagnosis- Osteoarthritis  Left knee(s)  Post-operative diagnosis- Osteoarthritis Left knee(s)  Procedure-  Left  Total Knee Arthroplasty  Surgeon- Dione Plover. Haynes Giannotti, MD  Assistant- Molli Barrows, PA-C   Anesthesia-  Adductor canal block and spinal  EBL-50 mL   Drains None  Tourniquet time-  Total Tourniquet Time Documented: Thigh (Left) - 30 minutes Total: Thigh (Left) - 30 minutes     Complications- None  Condition-PACU - hemodynamically stable.   Brief Clinical Note  Lisa [name] is a 79 y.o. year old female with end stage OA of her left knee with progressively worsening pain and dysfunction. She has constant pain, with activity and at rest and significant functional deficits with difficulties even with ADLs. She has had extensive non-op management including analgesics, injections of cortisone and viscosupplements, and home exercise program, but remains in significant pain with significant dysfunction. Radiographs show bone on bone arthritis lateral and patellofemoral. She presents now for left Total Knee Arthroplasty.    Procedure in detail---   The patient is brought into the operating room and positioned supine on the operating table. After successful administration of  Adductor canal block and spinal,   a tourniquet is placed high on the  Left thigh(s) and the lower extremity is prepped and draped in the usual sterile fashion. Time out is performed by the operating team and then the  Left lower extremity is wrapped in Esmarch, knee flexed and the tourniquet inflated to 300 mmHg.       A midline incision is made with a ten blade through the subcutaneous tissue to the level of the extensor mechanism. A fresh blade is used to make a medial parapatellar arthrotomy. Soft tissue over the proximal medial tibia is subperiosteally elevated to the joint line with a knife and into the semimembranosus bursa with a Cobb  elevator. Soft tissue over the proximal lateral tibia is elevated with attention being paid to avoiding the patellar tendon on the tibial tubercle. The patella is everted, knee flexed 90 degrees and the ACL and PCL are removed. Findings are bone on bone lateral and patellofemoral with large global osteophytes        The drill is used to create a starting hole in the distal femur and the canal is thoroughly irrigated with sterile saline to remove the fatty contents. The 5 degree Left  valgus alignment guide is placed into the femoral canal and the distal femoral cutting block is pinned to remove 9 mm off the distal femur. Resection is made with an oscillating saw.      The tibia is subluxed forward and the menisci are removed. The extramedullary alignment guide is placed referencing proximally at the medial aspect of the tibial tubercle and distally along the second metatarsal axis and tibial crest. The block is pinned to remove 87mm off the more deficient lateral  side. Resection is made with an oscillating saw. Size 3is the most appropriate size for the tibia and the proximal tibia is prepared with the modular drill and keel punch for that size.      The femoral sizing guide is placed and size 4 is most appropriate. Rotation is marked off the epicondylar axis and confirmed by creating a rectangular flexion gap at 90 degrees. The size 4 cutting block is pinned in this rotation and the anterior, posterior and chamfer cuts are made with the oscillating saw. The intercondylar block is then placed and that cut is made.  Trial size 3 tibial component, trial size 4 posterior stabilized femur and a 8  mm posterior stabilized rotating platform insert trial is placed. Full extension is achieved with excellent varus/valgus and anterior/posterior balance throughout full range of motion. The patella is everted and thickness measured to be 21  mm. Free hand resection is taken to 12 mm, a 35 template is placed, lug holes  are drilled, trial patella is placed, and it tracks normally. Osteophytes are removed off the posterior femur with the trial in place. All trials are removed and the cut bone surfaces prepared with pulsatile lavage. Cement is mixed and once ready for implantation, the size 3 tibial implant, size  4 posterior stabilized femoral component, and the size 35 patella are cemented in place and the patella is held with the clamp. The trial insert is placed and the knee held in full extension. The Exparel (20 ml mixed with 60 ml saline) is injected into the extensor mechanism, posterior capsule, medial and lateral gutters and subcutaneous tissues.  All extruded cement is removed and once the cement is hard the permanent 8 mm posterior stabilized rotating platform insert is placed into the tibial tray.      The wound is copiously irrigated with saline solution and the extensor mechanism closed with # 0 Stratofix suture. The tourniquet is released for a total tourniquet time of 30  minutes. Flexion against gravity is 140 degrees and the patella tracks normally. Subcutaneous tissue is closed with 2.0 vicryl and subcuticular with running 4.0 Monocryl. The incision is cleaned and dried and steri-strips and a bulky sterile dressing are applied. The limb is placed into a knee immobilizer and the patient is awakened and transported to recovery in stable condition.      Please note that a surgical assistant was a medical necessity for this procedure in order to perform it in a safe and expeditious manner. Surgical assistant was necessary to retract the ligaments and vital neurovascular structures to prevent injury to them and also necessary for proper positioning of the limb to allow for anatomic placement of the prosthesis.   Dione Plover Rafiel Mecca, MD    08/21/2020, 10:25 AM

## 2020-08-21 NOTE — Progress Notes (Signed)
Orthopedic Tech Progress Note Patient Details:  Lisa [name] 1941-10-24 [id number]  CPM Left Knee CPM Left Knee: On Left Knee Flexion (Degrees): 40 Left Knee Extension (Degrees): 10 Additional Comments: off cpm at 3:15  Post Interventions Patient Tolerated: Well Instructions Provided: Care of device  Braulio Bosch 08/21/2020, 3:16 PM

## 2020-08-21 NOTE — Interval H&P Note (Signed)
History and Physical Interval Note:  08/21/2020 8:18 AM  Lisa [name]  has presented today for surgery, with the diagnosis of left knee osteoarthritis.  The various methods of treatment have been discussed with the patient and family. After consideration of risks, benefits and other options for treatment, the patient has consented to  Procedure(s) with comments: TOTAL KNEE ARTHROPLASTY (Left) - 68min as a surgical intervention.  The patient's history has been reviewed, patient examined, no change in status, stable for surgery.  I have reviewed the patient's chart and labs.  Questions were answered to the patient's satisfaction.     Pilar Plate Eris Breck

## 2020-08-21 NOTE — Anesthesia Procedure Notes (Signed)
Spinal  Patient location during procedure: OR Start time: 08/21/2020 9:23 AM End time: 08/21/2020 9:26 AM Staffing Performed: resident/CRNA  Resident/CRNA: Niel Hummer, CRNA Preanesthetic Checklist Completed: patient identified, IV checked, risks and benefits discussed, surgical consent, monitors and equipment checked and pre-op evaluation Spinal Block Prep: DuraPrep Patient monitoring: heart rate, continuous pulse ox and blood pressure Approach: midline Location: L3-4 Injection technique: single-shot Needle Needle type: Pencan  Needle gauge: 24 G Needle length: 10 cm

## 2020-08-21 NOTE — Progress Notes (Signed)
Assisted Dr. Miller with left, ultrasound guided, adductor canal block. Side rails up, monitors on throughout procedure. See vital signs in flow sheet. Tolerated Procedure well.  

## 2020-08-21 NOTE — Progress Notes (Signed)
Orthopedic Tech Progress Note Patient Details:  Lisa [name] 08-Feb-1941 [id number]  CPM Left Knee CPM Left Knee: On Left Knee Flexion (Degrees): 40 Left Knee Extension (Degrees): 10  Post Interventions Patient Tolerated: Well Instructions Provided: Care of device Ortho Devices Type of Ortho Device: CPM padding Ortho Device/Splint Interventions: Ordered, Application, Adjustment   Post Interventions Patient Tolerated: Well Instructions Provided: Care of device   Braulio Bosch 08/21/2020, 11:18 AM

## 2020-08-21 NOTE — Anesthesia Procedure Notes (Signed)
Procedure Name: MAC Date/Time: 08/21/2020 9:21 AM Performed by: Niel Hummer, CRNA Pre-anesthesia Checklist: Patient identified, Emergency Drugs available, Suction available and Patient being monitored Oxygen Delivery Method: Simple face mask

## 2020-08-21 NOTE — Anesthesia Procedure Notes (Signed)
Anesthesia Regional Block: Adductor canal block   Pre-Anesthetic Checklist: ,, timeout performed, Correct Patient, Correct Site, Correct Laterality, Correct Procedure, Correct Position, site marked, Risks and benefits discussed,  Surgical consent,  Pre-op evaluation,  At surgeon's request and post-op pain management  Laterality: Left  Prep: chloraprep       Needles:  Injection technique: Single-shot  Needle Type: Stimiplex     Needle Length: 9cm  Needle Gauge: 21     Additional Needles:   Procedures:,,,, ultrasound used (permanent image in chart),,,,  Narrative:  Start time: 08/21/2020 8:22 AM End time: 08/21/2020 8:27 AM Injection made incrementally with aspirations every 5 mL.  Performed by: Personally  Anesthesiologist: Lynda Rainwater, MD

## 2020-08-22 ENCOUNTER — Encounter (HOSPITAL_COMMUNITY): Payer: Self-pay | Admitting: Orthopedic Surgery

## 2020-08-22 LAB — BASIC METABOLIC PANEL
Anion gap: 10 (ref 5–15)
BUN: 26 mg/dL — ABNORMAL HIGH (ref 8–23)
CO2: 26 mmol/L (ref 22–32)
Calcium: 8.6 mg/dL — ABNORMAL LOW (ref 8.9–10.3)
Chloride: 102 mmol/L (ref 98–111)
Creatinine, Ser: 0.93 mg/dL (ref 0.44–1.00)
GFR calc Af Amer: >60 mL/min (ref >60)
GFR calc non Af Amer: 60 mL/min — ABNORMAL LOW (ref >60)
Glucose, Bld: 143 mg/dL — ABNORMAL HIGH (ref 70–99)
Potassium: 3.5 mmol/L (ref 3.5–5.1)
Sodium: 138 mmol/L (ref 135–145)

## 2020-08-22 LAB — CBC
HCT: 32.3 % — ABNORMAL LOW (ref 36.0–46.0)
Hemoglobin: 10.3 g/dL — ABNORMAL LOW (ref 12.0–15.0)
MCH: 26.3 pg (ref 26.0–34.0)
MCHC: 31.9 g/dL (ref 30.0–36.0)
MCV: 82.6 fL (ref 80.0–100.0)
Platelets: 210 10*3/uL (ref 150–400)
RBC: 3.91 MIL/uL (ref 3.87–5.11)
RDW: 16.5 % — ABNORMAL HIGH (ref 11.5–15.5)
WBC: 11.4 10*3/uL — ABNORMAL HIGH (ref 4.0–10.5)
nRBC: 0 % (ref 0.0–0.2)

## 2020-08-22 MED ORDER — OXYCODONE HCL 5 MG PO TABS: 5.0000 mg | ORAL_TABLET | Freq: Four times a day (QID) | ORAL | 0 refills | Status: DC | PRN

## 2020-08-22 MED ORDER — TRAMADOL HCL 50 MG PO TABS: 50.0000 mg | ORAL_TABLET | Freq: Four times a day (QID) | ORAL | 0 refills | Status: DC | PRN

## 2020-08-22 MED ORDER — METHOCARBAMOL 500 MG PO TABS
500.0000 mg | ORAL_TABLET | Freq: Four times a day (QID) | ORAL | 0 refills | Status: DC | PRN
Start: 2020-08-22 — End: 2020-11-20

## 2020-08-22 MED ORDER — ASPIRIN 325 MG PO TBEC: 325.0000 mg | DELAYED_RELEASE_TABLET | Freq: Two times a day (BID) | ORAL | 0 refills | Status: AC

## 2020-08-22 NOTE — Progress Notes (Signed)
Physical Therapy Treatment Patient Details Name: Lisa [name] MRN: [id number] DOB: 05-May-1941 Today's Date: 08/22/2020    History of Present Illness Patient is 79 y.o. female s/p Lt TKA on 08/21/20 with PMH significant for osteopenia, obesity, HTN, HLD, cervical cancer, OA.    PT Comments    Pt ambulated again only shorter distance due to fatigue.  Pt reports overall feeling better today and hopeful for d/c home tomorrow.   Follow Up Recommendations  Follow surgeon's recommendation for DC plan and follow-up therapies;Outpatient PT     Equipment Recommendations  Rolling walker with 5" wheels;3in1 (PT)    Recommendations for Other Services       Precautions / Restrictions Precautions Precautions: Fall;Knee Restrictions Other Position/Activity Restrictions: WBAT    Mobility  Bed Mobility               General bed mobility comments: pt up in recliner  Transfers Overall transfer level: Needs assistance Equipment used: Rolling walker (2 wheeled) Transfers: Sit to/from Stand Sit to Stand: Min assist         General transfer comment: verbal cues for UE and LE positioning, assist to rise and steady; cues for controlling descent  Ambulation/Gait Ambulation/Gait assistance: Min guard Gait Distance (Feet): 25 Feet Assistive device: Rolling walker (2 wheeled) Gait Pattern/deviations: Step-to pattern;Antalgic;Decreased stance time - left Gait velocity: decr   General Gait Details: verbal cues for sequence, RW positioning, posture; pt fatigued quickly   Stairs             Wheelchair Mobility    Modified Rankin (Stroke Patients Only)       Balance                                            Cognition Arousal/Alertness: Awake/alert Behavior During Therapy: WFL for tasks assessed/performed Overall Cognitive Status: Within Functional Limits for tasks assessed                                        Exercises  Total Joint Exercises  Knee Flexion: AAROM;Seated;Left;10 reps    General Comments        Pertinent Vitals/Pain Pain Assessment: 0-10 Pain Score: 6  Pain Location: Lt knee Pain Descriptors / Indicators: Aching;Discomfort Pain Intervention(s): Repositioned;Monitored during session;Ice applied    Home Living                      Prior Function            PT Goals (current goals can now be found in the care plan section) Progress towards PT goals: Progressing toward goals    Frequency    7X/week      PT Plan Current plan remains appropriate    Co-evaluation              AM-PAC PT "6 Clicks" Mobility   Outcome Measure  Help needed turning from your back to your side while in a flat bed without using bedrails?: A Little Help needed moving from lying on your back to sitting on the side of a flat bed without using bedrails?: A Little Help needed moving to and from a bed to a chair (including a wheelchair)?: A Little Help needed standing up from a chair using your arms (e.g.,  wheelchair or bedside chair)?: A Little Help needed to walk in hospital room?: A Little Help needed climbing 3-5 steps with a railing? : A Lot 6 Click Score: 17    End of Session Equipment Utilized During Treatment: Gait belt Activity Tolerance: Patient tolerated treatment well;Patient limited by fatigue Patient left: in chair;with call bell/phone within reach Nurse Communication: Mobility status PT Visit Diagnosis: Muscle weakness (generalized) (M62.81);Difficulty in walking, not elsewhere classified (R26.2)     Time: 6301-6010 PT Time Calculation (min) (ACUTE ONLY): 15 min  Charges:  $Gait Training: 8-22 mins          [name], DPT Acute Rehabilitation Services Pager: [phone] Office: [phone]  [name] 08/22/2020, 3:45 PM

## 2020-08-22 NOTE — Progress Notes (Signed)
Physical Therapy Treatment Patient Details Name: Lisa [name] MRN: [id number] DOB: April 27, 1941 Today's Date: 08/22/2020    History of Present Illness Patient is 79 y.o. female s/p Lt TKA on 08/21/20 with PMH significant for osteopenia, obesity, HTN, HLD, cervical cancer, OA.    PT Comments    Pt ambulated in hallway and performed LE exercises.  Pt had nausea this morning however improved by time of session.  Pt anticipates d/c home with sister tomorrow.    Follow Up Recommendations  Follow surgeon's recommendation for DC plan and follow-up therapies;Outpatient PT     Equipment Recommendations  Rolling walker with 5" wheels;3in1 (PT)    Recommendations for Other Services       Precautions / Restrictions Precautions Precautions: Fall;Knee Restrictions Other Position/Activity Restrictions: WBAT    Mobility  Bed Mobility               General bed mobility comments: pt up in recliner  Transfers Overall transfer level: Needs assistance Equipment used: Rolling walker (2 wheeled) Transfers: Sit to/from Stand Sit to Stand: Min assist         General transfer comment: verbal cues for UE and LE positioning, assist to rise and steady  Ambulation/Gait Ambulation/Gait assistance: Min guard Gait Distance (Feet): 60 Feet Assistive device: Rolling walker (2 wheeled) Gait Pattern/deviations: Step-to pattern;Antalgic;Decreased stance time - left     General Gait Details: verbal cues for sequence, RW positioning, posture, recliner following for safety; pt fatigued quickly   Stairs             Wheelchair Mobility    Modified Rankin (Stroke Patients Only)       Balance                                            Cognition Arousal/Alertness: Awake/alert Behavior During Therapy: WFL for tasks assessed/performed Overall Cognitive Status: Within Functional Limits for tasks assessed                                         Exercises Total Joint Exercises Ankle Circles/Pumps: AROM;Both;20 reps Quad Sets: AROM;Left;10 reps Short Arc Quad: AROM;Left;10 reps Heel Slides: AAROM;Left;10 reps Hip ABduction/ADduction: AROM;Left;10 reps Straight Leg Raises: AAROM;AROM;Left;10 reps    General Comments        Pertinent Vitals/Pain Pain Assessment: 0-10 Pain Score: 6  Pain Location: Lt knee Pain Descriptors / Indicators: Aching;Discomfort Pain Intervention(s): Repositioned;Monitored during session;Ice applied    Home Living                      Prior Function            PT Goals (current goals can now be found in the care plan section) Progress towards PT goals: Progressing toward goals    Frequency    7X/week      PT Plan Current plan remains appropriate    Co-evaluation              AM-PAC PT "6 Clicks" Mobility   Outcome Measure  Help needed turning from your back to your side while in a flat bed without using bedrails?: A Little Help needed moving from lying on your back to sitting on the side of a flat bed without using bedrails?: A Little  Help needed moving to and from a bed to a chair (including a wheelchair)?: A Little Help needed standing up from a chair using your arms (e.g., wheelchair or bedside chair)?: A Little Help needed to walk in hospital room?: A Little Help needed climbing 3-5 steps with a railing? : A Lot 6 Click Score: 17    End of Session Equipment Utilized During Treatment: Gait belt Activity Tolerance: Patient tolerated treatment well Patient left: in chair;with call bell/phone within reach Nurse Communication: Mobility status PT Visit Diagnosis: Muscle weakness (generalized) (M62.81);Difficulty in walking, not elsewhere classified (R26.2)     Time: 6203-5597 PT Time Calculation (min) (ACUTE ONLY): 31 min  Charges:  $Gait Training: 8-22 mins $Therapeutic Exercise: 8-22 mins                     Jannette Spanner PT, DPT Acute Rehabilitation  Services Pager: [phone] Office: [phone]  York Ram E 08/22/2020, 1:02 PM

## 2020-08-22 NOTE — TOC Progression Note (Addendum)
Transition of Care [facility]) - Progression Note    Patient Details  Name: Lisa [name] MRN: [id number] Date of Birth: November 03, 1941  Transition of Care [facility]) CM/SW [place], LCSW Phone Number: 08/22/2020, 10:03 AM  Clinical Narrative:    York Ram Plan of Care, see ortho CM note.  RW and 3 in1 delivered to the patient bedside by [employer].        Expected Discharge Plan and Services           Expected Discharge Date: 08/22/20               DME Arranged: Gilford Rile rolling, 3-N-1 DME Agency: [employer]       HH Arranged: NA HH Agency: NA         Social Determinants of Health (SDOH) Interventions    Readmission Risk Interventions No flowsheet data found.

## 2020-08-22 NOTE — Progress Notes (Signed)
   Subjective: 1 Day Post-Op Procedure(s) (LRB): TOTAL KNEE ARTHROPLASTY (Left) Patient reports pain as moderate.   Patient seen in rounds by Dr. Wynelle Link. Patient is well, and has had no acute complaints or problems other than pain in the left knee. Denies chest pain, SOB, or calf pain. No issues overnight. Foley catheter to be removed this AM. We will continue therapy today.   Objective: Vital signs in last 24 hours: Temp:  [97.6 F (36.4 C)-98.2 F (36.8 C)] 98 F (36.7 C) (08/24 0535) Pulse Rate:  [76-93] 84 (08/24 0535) Resp:  [15-22] 17 (08/24 0535) BP: (124-151)/(62-112) 124/68 (08/24 0535) SpO2:  [94 %-100 %] 95 % (08/24 0535) Weight:  [79.8 kg] 79.8 kg (08/23 0801)  Intake/Output from previous day:  Intake/Output Summary (Last 24 hours) at 08/22/2020 0757 Last data filed at 08/22/2020 0536 Gross per 24 hour  Intake 3237.59 ml  Output 1125 ml  Net 2112.59 ml     Intake/Output this shift: No intake/output data recorded.  Labs: Recent Labs    08/22/20 0325  HGB 10.3*   Recent Labs    08/22/20 0325  WBC 11.4*  RBC 3.91  HCT 32.3*  PLT 210   Recent Labs    08/22/20 0325  NA 138  K 3.5  CL 102  CO2 26  BUN 26*  CREATININE 0.93  GLUCOSE 143*  CALCIUM 8.6*   No results for input(s): LABPT, INR in the last 72 hours.  Exam: General - Patient is Alert and Oriented Extremity - Neurologically intact Neurovascular intact Sensation intact distally Dorsiflexion/Plantar flexion intact Dressing - dressing C/D/I Motor Function - intact, moving foot and toes well on exam.   Past Medical History:  Diagnosis Date  . Allergy   . Arthritis   . Cancer ([place])    cervical  . Complication of anesthesia    Difficulty waking up after hysterectomy in 1973  . Goiter   . Headache    Sinus headaches  . Hyperlipidemia   . Hypertension   . Obesity   . Osteopenia   . Vitamin D deficiency     Assessment/Plan: 1 Day Post-Op Procedure(s) (LRB): TOTAL KNEE  ARTHROPLASTY (Left) Principal Problem:   Osteoarthritis of left knee Active Problems:   Primary osteoarthritis of left knee  Estimated body mass index is 30.21 kg/m as calculated from the following:   Height as of this encounter: 5\' 4"  (1.626 m).   Weight as of this encounter: 79.8 kg. Advance diet Up with therapy D/C IV fluids   Patient's anticipated LOS is less than 2 midnights, meeting these requirements: - Lives within 1 hour of care - Has a competent adult at home to recover with post-op recover - NO history of  - Chronic pain requiring opiods  - Diabetes  - Coronary Artery Disease  - Heart failure  - Heart attack  - Stroke  - DVT/VTE  - Cardiac arrhythmia  - Respiratory Failure/COPD  - Renal failure  - Anemia  - Advanced Liver disease  DVT Prophylaxis - Aspirin Weight bearing as tolerated. Continue therapy.  Plan is to go Home after hospital stay. Possible discharge later today if meeting goals with therapy and pain controlled. Scheduled [facility] O[facility] at [facility]. Follow-up in the office September 7th.  The PDMP database was reviewed today (08/22/2020) prior to any opioid medications being prescribed to this patient.   Theresa Duty, PA-C Orthopedic Surgery [phone] 08/22/2020, 7:57 AM

## 2020-08-23 DIAGNOSIS — E042 Nontoxic multinodular goiter: Secondary | ICD-10-CM | POA: Diagnosis present

## 2020-08-23 DIAGNOSIS — F419 Anxiety disorder, unspecified: Secondary | ICD-10-CM | POA: Diagnosis present

## 2020-08-23 DIAGNOSIS — Z888 Allergy status to other drugs, medicaments and biological substances status: Secondary | ICD-10-CM | POA: Diagnosis not present

## 2020-08-23 DIAGNOSIS — Z885 Allergy status to narcotic agent status: Secondary | ICD-10-CM | POA: Diagnosis not present

## 2020-08-23 DIAGNOSIS — E669 Obesity, unspecified: Secondary | ICD-10-CM | POA: Diagnosis present

## 2020-08-23 DIAGNOSIS — Z880 Allergy status to penicillin: Secondary | ICD-10-CM | POA: Diagnosis not present

## 2020-08-23 DIAGNOSIS — M25562 Pain in left knee: Secondary | ICD-10-CM | POA: Diagnosis present

## 2020-08-23 DIAGNOSIS — Z91048 Other nonmedicinal substance allergy status: Secondary | ICD-10-CM | POA: Diagnosis not present

## 2020-08-23 DIAGNOSIS — E559 Vitamin D deficiency, unspecified: Secondary | ICD-10-CM | POA: Diagnosis present

## 2020-08-23 DIAGNOSIS — E785 Hyperlipidemia, unspecified: Secondary | ICD-10-CM | POA: Diagnosis present

## 2020-08-23 DIAGNOSIS — I10 Essential (primary) hypertension: Secondary | ICD-10-CM | POA: Diagnosis present

## 2020-08-23 DIAGNOSIS — Z8541 Personal history of malignant neoplasm of cervix uteri: Secondary | ICD-10-CM | POA: Diagnosis not present

## 2020-08-23 DIAGNOSIS — M25762 Osteophyte, left knee: Secondary | ICD-10-CM | POA: Diagnosis present

## 2020-08-23 DIAGNOSIS — Z8349 Family history of other endocrine, nutritional and metabolic diseases: Secondary | ICD-10-CM | POA: Diagnosis not present

## 2020-08-23 DIAGNOSIS — Z6830 Body mass index (BMI) 30.0-30.9, adult: Secondary | ICD-10-CM | POA: Diagnosis not present

## 2020-08-23 DIAGNOSIS — Z79899 Other long term (current) drug therapy: Secondary | ICD-10-CM | POA: Diagnosis not present

## 2020-08-23 DIAGNOSIS — M1712 Unilateral primary osteoarthritis, left knee: Secondary | ICD-10-CM | POA: Diagnosis present

## 2020-08-23 DIAGNOSIS — Z8249 Family history of ischemic heart disease and other diseases of the circulatory system: Secondary | ICD-10-CM | POA: Diagnosis not present

## 2020-08-23 LAB — CBC
HCT: 30 % — ABNORMAL LOW (ref 36.0–46.0)
Hemoglobin: 9.9 g/dL — ABNORMAL LOW (ref 12.0–15.0)
MCH: 26.8 pg (ref 26.0–34.0)
MCHC: 33 g/dL (ref 30.0–36.0)
MCV: 81.1 fL (ref 80.0–100.0)
Platelets: 191 10*3/uL (ref 150–400)
RBC: 3.7 MIL/uL — ABNORMAL LOW (ref 3.87–5.11)
RDW: 16.4 % — ABNORMAL HIGH (ref 11.5–15.5)
WBC: 13.5 10*3/uL — ABNORMAL HIGH (ref 4.0–10.5)
nRBC: 0 % (ref 0.0–0.2)

## 2020-08-23 LAB — BASIC METABOLIC PANEL
Anion gap: 9 (ref 5–15)
BUN: 32 mg/dL — ABNORMAL HIGH (ref 8–23)
CO2: 26 mmol/L (ref 22–32)
Calcium: 8.7 mg/dL — ABNORMAL LOW (ref 8.9–10.3)
Chloride: 104 mmol/L (ref 98–111)
Creatinine, Ser: 0.93 mg/dL (ref 0.44–1.00)
GFR calc Af Amer: >60 mL/min (ref >60)
GFR calc non Af Amer: 60 mL/min — ABNORMAL LOW (ref >60)
Glucose, Bld: 131 mg/dL — ABNORMAL HIGH (ref 70–99)
Potassium: 3.7 mmol/L (ref 3.5–5.1)
Sodium: 139 mmol/L (ref 135–145)

## 2020-08-23 NOTE — Progress Notes (Signed)
Physical Therapy Treatment Patient Details Name: Lisa [name] MRN: [id number] DOB: [birth year] Today's Date: 08/23/2020    History of Present Illness Patient is 79 y.o. female s/p Lt TKA on 08/21/20 with PMH significant for osteopenia, obesity, HTN, HLD, cervical cancer, OA.    PT Comments    Pt ambulated in hallway and performed a couple exercises. Pt required a little assist to rise from recliner however feels she will be able to perform better at home.  Pt anticipates d/c home with sister today.    Follow Up Recommendations  Follow surgeon's recommendation for DC plan and follow-up therapies     Equipment Recommendations  Rolling walker with 5" wheels;3in1 (PT)    Recommendations for Other Services       Precautions / Restrictions Precautions Precautions: Fall;Knee Restrictions Weight Bearing Restrictions: No Other Position/Activity Restrictions: WBAT    Mobility  Bed Mobility               General bed mobility comments: pt up in recliner  Transfers Overall transfer level: Needs assistance Equipment used: Rolling walker (2 wheeled) Transfers: Sit to/from Stand Sit to Stand: Min assist         General transfer comment: verbal cues for UE and LE positioning, assist to rise and steady; cues for controlling descent  Ambulation/Gait Ambulation/Gait assistance: Min guard Gait Distance (Feet): 45 Feet Assistive device: Rolling walker (2 wheeled) Gait Pattern/deviations: Step-to pattern;Antalgic;Decreased stance time - left Gait velocity: decr   General Gait Details: verbal cues for sequence, RW positioning, posture; pt fatigued quickly   Stairs             Wheelchair Mobility    Modified Rankin (Stroke Patients Only)       Balance                                            Cognition Arousal/Alertness: Awake/alert Behavior During Therapy: WFL for tasks assessed/performed Overall Cognitive Status: Within Functional  Limits for tasks assessed                                        Exercises Total Joint Exercises Ankle Circles/Pumps: AROM;Both;10 reps Quad Sets: AROM;Left;10 reps Heel Slides: AAROM;Left;10 reps    General Comments        Pertinent Vitals/Pain Pain Assessment: 0-10 Pain Score: 5  Pain Location: Lt knee Pain Descriptors / Indicators: Aching;Discomfort Pain Intervention(s): Repositioned;Monitored during session    Home Living                      Prior Function            PT Goals (current goals can now be found in the care plan section) Progress towards PT goals: Progressing toward goals    Frequency    7X/week      PT Plan Current plan remains appropriate    Co-evaluation              AM-PAC PT "6 Clicks" Mobility   Outcome Measure  Help needed turning from your back to your side while in a flat bed without using bedrails?: A Little Help needed moving from lying on your back to sitting on the side of a flat bed without using bedrails?: A Little Help needed  moving to and from a bed to a chair (including a wheelchair)?: A Little Help needed standing up from a chair using your arms (e.g., wheelchair or bedside chair)?: A Little Help needed to walk in hospital room?: A Little Help needed climbing 3-5 steps with a railing? : A Lot 6 Click Score: 17    End of Session Equipment Utilized During Treatment: Gait belt Activity Tolerance: Patient tolerated treatment well;Patient limited by fatigue Patient left: in chair;with call bell/phone within reach;with chair alarm set Nurse Communication: Mobility status PT Visit Diagnosis: Muscle weakness (generalized) (M62.81);Difficulty in walking, not elsewhere classified (R26.2)     Time: 0122-2411 PT Time Calculation (min) (ACUTE ONLY): 19 min  Charges:  $Gait Training: 8-22 mins                    [name], DPT Acute Rehabilitation Services Pager: [phone] Office:  [phone]  [name] 08/23/2020, 3:17 PM

## 2020-08-23 NOTE — Progress Notes (Signed)
   Subjective: 2 Days Post-Op Procedure(s) (LRB): TOTAL KNEE ARTHROPLASTY (Left) Patient reports pain as mild.   Patient seen in rounds with Dr. Wynelle Link. Patient is well, and has had no acute complaints or problems. States she is feeling better this AM. No issues overnight. Voiding without difficulty. Denies chest pain or SOB. Plan is to go Home after hospital stay.  Objective: Vital signs in last 24 hours: Temp:  [97.7 F (36.5 C)-98.4 F (36.9 C)] 98.2 F (36.8 C) (08/25 0610) Pulse Rate:  [84-93] 93 (08/25 0610) Resp:  [16] 16 (08/25 0610) BP: (140-155)/(59-70) 155/70 (08/25 0610) SpO2:  [95 %-99 %] 97 % (08/25 0610)  Intake/Output from previous day:  Intake/Output Summary (Last 24 hours) at 08/23/2020 0700 Last data filed at 08/22/2020 2200 Gross per 24 hour  Intake 1304.53 ml  Output 1050 ml  Net 254.53 ml    Intake/Output this shift: Total I/O In: -  Out: 400 [Urine:400]  Labs: Recent Labs    08/22/20 0325 08/23/20 0223  HGB 10.3* 9.9*   Recent Labs    08/22/20 0325 08/23/20 0223  WBC 11.4* 13.5*  RBC 3.91 3.70*  HCT 32.3* 30.0*  PLT 210 191   Recent Labs    08/22/20 0325 08/23/20 0223  NA 138 139  K 3.5 3.7  CL 102 104  CO2 26 26  BUN 26* 32*  CREATININE 0.93 0.93  GLUCOSE 143* 131*  CALCIUM 8.6* 8.7*   No results for input(s): LABPT, INR in the last 72 hours.  Exam: General - Patient is Alert and Oriented Extremity - Neurologically intact Neurovascular intact Sensation intact distally Dorsiflexion/Plantar flexion intact Dressing/Incision - clean, dry, no drainage Motor Function - intact, moving foot and toes well on exam.   Past Medical History:  Diagnosis Date  . Allergy   . Arthritis   . Cancer ([place])    cervical  . Complication of anesthesia    Difficulty waking up after hysterectomy in 1973  . Goiter   . Headache    Sinus headaches  . Hyperlipidemia   . Hypertension   . Obesity   . Osteopenia   . Vitamin D deficiency       Assessment/Plan: 2 Days Post-Op Procedure(s) (LRB): TOTAL KNEE ARTHROPLASTY (Left) Principal Problem:   Osteoarthritis of left knee Active Problems:   Primary osteoarthritis of left knee  Estimated body mass index is 30.21 kg/m as calculated from the following:   Height as of this encounter: 5\' 4"  (1.626 m).   Weight as of this encounter: 79.8 kg. Up with therapy D/C IV fluids  DVT Prophylaxis - Aspirin Weight-bearing as tolerated  Plan for discharge after one session of PT this AM. Scheduled for OPPT beginning Friday.  Theresa Duty, PA-C Orthopedic Surgery [phone] 08/23/2020, 7:00 AM

## 2020-08-23 NOTE — Progress Notes (Signed)
Orthopedic Tech Progress Note Patient Details:  Lisa [name] 04/01/1941 [id number]  [name], female   DOB: 1941/11/01, 79 y.o.   MRN: [id number]   Lisa [name] 08/23/2020, 1:55 PMPickup CPM and OHF

## 2020-08-24 DIAGNOSIS — M25662 Stiffness of left knee, not elsewhere classified: Secondary | ICD-10-CM | POA: Diagnosis not present

## 2020-08-24 DIAGNOSIS — M25562 Pain in left knee: Secondary | ICD-10-CM | POA: Insufficient documentation

## 2020-08-28 DIAGNOSIS — M25562 Pain in left knee: Secondary | ICD-10-CM | POA: Diagnosis not present

## 2020-08-28 NOTE — Discharge Summary (Signed)
Physician Discharge Summary   [name] MRN: [id number] DOB/AGE: 77-04-1941 77 y.o.  Admit date: 08/21/2020 Discharge date: 08/23/2020  Primary Diagnosis: Osteoarthritis, left knee   Admission Diagnoses:  Past Medical History:  Diagnosis Date  . Allergy   . Arthritis   . Cancer ([place])    cervical  . Complication of anesthesia    Difficulty waking up after hysterectomy in 1973  . Goiter   . Headache    Sinus headaches  . Hyperlipidemia   . Hypertension   . Obesity   . Osteopenia   . Vitamin D deficiency    Discharge Diagnoses:   Principal Problem:   Osteoarthritis of left knee Active Problems:   Primary osteoarthritis of left knee  Estimated body mass index is 30.21 kg/m as calculated from the following:   Height as of this encounter: 5\' 4"  (1.626 m).   Weight as of this encounter: 79.8 kg.  Procedure:  Procedure(s) (LRB): TOTAL KNEE ARTHROPLASTY (Left)   Consults: None  HPI: Lisa [name] is a 79 y.o. year old female with end stage OA of her left knee with progressively worsening pain and dysfunction. She has constant pain, with activity and at rest and significant functional deficits with difficulties even with ADLs. She has had extensive non-op management including analgesics, injections of cortisone and viscosupplements, and home exercise program, but remains in significant pain with significant dysfunction. Radiographs show bone on bone arthritis lateral and patellofemoral. She presents now for left Total Knee Arthroplasty.   Laboratory Data: Admission on 08/21/2020, Discharged on 08/23/2020  Component Date Value Ref Range Status  . ABO/RH(D) 08/21/2020    Final                   Value:O POS Performed at [facility], [place] [address] [place], [place] [address]   . WBC 08/22/2020 11.4* 4.0 - 10.5 K/uL Final  . RBC 08/22/2020 3.91  3.87 - 5.11 MIL/uL Final  . Hemoglobin 08/22/2020 10.3* 12.0 - 15.0 g/dL Final  . HCT  08/22/2020 32.3* 36 - 46 % Final  . MCV 08/22/2020 82.6  80.0 - 100.0 fL Final  . MCH 08/22/2020 26.3  26.0 - 34.0 pg Final  . MCHC 08/22/2020 31.9  30.0 - 36.0 g/dL Final  . RDW 08/22/2020 16.5* 11.5 - 15.5 % Final  . Platelets 08/22/2020 210  150 - 400 K/uL Final  . nRBC 08/22/2020 0.0  0.0 - 0.2 % Final   Performed at [facility], [place] [address], [place], [place] [address]  . Sodium 08/22/2020 138  135 - 145 mmol/L Final  . Potassium 08/22/2020 3.5  3.5 - 5.1 mmol/L Final  . Chloride 08/22/2020 102  98 - 111 mmol/L Final  . CO2 08/22/2020 26  22 - 32 mmol/L Final  . Glucose, Bld 08/22/2020 143* 70 - 99 mg/dL Final   Glucose reference range applies only to samples taken after fasting for at least 8 hours.  . BUN 08/22/2020 26* 8 - 23 mg/dL Final  . Creatinine, Ser 08/22/2020 0.93  0.44 - 1.00 mg/dL Final  . Calcium 08/22/2020 8.6* 8.9 - 10.3 mg/dL Final  . GFR calc non Af Amer 08/22/2020 60* >60 mL/min Final  . GFR calc Af Amer 08/22/2020 >60  >60 mL/min Final  . Anion gap 08/22/2020 10  5 - 15 Final   Performed at [facility], [place] [address] [place], [place] [address]  . WBC 08/23/2020 13.5*  4.0 - 10.5 K/uL Final  . RBC 08/23/2020 3.70* 3.87 - 5.11 MIL/uL Final  . Hemoglobin 08/23/2020 9.9* 12.0 - 15.0 g/dL Final  . HCT 08/23/2020 30.0* 36 - 46 % Final  . MCV 08/23/2020 81.1  80.0 - 100.0 fL Final  . MCH 08/23/2020 26.8  26.0 - 34.0 pg Final  . MCHC 08/23/2020 33.0  30.0 - 36.0 g/dL Final  . RDW 08/23/2020 16.4* 11.5 - 15.5 % Final  . Platelets 08/23/2020 191  150 - 400 K/uL Final  . nRBC 08/23/2020 0.0  0.0 - 0.2 % Final   Performed at [facility], [place] [address], [place], [place] [address]  . Sodium 08/23/2020 139  135 - 145 mmol/L Final  . Potassium 08/23/2020 3.7  3.5 - 5.1 mmol/L Final  . Chloride 08/23/2020 104  98 - 111 mmol/L Final  . CO2 08/23/2020 26  22 - 32 mmol/L Final  . Glucose, Bld 08/23/2020 131* 70 -  99 mg/dL Final   Glucose reference range applies only to samples taken after fasting for at least 8 hours.  . BUN 08/23/2020 32* 8 - 23 mg/dL Final  . Creatinine, Ser 08/23/2020 0.93  0.44 - 1.00 mg/dL Final  . Calcium 08/23/2020 8.7* 8.9 - 10.3 mg/dL Final  . GFR calc non Af Amer 08/23/2020 60* >60 mL/min Final  . GFR calc Af Amer 08/23/2020 >60  >60 mL/min Final  . Anion gap 08/23/2020 9  5 - 15 Final   Performed at [facility], [place] [address], [place], [place] [address]  Hospital Outpatient Visit on 08/17/2020  Component Date Value Ref Range Status  . SARS Coronavirus 2 08/17/2020 NEGATIVE  NEGATIVE Final   Comment: (NOTE) SARS-CoV-2 target nucleic acids are NOT DETECTED.  The SARS-CoV-2 RNA is generally detectable in upper and lower respiratory specimens during the acute phase of infection. Negative results do not preclude SARS-CoV-2 infection, do not rule out co-infections with other pathogens, and should not be used as the sole basis for treatment or other patient management decisions. Negative results must be combined with clinical observations, patient history, and epidemiological information. The expected result is Negative.  Fact Sheet for Patients: SugarRoll.be  Fact Sheet for Healthcare Providers: https://www.woods-mathews.com/  This test is not yet approved or cleared by the [place] FDA and  has been authorized for detection and/or diagnosis of SARS-CoV-2 by FDA under an Emergency Use Authorization (EUA). This EUA will remain  in effect (meaning this test can be used) for the duration of the COVID-19 declaration under Se                          ction 564(b)(1) of the Act, 21 U.S.C. section 360bbb-3(b)(1), unless the authorization is terminated or revoked sooner.  Performed at [facility], [place] [address], [place], [place] [address]   Hospital Outpatient Visit on 08/17/2020  Component Date  Value Ref Range Status  . aPTT 08/17/2020 40* 24 - 36 seconds Final   Comment:        IF BASELINE aPTT IS ELEVATED, SUGGEST PATIENT RISK ASSESSMENT BE USED TO DETERMINE APPROPRIATE ANTICOAGULANT THERAPY. Performed at [facility], [place] [address] [place], [place] [address]   . WBC 08/17/2020 7.8  4.0 - 10.5 K/uL Final  . RBC 08/17/2020 5.07  3.87 - 5.11 MIL/uL Final  . Hemoglobin 08/17/2020 12.3  12.0 - 15.0 g/dL Final  . HCT 08/17/2020 40.3  36 - 46 % Final  . MCV 08/17/2020 79.5* 80.0 - 100.0 fL Final  . MCH 08/17/2020 24.3* 26.0 - 34.0 pg Final  . MCHC 08/17/2020 30.5  30.0 - 36.0 g/dL Final   CORRECTED FOR COLD AGGLUTININS  . RDW 08/17/2020 16.1* 11.5 - 15.5 % Final  . Platelets 08/17/2020 231  150 - 400 K/uL Final  . nRBC 08/17/2020 0.0  0.0 - 0.2 % Final   Performed at [facility], [place] [address], [place], [place] [address]  . Sodium 08/17/2020 144  135 - 145 mmol/L Final  . Potassium 08/17/2020 3.8  3.5 - 5.1 mmol/L Final  . Chloride 08/17/2020 102  98 - 111 mmol/L Final  . CO2 08/17/2020 31  22 - 32 mmol/L Final  . Glucose, Bld 08/17/2020 103* 70 - 99 mg/dL Final   Glucose reference range applies only to samples taken after fasting for at least 8 hours.  . BUN 08/17/2020 31* 8 - 23 mg/dL Final  . Creatinine, Ser 08/17/2020 1.22* 0.44 - 1.00 mg/dL Final  . Calcium 08/17/2020 9.5  8.9 - 10.3 mg/dL Final  . Total Protein 08/17/2020 7.7  6.5 - 8.1 g/dL Final  . Albumin 08/17/2020 4.1  3.5 - 5.0 g/dL Final  . AST 08/17/2020 19  15 - 41 U/L Final  . ALT 08/17/2020 14  0 - 44 U/L Final  . Alkaline Phosphatase 08/17/2020 89  38 - 126 U/L Final  . Total Bilirubin 08/17/2020 0.9  0.3 - 1.2 mg/dL Final  . GFR calc non Af Amer 08/17/2020 43* >60 mL/min Final  . GFR calc Af Amer 08/17/2020 50* >60 mL/min Final  . Anion gap 08/17/2020 11  5 - 15 Final   Performed at [facility], [place] [address], [place], [place] [address]  .  Prothrombin Time 08/17/2020 12.5  11.4 - 15.2 seconds Final  . INR 08/17/2020 1.0  0.8 - 1.2 Final   Comment: (NOTE) INR goal varies based on device and disease states. Performed at [facility], [place] [address], [place], [place] [address]   . ABO/RH(D) 08/17/2020 O POS   Final  . Antibody Screen 08/17/2020 NEG   Final  . Sample Expiration 08/17/2020 08/24/2020,2359   Final  . Extend sample reason 08/17/2020    Final                   Value:NO TRANSFUSIONS OR PREGNANCY IN THE PAST 3 MONTHS Performed at [place] [address], [place], [place] [address]   . MRSA, PCR 08/17/2020 NEGATIVE  NEGATIVE Final  . Staphylococcus aureus 08/17/2020 NEGATIVE  NEGATIVE Final   Comment: (NOTE) The Xpert SA Assay (FDA approved for NASAL specimens in patients 14 years of age and older), is one component of a comprehensive surveillance program. It is not intended to diagnose infection nor to guide or monitor treatment. Performed at [facility], [place] [address], [place], [place] [address]   Office Visit on 08/09/2020  Component Date Value Ref Range Status  . Creat 08/09/2020 1.06* 0.60 - 0.93 mg/dL Final   Comment: For patients >54 years of age, the reference limit for Creatinine is approximately 13% higher for people identified as African-American. .   Michaelle Copas, UA 08/09/2020 YELLOW   Final  . Clarity, UA 08/09/2020 CLEAR   Final  . Glucose, UA 08/09/2020 Negative  Negative Final  . Bilirubin, UA 08/09/2020 NEG   Final  . Ketones, UA  08/09/2020 neg   Final  . Spec Grav, UA 08/09/2020 1.010  1.010 - 1.025 Final  . Blood, UA 08/09/2020 neg   Final  . pH, UA 08/09/2020 6.5  5.0 - 8.0 Final  . Protein, UA 08/09/2020 Negative  Negative Final  . Urobilinogen, UA 08/09/2020 0.2  0.2 or 1.0 E.U./dL Final  . Nitrite, UA 08/09/2020 neg   Final  . Leukocytes, UA 08/09/2020 Negative  Negative Final  . Appearance 08/09/2020 neg   Final  .  Odor 08/09/2020 neg   Final  Lab on 08/04/2020  Component Date Value Ref Range Status  . WBC 08/04/2020 6.7  3.8 - 10.8 Thousand/uL Final  . RBC 08/04/2020 4.95  3.80 - 5.10 Million/uL Final  . Hemoglobin 08/04/2020 12.3  11.7 - 15.5 g/dL Final  . HCT 08/04/2020 38.2  35 - 45 % Final  . MCV 08/04/2020 77.2* 80.0 - 100.0 fL Final  . MCH 08/04/2020 24.8* 27.0 - 33.0 pg Final  . MCHC 08/04/2020 32.2  32.0 - 36.0 g/dL Final  . RDW 08/04/2020 14.8  11.0 - 15.0 % Final  . Platelets 08/04/2020 260  140 - 400 Thousand/uL Final  . MPV 08/04/2020 9.4  7.5 - 12.5 fL Final  . Neutro Abs 08/04/2020 4,951  1,500 - 7,800 cells/uL Final  . Lymphs Abs 08/04/2020 1,213  850 - 3,900 cells/uL Final  . Absolute Monocytes 08/04/2020 482  200 - 950 cells/uL Final  . Eosinophils Absolute 08/04/2020 27  15 - 500 cells/uL Final  . Basophils Absolute 08/04/2020 27  0 - 200 cells/uL Final  . Neutrophils Relative % 08/04/2020 73.9  % Final  . Total Lymphocyte 08/04/2020 18.1  % Final  . Monocytes Relative 08/04/2020 7.2  % Final  . Eosinophils Relative 08/04/2020 0.4  % Final  . Basophils Relative 08/04/2020 0.4  % Final  . Glucose, Bld 08/04/2020 109* 65 - 99 mg/dL Final   Comment: .            Fasting reference interval . For someone without known diabetes, a glucose value between 100 and 125 mg/dL is consistent with prediabetes and should be confirmed with a follow-up test. .   . BUN 08/04/2020 32* 7 - 25 mg/dL Final  . Creat 08/04/2020 1.10* 0.60 - 0.93 mg/dL Final   Comment: For patients >46 years of age, the reference limit for Creatinine is approximately 13% higher for people identified as African-American. .   . GFR, Est Non African American 08/04/2020 49* > OR = 60 mL/min/1.25m2 Final  . GFR, Est African American 08/04/2020 56* > OR = 60 mL/min/1.61m2 Final  . BUN/Creatinine Ratio 08/04/2020 29* 6 - 22 (calc) Final  . Sodium 08/04/2020 139  135 - 146 mmol/L Final  . Potassium 08/04/2020 4.0   3.5 - 5.3 mmol/L Final  . Chloride 08/04/2020 102  98 - 110 mmol/L Final  . CO2 08/04/2020 23  20 - 32 mmol/L Final  . Calcium 08/04/2020 9.7  8.6 - 10.4 mg/dL Final  . Total Protein 08/04/2020 7.6  6.1 - 8.1 g/dL Final  . Albumin 08/04/2020 4.4  3.6 - 5.1 g/dL Final  . Globulin 08/04/2020 3.2  1.9 - 3.7 g/dL (calc) Final  . AG Ratio 08/04/2020 1.4  1.0 - 2.5 (calc) Final  . Total Bilirubin 08/04/2020 0.6  0.2 - 1.2 mg/dL Final  . Alkaline phosphatase (APISO) 08/04/2020 100  37 - 153 U/L Final  . AST 08/04/2020 16  10 - 35 U/L Final  .  ALT 08/04/2020 9  6 - 29 U/L Final  . Hgb A1c MFr Bld 08/04/2020 5.7* <5.7 % of total Hgb Final   Comment: For someone without known diabetes, a hemoglobin  A1c value between 5.7% and 6.4% is consistent with prediabetes and should be confirmed with a  follow-up test. . For someone with known diabetes, a value <7% indicates that their diabetes is well controlled. A1c targets should be individualized based on duration of diabetes, age, comorbid conditions, and other considerations. . This assay result is consistent with an increased risk of diabetes. . Currently, no consensus exists regarding use of hemoglobin A1c for diagnosis of diabetes for children. .   . Mean Plasma Glucose 08/04/2020 117  (calc) Final  . eAG (mmol/L) 08/04/2020 6.5  (calc) Final  . INR 08/04/2020 1.0   Final   Comment: Reference Range                     0.9-1.1 Moderate-intensity Warfarin Therapy 2.0-3.0 Higher-intensity Warfarin Therapy   3.0-4.0  .   Marland Kitchen Prothrombin Time 08/04/2020 10.4  9.0 - 11.5 sec Final   Comment: For additional information, please refer to http://education.questdiagnostics.com/faq/FAQ104 (This link is being provided for informational/ educational purposes only.)   . Cholesterol 08/04/2020 159  <200 mg/dL Final  . HDL 08/04/2020 66  > OR = 50 mg/dL Final  . Triglycerides 08/04/2020 74  <150 mg/dL Final  . LDL Cholesterol (Calc) 08/04/2020 78   mg/dL (calc) Final   Comment: Reference range: <100 . Desirable range <100 mg/dL for primary prevention;   <70 mg/dL for patients with CHD or diabetic patients  with > or = 2 CHD risk factors. Marland Kitchen LDL-C is now calculated using the Martin-Hopkins  calculation, which is a validated novel method providing  better accuracy than the Friedewald equation in the  estimation of LDL-C.  Cresenciano Genre et al. Annamaria Helling. 5053;976(73): 2061-2068  (http://education.[facility].com/faq/FAQ164)   . Total CHOL/HDL Ratio 08/04/2020 2.4  <5.0 (calc) Final  . Non-HDL Cholesterol (Calc) 08/04/2020 93  <130 mg/dL (calc) Final   Comment: For patients with diabetes plus 1 major ASCVD risk  factor, treating to a non-HDL-C goal of <100 mg/dL  (LDL-C of <70 mg/dL) is considered a therapeutic  option.   . TSH 08/04/2020 1.30  0.40 - 4.50 mIU/L Final  . Ferritin 08/04/2020 103  16 - 288 ng/mL Final  . TEST NAME: 08/04/2020 FERRITIN   Final  . TEST CODE: 08/04/2020 [id number]   Final  . CLIENT CONTACT: 08/04/2020 ARICELI    Final  . REPORT ALWAYS MESSAGE SIGNATURE 08/04/2020    Final   Comment: . The laboratory testing on this patient was verbally requested or confirmed by the ordering physician or his or her authorized representative after contact with an employee of [employer]. Federal regulations require that we maintain on file written authorization for all laboratory testing.  Accordingly we are asking that the ordering physician or his or her authorized representative sign a copy of this report and promptly return it to the client service representative. . . Signature:____________________________________________________ . Please fax this signed page to [phone] or return it via your [employer] courier.   Lab on 07/25/2020  Component Date Value Ref Range Status  . Total Protein 07/25/2020 7.4  6.1 - 8.1 g/dL Final  . Albumin 07/25/2020 4.4  3.6 - 5.1 g/dL Final  . Globulin 07/25/2020  3.0  1.9 - 3.7 g/dL (calc) Final  . AG Ratio 07/25/2020 1.5  1.0 - 2.5 (calc) Final  . Total Bilirubin 07/25/2020 0.6  0.2 - 1.2 mg/dL Final  . Bilirubin, Direct 07/25/2020 0.2  0.0 - 0.2 mg/dL Final  . Indirect Bilirubin 07/25/2020 0.4  0.2 - 1.2 mg/dL (calc) Final  . Alkaline phosphatase (APISO) 07/25/2020 98  37 - 153 U/L Final  . AST 07/25/2020 16  10 - 35 U/L Final  . ALT 07/25/2020 10  6 - 29 U/L Final  . Cholesterol 07/25/2020 160  <200 mg/dL Final  . HDL 07/25/2020 59  > OR = 50 mg/dL Final  . Triglycerides 07/25/2020 67  <150 mg/dL Final  . LDL Cholesterol (Calc) 07/25/2020 86  mg/dL (calc) Final   Comment: Reference range: <100 . Desirable range <100 mg/dL for primary prevention;   <70 mg/dL for patients with CHD or diabetic patients  with > or = 2 CHD risk factors. Marland Kitchen LDL-C is now calculated using the Martin-Hopkins  calculation, which is a validated novel method providing  better accuracy than the Friedewald equation in the  estimation of LDL-C.  Cresenciano Genre et al. Annamaria Helling. 3976;734(19): 2061-2068  (http://education.[facility].com/faq/FAQ164)   . Total CHOL/HDL Ratio 07/25/2020 2.7  <5.0 (calc) Final  . Non-HDL Cholesterol (Calc) 07/25/2020 101  <130 mg/dL (calc) Final   Comment: For patients with diabetes plus 1 major ASCVD risk  factor, treating to a non-HDL-C goal of <100 mg/dL  (LDL-C of <70 mg/dL) is considered a therapeutic  option.      X-Rays:No results found.  EKG: Orders placed or performed during the hospital encounter of 08/17/20  . EKG 12-Lead  . EKG 12-Lead     Hospital Course: Lisa [name] is a 79 y.o. who was admitted to [facility]. They were brought to the operating room on 08/21/2020 and underwent Procedure(s): TOTAL KNEE ARTHROPLASTY.  Patient tolerated the procedure well and was later transferred to the recovery room and then to the orthopaedic floor for postoperative care. They were given PO and IV analgesics for pain control  following their surgery. They were given 24 hours of postoperative antibiotics of  Anti-infectives (From admission, onward)   Start     Dose/Rate Route Frequency Ordered Stop   08/21/20 2200  vancomycin (VANCOCIN) IVPB 1000 mg/200 mL premix        1,000 mg 200 mL/hr over 60 Minutes Intravenous Every 12 hours 08/21/20 1216 08/21/20 2152   08/21/20 0730  vancomycin (VANCOCIN) IVPB 1000 mg/200 mL premix        1,000 mg 200 mL/hr over 60 Minutes Intravenous On call to O.R. 08/21/20 0725 08/21/20 0950     and started on DVT prophylaxis in the form of Aspirin.   PT and OT were ordered for total joint protocol. Discharge planning consulted to help with postop disposition and equipment needs. Patient had a good night on the evening of surgery. They started to get up OOB with therapy on POD #1. Continued to work with therapy into POD #2. Pt was seen during rounds on day two and was ready to go home pending progress with therapy. Dressing was changed and the incision was clean, dry, and intact. Pt worked with therapy for one additional session and was meeting their goals. She was discharged to home later that day in stable condition.  Diet: Regular diet Activity: WBAT Follow-up: in 2 weeks Disposition: Home with outpatient physical therapy Discharged Condition: stable   Discharge Instructions    Call MD / Call 911   Complete by: As directed  If you experience chest pain or shortness of breath, CALL 911 and be transported to the hospital emergency room.  If you develope a fever above 101 F, pus (white drainage) or increased drainage or redness at the wound, or calf pain, call your surgeon's office.   Change dressing   Complete by: As directed    You may remove the bulky bandage (ACE wrap and gauze) two days after surgery. You will have an adhesive waterproof bandage underneath. Leave this in place until your first follow-up appointment.   Constipation Prevention   Complete by: As directed    Drink  plenty of fluids.  Prune juice may be helpful.  You may use a stool softener, such as Colace (over the counter) 100 mg twice a day.  Use MiraLax (over the counter) for constipation as needed.   Diet - low sodium heart healthy   Complete by: As directed    Do not put a pillow under the knee. Place it under the heel.   Complete by: As directed    Driving restrictions   Complete by: As directed    No driving for two weeks   TED hose   Complete by: As directed    Use stockings (TED hose) for three weeks on both leg(s).  You may remove them at night for sleeping.   Weight bearing as tolerated   Complete by: As directed      Allergies as of 08/23/2020      Reactions   Penicillins Other (See Comments)   Chest pain 12 years   Codeine Other (See Comments)   Hard time waking-up   Darvon Nausea And Vomiting   Tall Ragweed Itching, Other (See Comments)   Seasonal  Congestion/ watery eyes/ Sneezing and chest congestion, itching eyes and ears [place]      Medication List    TAKE these medications   acetaminophen 500 MG tablet Commonly known as: TYLENOL Take 500-1,000 mg by mouth every 6 (six) hours as needed for mild pain or moderate pain.   amLODipine 5 MG tablet Commonly known as: NORVASC TAKE 1 TABLET(5 MG) BY MOUTH DAILY What changed:   how much to take  how to take this  when to take this  additional instructions   aspirin 325 MG EC tablet Take 1 tablet (325 mg total) by mouth 2 (two) times daily for 20 days. Then take one 81 mg aspirin once a day for three weeks. Then discontinue aspirin.   fluticasone 50 MCG/ACT nasal spray Commonly known as: FLONASE Place 2 sprays into both nostrils daily as needed for allergies or rhinitis.   Maxzide 75-50 MG tablet Generic drug: triamterene-hydrochlorothiazide TAKE 1 TABLET BY MOUTH EVERY DAY   methocarbamol 500 MG tablet Commonly known as: ROBAXIN Take 1 tablet (500 mg total) by mouth every 6 (six) hours as needed for  muscle spasms.   OVER THE COUNTER MEDICATION Apply 1 application topically daily as needed (apply to Knees). Unkers   oxyCODONE 5 MG immediate release tablet Commonly known as: Oxy IR/ROXICODONE Take 1-2 tablets (5-10 mg total) by mouth every 6 (six) hours as needed for severe pain.   rosuvastatin 5 MG tablet Commonly known as: Crestor One po 3 times a week with supper What changed:   how much to take  how to take this  when to take this  additional instructions   traMADol 50 MG tablet Commonly known as: ULTRAM Take 1-2 tablets (50-100 mg total) by mouth every 6 (six)  hours as needed for moderate pain.            Discharge Care Instructions  (From admission, onward)         Start     Ordered   08/22/20 0000  Weight bearing as tolerated        08/22/20 0800   08/22/20 0000  Change dressing       Comments: You may remove the bulky bandage (ACE wrap and gauze) two days after surgery. You will have an adhesive waterproof bandage underneath. Leave this in place until your first follow-up appointment.   08/22/20 0800          Follow-up Information    Gaynelle Arabian, MD. Go on 09/05/2020.   Specialty: Orthopedic Surgery Why: You are scheduled for a post-operative appointment on 09-05-20 at 3:15 pm.  Contact information: [address] [place] [address] [phone]        Rosilyn Mings.. Go on 08/24/2020.   Why: You are scheduled for a physical therapy appointment on 08-24-20 at 2:00 pm.  Contact information: [place] [address] [phone]               Signed: Theresa Duty, PA-C Orthopedic Surgery 08/28/2020, 7:22 AM

## 2020-08-30 DIAGNOSIS — M25562 Pain in left knee: Secondary | ICD-10-CM | POA: Diagnosis not present

## 2020-09-01 DIAGNOSIS — M25562 Pain in left knee: Secondary | ICD-10-CM | POA: Diagnosis not present

## 2020-09-05 DIAGNOSIS — M25562 Pain in left knee: Secondary | ICD-10-CM | POA: Diagnosis not present

## 2020-09-07 DIAGNOSIS — M25662 Stiffness of left knee, not elsewhere classified: Secondary | ICD-10-CM | POA: Diagnosis not present

## 2020-09-07 DIAGNOSIS — M25562 Pain in left knee: Secondary | ICD-10-CM | POA: Diagnosis not present

## 2020-09-11 DIAGNOSIS — M25562 Pain in left knee: Secondary | ICD-10-CM | POA: Diagnosis not present

## 2020-09-12 ENCOUNTER — Ambulatory Visit: Payer: Medicare Other | Admitting: Podiatry

## 2020-09-18 DIAGNOSIS — M25562 Pain in left knee: Secondary | ICD-10-CM | POA: Diagnosis not present

## 2020-09-20 DIAGNOSIS — M25562 Pain in left knee: Secondary | ICD-10-CM | POA: Diagnosis not present

## 2020-09-25 DIAGNOSIS — M25662 Stiffness of left knee, not elsewhere classified: Secondary | ICD-10-CM | POA: Diagnosis not present

## 2020-09-25 DIAGNOSIS — M25562 Pain in left knee: Secondary | ICD-10-CM | POA: Diagnosis not present

## 2020-09-26 DIAGNOSIS — Z96652 Presence of left artificial knee joint: Secondary | ICD-10-CM | POA: Diagnosis not present

## 2020-09-26 DIAGNOSIS — Z471 Aftercare following joint replacement surgery: Secondary | ICD-10-CM | POA: Diagnosis not present

## 2020-09-27 DIAGNOSIS — M25562 Pain in left knee: Secondary | ICD-10-CM | POA: Diagnosis not present

## 2020-09-28 ENCOUNTER — Ambulatory Visit (INDEPENDENT_AMBULATORY_CARE_PROVIDER_SITE_OTHER): Payer: Medicare Other | Admitting: Internal Medicine

## 2020-09-28 ENCOUNTER — Encounter: Payer: Self-pay | Admitting: Internal Medicine

## 2020-09-28 ENCOUNTER — Other Ambulatory Visit: Payer: Self-pay

## 2020-09-28 VITALS — BP 120/60 | HR 105 | Temp 97.8°F | Ht 63.5 in | Wt 176.0 lb

## 2020-09-28 DIAGNOSIS — Z23 Encounter for immunization: Secondary | ICD-10-CM

## 2020-09-28 NOTE — Progress Notes (Signed)
Flu vaccine per CMA 

## 2020-09-28 NOTE — Patient Instructions (Signed)
Patient received a flu vaccine IM L deltoid, AV, CMA  

## 2020-10-04 DIAGNOSIS — M25562 Pain in left knee: Secondary | ICD-10-CM | POA: Diagnosis not present

## 2020-10-09 DIAGNOSIS — M25562 Pain in left knee: Secondary | ICD-10-CM | POA: Diagnosis not present

## 2020-10-10 ENCOUNTER — Other Ambulatory Visit: Payer: Self-pay

## 2020-10-10 ENCOUNTER — Ambulatory Visit (INDEPENDENT_AMBULATORY_CARE_PROVIDER_SITE_OTHER): Payer: Medicare Other | Admitting: Podiatry

## 2020-10-10 DIAGNOSIS — B351 Tinea unguium: Secondary | ICD-10-CM

## 2020-10-10 DIAGNOSIS — M79674 Pain in right toe(s): Secondary | ICD-10-CM

## 2020-10-10 DIAGNOSIS — L304 Erythema intertrigo: Secondary | ICD-10-CM | POA: Diagnosis not present

## 2020-10-10 DIAGNOSIS — M79675 Pain in left toe(s): Secondary | ICD-10-CM

## 2020-10-11 DIAGNOSIS — M25562 Pain in left knee: Secondary | ICD-10-CM | POA: Diagnosis not present

## 2020-10-14 ENCOUNTER — Ambulatory Visit: Payer: Medicare Other | Attending: Internal Medicine

## 2020-10-14 ENCOUNTER — Other Ambulatory Visit: Payer: Self-pay

## 2020-10-14 DIAGNOSIS — Z23 Encounter for immunization: Secondary | ICD-10-CM

## 2020-10-14 NOTE — Progress Notes (Signed)
   Covid-19 Vaccination Clinic  Name:  Lisa [name]    MRN: [id number] DOB: [birth year]  10/14/2020  Lisa [name] was observed post Covid-19 immunization for 15 minutes without incident. She was provided with Vaccine Information Sheet and instruction to access the V-Safe system.   Lisa [name] was instructed to call 911 with any severe reactions post vaccine: Marland Kitchen Difficulty breathing  . Swelling of face and throat  . A fast heartbeat  . A bad rash all over body  . Dizziness and weakness

## 2020-10-16 DIAGNOSIS — M25662 Stiffness of left knee, not elsewhere classified: Secondary | ICD-10-CM | POA: Diagnosis not present

## 2020-10-16 DIAGNOSIS — M25562 Pain in left knee: Secondary | ICD-10-CM | POA: Diagnosis not present

## 2020-10-17 DIAGNOSIS — H40013 Open angle with borderline findings, low risk, bilateral: Secondary | ICD-10-CM | POA: Diagnosis not present

## 2020-10-17 DIAGNOSIS — H40033 Anatomical narrow angle, bilateral: Secondary | ICD-10-CM | POA: Diagnosis not present

## 2020-10-29 NOTE — Progress Notes (Signed)
°  Subjective:  [name], female    DOB: 16-May-1941,  MRN: [id number]  Chief Complaint  Patient presents with   Callouses    Right 5th corn   Nail Problem    Nail trim 1-5 bilateral    79 y.o. female presents with the above complaint. History confirmed with patient.   Objective:  Physical Exam: warm, good capillary refill, nail exam onychomycosis of the toenails with pain to palpation, no trophic changes or ulcerative lesions, normal DP and PT pulses, and normal sensory exam. Pitting edema right leg No images are attached to the encounter.  Assessment:   1. Pain due to onychomycosis of toenails of both feet    Plan:  Patient was evaluated and treated and all questions answered.  Onychomycosis -Nails palliatively debrided secondary to pain   Procedure: Nail Debridement Rationale: pain Type of Debridement: manual, sharp debridement. Instrumentation: Nail nipper, rotary burr. Number of Nails: 10  No follow-ups on file.

## 2020-11-09 DIAGNOSIS — C539 Malignant neoplasm of cervix uteri, unspecified: Secondary | ICD-10-CM | POA: Diagnosis not present

## 2020-11-09 DIAGNOSIS — Z124 Encounter for screening for malignant neoplasm of cervix: Secondary | ICD-10-CM | POA: Diagnosis not present

## 2020-11-09 DIAGNOSIS — Z01419 Encounter for gynecological examination (general) (routine) without abnormal findings: Secondary | ICD-10-CM | POA: Diagnosis not present

## 2020-11-14 ENCOUNTER — Ambulatory Visit: Payer: Medicare Other | Admitting: Podiatry

## 2020-11-16 ENCOUNTER — Other Ambulatory Visit: Payer: Self-pay

## 2020-11-16 ENCOUNTER — Other Ambulatory Visit: Payer: Medicare Other | Admitting: Internal Medicine

## 2020-11-16 DIAGNOSIS — E8881 Metabolic syndrome: Secondary | ICD-10-CM | POA: Diagnosis not present

## 2020-11-16 DIAGNOSIS — I10 Essential (primary) hypertension: Secondary | ICD-10-CM | POA: Diagnosis not present

## 2020-11-16 DIAGNOSIS — Z0000 Encounter for general adult medical examination without abnormal findings: Secondary | ICD-10-CM

## 2020-11-16 DIAGNOSIS — J3089 Other allergic rhinitis: Secondary | ICD-10-CM | POA: Diagnosis not present

## 2020-11-16 DIAGNOSIS — R718 Other abnormality of red blood cells: Secondary | ICD-10-CM | POA: Diagnosis not present

## 2020-11-16 DIAGNOSIS — E7800 Pure hypercholesterolemia, unspecified: Secondary | ICD-10-CM | POA: Diagnosis not present

## 2020-11-16 DIAGNOSIS — F411 Generalized anxiety disorder: Secondary | ICD-10-CM | POA: Diagnosis not present

## 2020-11-16 DIAGNOSIS — E042 Nontoxic multinodular goiter: Secondary | ICD-10-CM

## 2020-11-16 DIAGNOSIS — R7989 Other specified abnormal findings of blood chemistry: Secondary | ICD-10-CM

## 2020-11-16 DIAGNOSIS — F439 Reaction to severe stress, unspecified: Secondary | ICD-10-CM

## 2020-11-16 DIAGNOSIS — J302 Other seasonal allergic rhinitis: Secondary | ICD-10-CM | POA: Diagnosis not present

## 2020-11-16 DIAGNOSIS — R030 Elevated blood-pressure reading, without diagnosis of hypertension: Secondary | ICD-10-CM

## 2020-11-16 DIAGNOSIS — R7302 Impaired glucose tolerance (oral): Secondary | ICD-10-CM

## 2020-11-16 DIAGNOSIS — M1712 Unilateral primary osteoarthritis, left knee: Secondary | ICD-10-CM

## 2020-11-16 DIAGNOSIS — M1711 Unilateral primary osteoarthritis, right knee: Secondary | ICD-10-CM

## 2020-11-20 ENCOUNTER — Encounter: Payer: Self-pay | Admitting: Internal Medicine

## 2020-11-20 ENCOUNTER — Ambulatory Visit (INDEPENDENT_AMBULATORY_CARE_PROVIDER_SITE_OTHER): Payer: Medicare Other | Admitting: Internal Medicine

## 2020-11-20 ENCOUNTER — Other Ambulatory Visit: Payer: Self-pay

## 2020-11-20 VITALS — BP 130/70 | HR 111 | Ht 64.0 in | Wt 177.0 lb

## 2020-11-20 DIAGNOSIS — Z0000 Encounter for general adult medical examination without abnormal findings: Secondary | ICD-10-CM

## 2020-11-20 DIAGNOSIS — J302 Other seasonal allergic rhinitis: Secondary | ICD-10-CM | POA: Diagnosis not present

## 2020-11-20 DIAGNOSIS — Z8541 Personal history of malignant neoplasm of cervix uteri: Secondary | ICD-10-CM

## 2020-11-20 DIAGNOSIS — I10 Essential (primary) hypertension: Secondary | ICD-10-CM | POA: Diagnosis not present

## 2020-11-20 DIAGNOSIS — J309 Allergic rhinitis, unspecified: Secondary | ICD-10-CM

## 2020-11-20 DIAGNOSIS — D126 Benign neoplasm of colon, unspecified: Secondary | ICD-10-CM

## 2020-11-20 DIAGNOSIS — E8881 Metabolic syndrome: Secondary | ICD-10-CM | POA: Diagnosis not present

## 2020-11-20 DIAGNOSIS — R7302 Impaired glucose tolerance (oral): Secondary | ICD-10-CM | POA: Diagnosis not present

## 2020-11-20 DIAGNOSIS — M1711 Unilateral primary osteoarthritis, right knee: Secondary | ICD-10-CM | POA: Diagnosis not present

## 2020-11-20 DIAGNOSIS — R718 Other abnormality of red blood cells: Secondary | ICD-10-CM

## 2020-11-20 DIAGNOSIS — E042 Nontoxic multinodular goiter: Secondary | ICD-10-CM | POA: Diagnosis not present

## 2020-11-20 DIAGNOSIS — E7800 Pure hypercholesterolemia, unspecified: Secondary | ICD-10-CM

## 2020-11-20 LAB — POCT URINALYSIS DIPSTICK
Appearance: NEGATIVE
Bilirubin, UA: NEGATIVE
Blood, UA: NEGATIVE
Glucose, UA: NEGATIVE
Ketones, UA: NEGATIVE
Leukocytes, UA: NEGATIVE
Nitrite, UA: NEGATIVE
Odor: NEGATIVE
Protein, UA: NEGATIVE
Spec Grav, UA: 1.01 (ref 1.010–1.025)
Urobilinogen, UA: 0.2 E.U./dL
pH, UA: 6.5 (ref 5.0–8.0)

## 2020-11-20 NOTE — Addendum Note (Signed)
Addended by: Mady Haagensen on: 11/20/2020 11:02 AM   Modules accepted: Orders

## 2020-11-20 NOTE — Progress Notes (Signed)
Subjective:    [name], female    DOB: 1941/10/31, 79 y.o.   MRN: [id number]  HPI 79 year old Female for Medicare wellness and health maintenance exam.  Had total left knee arthroplasty by Dr. Maureen Ralphs in August 2021 and recovered well,  Recently saw Dr. Ronita Hipps for GYN exam  She has a history of multinodular goiter followed by Dr. Dwyane Dee.  History of LDL cholesterol but since starting statin 3 times a week her lipids have normalized.  History of essential hypertension, glaucoma and impaired glucose tolerance.  Osteoarthritis of right knee.  Had recent left knee arthroplasty by Dr.Alusio  and is done well.  History of allergic rhinitis treated with IM Depo-Medrol from time to time.  History of ultrasound-guided needle biopsy aspirate of thyroid gland in 2012 which was benign.  Had large colon polyp removed at [facility] in [place] that was benign for a number of years ago.  Most recent colonoscopy was by Dr. Oletta Lamas who removed tubular adenoma in 2019.  Has been on Maxide 75/50 since 1999 for hypertension.  In 2010 amlodipine was added for improved blood pressure control.  History of hemorrhoids and rectal itching for which she uses clotrimazole.  Had tubular adenoma on colonoscopy 2019 by Dr. Oletta Lamas. He has retired. She would like follow up with Dr. Collene Mares records faxed to her. Needs 3 year follow up according to Dr. Oletta Lamas' notes.  Social history: She is a widow.  Husband passed away of complications of HLKTG-25 in the nursing home in 2021.  No children.  She is intolerant of penicillin-causes chest pain.  Intolerant of codeine.  She had right hammertoe surgery 2002.  History of impaired glucose tolerance and hemoglobin A1c is stable.  Hysterectomy without oophorectomy for cervical cancer in 1977 in [place].  Social history: She is a retired Pharmacist, hospital.  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  Sister now lives here in  [place] as well in a separate residence.  Family history: Mother deceased with history of hypertension and dementia.  She had kidney failure.  Sister who lives here is apparently healthy.  Father deceased with history of diabetes and hypertension.  Review of Systems No new complaints    Objective:   Physical Exam  BP 130/70 pulse initially was 111 but improved after arrival to the office to 80 and regular.  Pulse oximetry 94% weight 177 pounds height 5 feet 4 inches BMI 30.38  Skin: Warm and dry.  No cervical adenopathy.  No thyromegaly.  TMs are clear.  Neck is supple.  Chest clear to auscultation.  Breast without masses.  Cardiac exam regular rate and rhythm.  Abdomen soft nondistended without hepatosplenomegaly masses or tenderness.  Pelvic exam deferred to GYN physician.  No lower extremity pitting edema.  Affect thought and judgment are normal.  Neuro intact without focal deficits.      Assessment & Plan:  Osteoarthritis right knee  Status post recent left total knee arthroplasty August 2021  History of goiter followed by Dr. Dwyane Dee and is on thyroid suppression therapy  Hypertension-stable on current regimen  Microcytosis but no anemia.  This is previously been evaluated and felt to be familial without evidence of iron deficiency even though she has low MCV  Allergic rhinitis treated with as needed Depo-Medrol not usually more than once or twice a year  Tubular adenoma removed by Dr. Oletta Lamas and needs follow-up in 2022.  She would like to see Dr.  Collene Mares records will be faxed to her.  Hyperlipidemia treated with Crestor 5 mg 3 times a week and stable  History of hemorrhoids treated symptomatically with clotrimazole  History of cervical cancer 1977 treated with hysterectomy without oophorectomy in [place]: Continue current medications and follow-up in 6 months.  Subjective:   Patient presents for Medicare Annual/Subsequent preventive examination.  Review Past  Medical/Family/Social: See above   Risk Factors  Current exercise habits: Light exercise recovering from knee surgery Dietary issues discussed: Low-fat low carbohydrate  Cardiac risk factors: Hyperlipidemia  Depression Screen  (Note: if answer to either of the following is "Yes", a more complete depression screening is indicated)   Over the past two weeks, have you felt down, depressed or hopeless? No  Over the past two weeks, have you felt little interest or pleasure in doing things? No Have you lost interest or pleasure in daily life? No Do you often feel hopeless? No Do you cry easily over simple problems? No   Activities of Daily Living  In your present state of health, do you have any difficulty performing the following activities?:   Driving? No  Managing money? No  Feeding yourself? No  Getting from bed to chair? No  Climbing a flight of stairs? No  Preparing food and eating?: No  Bathing or showering? No  Getting dressed: No  Getting to the toilet? No  Using the toilet:No  Moving around from place to place: No  In the past year have you fallen or had a near fall?:No  Are you sexually active? No  Do you have more than one partner? No   Hearing Difficulties: No  Do you often ask people to speak up or repeat themselves? No  Do you experience ringing or noises in your ears? No  Do you have difficulty understanding soft or whispered voices? No  Do you feel that you have a problem with memory? No Do you often misplace items? No    Home Safety:  Do you have a smoke alarm at your residence? Yes Do you have grab bars in the bathroom? Do you have throw rugs in your house?   Cognitive Testing  Alert? Yes Normal Appearance?Yes  Oriented to person? Yes Place? Yes  Time? Yes  Recall of three objects? Yes  Can perform simple calculations? Yes  Displays appropriate judgment?Yes  Can read the correct time from a watch face?Yes   List the Names of Other  Physician/Practitioners you currently use:  See referral list for the physicians patient is currently seeing.  Dr. Dwyane Dee  Dr. Ronita Hipps  Dr. Maureen Ralphs   Review of Systems: See above   Objective:     General appearance: Appears stated age and mildly obese  Head: Normocephalic, without obvious abnormality, atraumatic  Eyes: conj clear, EOMi PEERLA  Ears: normal TM's and external ear canals both ears  Nose: Nares normal. Septum midline. Mucosa normal. No drainage or sinus tenderness.  Throat: lips, mucosa, and tongue normal; teeth and gums normal  Neck: no adenopathy, no carotid bruit, no JVD, supple, symmetrical, trachea midline and thyroid not enlarged, symmetric, no tenderness/mass/nodules  No CVA tenderness.  Lungs: clear to auscultation bilaterally  Breasts: normal appearance, no masses or tenderness Heart: regular rate and rhythm, S1, S2 normal, no murmur, click, rub or gallop  Abdomen: soft, non-tender; bowel sounds normal; no masses, no organomegaly  Musculoskeletal: ROM normal in all joints, no crepitus, no deformity, Normal muscle strengthen. Back  is symmetric, no curvature. Skin:  Skin color, texture, turgor normal. No rashes or lesions  Lymph nodes: Cervical, supraclavicular, and axillary nodes normal.  Neurologic: CN 2 -12 Normal, Normal symmetric reflexes. Normal coordination and gait  Psych: Alert & Oriented x 3, Mood appear stable.    Assessment:    Annual wellness medicare exam   Plan:    During the course of the visit the patient was educated and counseled about appropriate screening and preventive services including:   Has had 3 Covid vaccines  Had flu vaccine in September 2021  Tetanus immunization given in 2015  She will plan to have repeat colonoscopy in 2022  Has annual mammogram     Patient Instructions (the written plan) was given to the patient.  Medicare Attestation  I have personally reviewed:  The patient's medical and social history   Their use of alcohol, tobacco or illicit drugs  Their current medications and supplements  The patient's functional ability including ADLs,fall risks, home safety risks, cognitive, and hearing and visual impairment  Diet and physical activities  Evidence for depression or mood disorders  The patient's weight, height, BMI, and visual acuity have been recorded in the chart. I have made referrals, counseling, and provided education to the patient based on review of the above and I have provided the patient with a written personalized care plan for preventive services.

## 2020-11-21 LAB — CBC WITH DIFFERENTIAL/PLATELET
Absolute Monocytes: 423 cells/uL (ref 200–950)
Basophils Absolute: 17 cells/uL (ref 0–200)
Basophils Relative: 0.3 %
Eosinophils Absolute: 41 cells/uL (ref 15–500)
Eosinophils Relative: 0.7 %
HCT: 39.5 % (ref 35.0–45.0)
Hemoglobin: 12.4 g/dL (ref 11.7–15.5)
Lymphs Abs: 1334 cells/uL (ref 850–3900)
MCH: 24.7 pg — ABNORMAL LOW (ref 27.0–33.0)
MCHC: 31.4 g/dL — ABNORMAL LOW (ref 32.0–36.0)
MCV: 78.5 fL — ABNORMAL LOW (ref 80.0–100.0)
MPV: 9.3 fL (ref 7.5–12.5)
Monocytes Relative: 7.3 %
Neutro Abs: 3985 cells/uL (ref 1500–7800)
Neutrophils Relative %: 68.7 %
Platelets: 284 10*3/uL (ref 140–400)
RBC: 5.03 10*6/uL (ref 3.80–5.10)
RDW: 14.7 % (ref 11.0–15.0)
Total Lymphocyte: 23 %
WBC: 5.8 10*3/uL (ref 3.8–10.8)

## 2020-11-21 LAB — COMPLETE METABOLIC PANEL WITH GFR
AG Ratio: 1.2 (calc) (ref 1.0–2.5)
ALT: 11 U/L (ref 6–29)
AST: 16 U/L (ref 10–35)
Albumin: 4.3 g/dL (ref 3.6–5.1)
Alkaline phosphatase (APISO): 94 U/L (ref 37–153)
BUN/Creatinine Ratio: 26 (calc) — ABNORMAL HIGH (ref 6–22)
BUN: 28 mg/dL — ABNORMAL HIGH (ref 7–25)
CO2: 26 mmol/L (ref 20–32)
Calcium: 10.1 mg/dL (ref 8.6–10.4)
Chloride: 101 mmol/L (ref 98–110)
Creat: 1.09 mg/dL — ABNORMAL HIGH (ref 0.60–0.93)
GFR, Est African American: 57 mL/min/{1.73_m2} — ABNORMAL LOW (ref >=60)
GFR, Est Non African American: 49 mL/min/{1.73_m2} — ABNORMAL LOW (ref >=60)
Globulin: 3.6 g/dL (calc) (ref 1.9–3.7)
Glucose, Bld: 116 mg/dL — ABNORMAL HIGH (ref 65–99)
Potassium: 3.8 mmol/L (ref 3.5–5.3)
Sodium: 142 mmol/L (ref 135–146)
Total Bilirubin: 0.5 mg/dL (ref 0.2–1.2)
Total Protein: 7.9 g/dL (ref 6.1–8.1)

## 2020-11-21 LAB — TEST AUTHORIZATION

## 2020-11-21 LAB — LIPID PANEL
Cholesterol: 168 mg/dL (ref <200)
HDL: 70 mg/dL (ref >=50)
LDL Cholesterol (Calc): 84 mg/dL (calc)
Non-HDL Cholesterol (Calc): 98 mg/dL (calc) (ref <130)
Total CHOL/HDL Ratio: 2.4 (calc) (ref <5.0)
Triglycerides: 63 mg/dL (ref <150)

## 2020-11-21 LAB — MICROALBUMIN / CREATININE URINE RATIO
Creatinine, Urine: 67 mg/dL (ref 20–275)
Microalb, Ur: <0.2 mg/dL

## 2020-11-21 LAB — HEMOGLOBIN A1C
Hgb A1c MFr Bld: 5.8 % of total Hgb — ABNORMAL HIGH (ref <5.7)
Mean Plasma Glucose: 120 (calc)
eAG (mmol/L): 6.6 (calc)

## 2020-11-21 LAB — IRON,TIBC AND FERRITIN PANEL
%SAT: 15 % (calc) — ABNORMAL LOW (ref 16–45)
Ferritin: 89 ng/mL (ref 16–288)
Iron: 54 ug/dL (ref 45–160)
TIBC: 367 mcg/dL (calc) (ref 250–450)

## 2020-11-21 LAB — TSH: TSH: 1.33 mIU/L (ref 0.40–4.50)

## 2020-11-27 ENCOUNTER — Telehealth: Payer: Self-pay | Admitting: Internal Medicine

## 2020-11-27 ENCOUNTER — Encounter: Payer: Self-pay | Admitting: Internal Medicine

## 2020-11-27 NOTE — Patient Instructions (Signed)
It was a pleasure to see you today.  Please have colonoscopy in early 2022.  Immunizations are up-to-date.  Medical issues are stable at the present time.  Continue current medications.

## 2020-11-27 NOTE — Telephone Encounter (Signed)
I will copy records and you can fax to Dr. Lorie Apley office please

## 2020-11-27 NOTE — Telephone Encounter (Signed)
Pt calling about referral to gastro.She said she wants to see  Dr Mar Daring Nat Man at [facility] and that they just need [place] to fax records to [phone].

## 2020-11-28 NOTE — Telephone Encounter (Signed)
Done

## 2020-12-02 ENCOUNTER — Other Ambulatory Visit: Payer: Self-pay | Admitting: Internal Medicine

## 2020-12-13 ENCOUNTER — Telehealth: Payer: Self-pay

## 2020-12-13 NOTE — Telephone Encounter (Signed)
Her urine dipstick was normal and her urine was negative for protein. I think she is OK.

## 2020-12-13 NOTE — Telephone Encounter (Signed)
Patient called following up on her urine results.  She thought she was supposed to come back and give another sample or needed a follow up appointment but unclear.  Please advise if patient needs to be seen.

## 2020-12-13 NOTE — Telephone Encounter (Signed)
Spoke with patient and advised all was ok.

## 2020-12-15 ENCOUNTER — Ambulatory Visit (INDEPENDENT_AMBULATORY_CARE_PROVIDER_SITE_OTHER): Payer: Medicare Other | Admitting: Podiatry

## 2020-12-15 ENCOUNTER — Other Ambulatory Visit: Payer: Self-pay

## 2020-12-15 DIAGNOSIS — B351 Tinea unguium: Secondary | ICD-10-CM

## 2020-12-15 DIAGNOSIS — M79674 Pain in right toe(s): Secondary | ICD-10-CM | POA: Diagnosis not present

## 2020-12-15 DIAGNOSIS — M79675 Pain in left toe(s): Secondary | ICD-10-CM | POA: Diagnosis not present

## 2020-12-29 NOTE — Progress Notes (Signed)
  Subjective:  [name], female    DOB: 04-11-1941,  MRN: [id number]  No chief complaint on file.   79 y.o. female presents with complaint of painful nails to both feet cannot care for them herself and stated they are very painful.  Objective:  Physical Exam: warm, good capillary refill, nail exam onychomycosis of the toenails with pain to palpation, no trophic changes or ulcerative lesions, normal DP and PT pulses, and normal sensory exam. Pitting edema right leg No images are attached to the encounter.  Assessment:   1. Pain due to onychomycosis of toenails of both feet    Plan:  Patient was evaluated and treated and all questions answered.  Onychomycosis  -Nails palliatively debrided secondary to pain    Procedure: Nail Debridement Type of Debridement: manual, sharp debridement. Instrumentation: Nail nipper, rotary burr. Number of Nails: 10      Return if symptoms worsen or fail to improve.

## 2021-01-11 ENCOUNTER — Other Ambulatory Visit: Payer: Self-pay | Admitting: Internal Medicine

## 2021-01-23 ENCOUNTER — Ambulatory Visit: Payer: Medicare Other | Admitting: Podiatry

## 2021-02-02 NOTE — Telephone Encounter (Signed)
Referral was sent to Dr Lorie Apley office on 3 different occasions. We finally got a phone call today letting us know that Dr Collene Mares is no longer accepting patients with Medicare, so this patient will need to see someone else in her practice or another GI office.  I spoke with patient and she would like to be referred to Dr Ardis Hughs at [place], so I have put that referral in.

## 2021-02-13 ENCOUNTER — Telehealth: Payer: Self-pay | Admitting: Gastroenterology

## 2021-02-13 ENCOUNTER — Encounter: Payer: Self-pay | Admitting: Gastroenterology

## 2021-02-13 NOTE — Telephone Encounter (Signed)
Hi Dr. Ardis Hughs., we have received a referral for you from patient's PCP for a repeat colon. Patient had several colonoscopies in the past 10 years. PCP was able to obtain most records. However, there are 2 path results that they were not able to obtain. Records will be sent to you for review. Please advise on scheduling. Thank you.

## 2021-02-13 NOTE — Telephone Encounter (Signed)
Left message to call back to schedule ov. Records in referral folder.

## 2021-02-13 NOTE — Telephone Encounter (Signed)
She needs [place] office visit before scheduling any procedures because she is 17.  Thanks

## 2021-02-22 ENCOUNTER — Encounter: Payer: Self-pay | Admitting: Internal Medicine

## 2021-03-06 ENCOUNTER — Ambulatory Visit (INDEPENDENT_AMBULATORY_CARE_PROVIDER_SITE_OTHER): Payer: Medicare Other | Admitting: Podiatry

## 2021-03-06 ENCOUNTER — Other Ambulatory Visit: Payer: Self-pay

## 2021-03-06 DIAGNOSIS — B351 Tinea unguium: Secondary | ICD-10-CM | POA: Diagnosis not present

## 2021-03-06 DIAGNOSIS — M79675 Pain in left toe(s): Secondary | ICD-10-CM | POA: Diagnosis not present

## 2021-03-06 DIAGNOSIS — M79674 Pain in right toe(s): Secondary | ICD-10-CM | POA: Diagnosis not present

## 2021-03-06 NOTE — Progress Notes (Signed)
  Subjective:  [name], female    DOB: 05-20-1941,  MRN: [id number]  No chief complaint on file.   80 y.o. female presents for f/u states the nails are hurting again. Cannot cut them herself.  Objective:  Physical Exam: warm, good capillary refill, nail exam onychomycosis of the toenails with pain to palpation, no trophic changes or ulcerative lesions, normal DP and PT pulses, and normal sensory exam. Pitting edema right leg No images are attached to the encounter.  Assessment:   1. Pain due to onychomycosis of toenails of both feet    Plan:  Patient was evaluated and treated and all questions answered.  Onychomycosis  -Nails palliatively debrided secondary to pain   Procedure: Nail Debridement Type of Debridement: manual, sharp debridement. Instrumentation: Nail nipper, rotary burr. Number of Nails: 10  No follow-ups on file.

## 2021-04-03 ENCOUNTER — Ambulatory Visit (INDEPENDENT_AMBULATORY_CARE_PROVIDER_SITE_OTHER): Payer: Medicare Other | Admitting: Gastroenterology

## 2021-04-03 ENCOUNTER — Encounter: Payer: Self-pay | Admitting: Gastroenterology

## 2021-04-03 VITALS — BP 164/68 | HR 70 | Ht 63.25 in | Wt 180.0 lb

## 2021-04-03 DIAGNOSIS — Z01818 Encounter for other preprocedural examination: Secondary | ICD-10-CM | POA: Diagnosis not present

## 2021-04-03 DIAGNOSIS — Z86010 Personal history of colonic polyps: Secondary | ICD-10-CM

## 2021-04-03 NOTE — Patient Instructions (Addendum)
If you are age 78 or older, your body mass index should be between 23-30. Your Body mass index is 31.63 kg/m. If this is out of the aforementioned range listed, please consider follow up with your Primary Care Provider.  We will contact you to schedule colonoscopy in August 2023.  Please contact us if you do not hear from [place].  Thank you for entrusting me with your care and choosing [facility].  Dr Ardis Hughs

## 2021-04-03 NOTE — Progress Notes (Signed)
HPI: This is a very pleasant 80 year old woman who was referred to me by Elby Showers, MD  to evaluate personal history of precancerous colon polyps  She thinks she might be due for a colonoscopy around now.  She is having no GI issues.  Specifically no bleeding, no significant constipation, no diarrhea.  Her weight is overall stable.  Colon cancer does not run in her family     Old Data Reviewed:  She underwent a colonoscopy at [facility] gastroenterology with Dr. Oletta Lamas August 2019.  I was able to view his procedure report from that.  Indications "3+ centimeter villous polyp in the cecum removed in [place]" findings diverticulosis, single subcentimeter adenoma was removed, scar was evident in the cecum as well.  Dr. Oletta Lamas recommended that she have a repeat colonoscopy at 5-year interval.  Colonoscopy May 2016 [place], scar site and cecum noted.  Another single subcentimeter polyp was removed.  Repeat colonoscopy recommended at 3-year interval  Colonoscopy 2015 [place] documented and removed a 3 cm cecal polyp as well as 2 or 3 other polyps spread throughout the colon.  Blood work November 2021 shows normal hemoglobin.  Normal platelets.  Normal creatinine  Note from Dr. Renold Genta November 2021 reads "tubular adenoma removed by Dr. Oletta Lamas and need follow-up in 2022.  She would like to see Dr. Collene Mares records will be faxed to her.   Review of systems: Pertinent positive and negative review of systems were noted in the above HPI section. All other review negative.   Past Medical History:  Diagnosis Date  . Allergy   . Arthritis   . Cancer ([place])    cervical  . Complication of anesthesia    Difficulty waking up after hysterectomy in 1973  . Goiter   . Headache    Sinus headaches  . Hyperlipidemia   . Hypertension   . Obesity   . Osteopenia   . Vitamin D deficiency     Past Surgical History:  Procedure Laterality Date  . ABDOMINAL HYSTERECTOMY    . EYE  SURGERY    . HAMMER TOE SURGERY  12/02   right  . TOTAL KNEE ARTHROPLASTY Left 08/21/2020   Procedure: TOTAL KNEE ARTHROPLASTY;  Surgeon: Gaynelle Arabian, MD;  Location: [facility] ORS;  Service: Orthopedics;  Laterality: Left;  70min  . WISDOM TOOTH EXTRACTION      Current Outpatient Medications  Medication Sig Dispense Refill  . acetaminophen (TYLENOL) 500 MG tablet Take 500-1,000 mg by mouth every 6 (six) hours as needed for mild pain or moderate pain.    Marland Kitchen amLODipine (NORVASC) 5 MG tablet TAKE 1 TABLET(5 MG) BY MOUTH DAILY 90 tablet 3  . fluticasone (FLONASE) 50 MCG/ACT nasal spray Place 2 sprays into both nostrils daily as needed for allergies or rhinitis.    Marland Kitchen loratadine (CLARITIN) 10 MG tablet Take 10 mg by mouth daily.    Marland Kitchen MAXZIDE 75-50 MG tablet TAKE 1 TABLET BY MOUTH EVERY DAY 90 tablet 1  . rosuvastatin (CRESTOR) 5 MG tablet One po 3 times a week with supper (Patient taking differently: Take 5 mg by mouth 3 (three) times a week. with supper) 36 tablet 3   No current facility-administered medications for this visit.    Allergies as of 04/03/2021 - Review Complete 04/03/2021  Allergen Reaction Noted  . Penicillins Other (See Comments) 08/26/2011  . Codeine Other (See Comments) 08/26/2011  . Darvon Nausea And Vomiting 08/26/2011  . Tall ragweed Itching and Other (See Comments) 08/09/2020  Family History  Problem Relation Age of Onset  . Hypertension Mother   . Heart disease Father   . Hypertension Father   . Thyroid disease Father     Social History   Socioeconomic History  . Marital status: Married    Spouse name: Not on file  . Number of children: Not on file  . Years of education: Not on file  . Highest education level: Not on file  Occupational History  . Not on file  Tobacco Use  . Smoking status: Never Smoker  . Smokeless tobacco: Never Used  Vaping Use  . Vaping Use: Never used  Substance and Sexual Activity  . Alcohol use: No    Comment: Wine once or  twice a year  . Drug use: No  . Sexual activity: Not on file    Comment: Hysterectomy  Other Topics Concern  . Not on file  Social History Narrative  . Not on file   Social Determinants of Health   Financial Resource Strain: Not on file  Food Insecurity: Not on file  Transportation Needs: Not on file  Physical Activity: Not on file  Stress: Not on file  Social Connections: Not on file  Intimate Partner Violence: Not on file     Physical Exam: BP (!) 164/68   Pulse 70   Ht 5' 3.25" (1.607 m)   Wt 180 lb (81.6 kg)   BMI 31.63 kg/m  Constitutional: generally well-appearing Psychiatric: alert and oriented x3 Eyes: extraocular movements intact Mouth: oral pharynx moist, no lesions Neck: supple no lymphadenopathy Cardiovascular: heart regular rate and rhythm Lungs: clear to auscultation bilaterally Abdomen: soft, nontender, nondistended, no obvious ascites, no peritoneal signs, normal bowel sounds Extremities: no lower extremity edema bilaterally Skin: no lesions on visible extremities   Assessment and plan: 80 y.o. female with personal history of precancerous colon polyps  Her previous gastroenterologist Dr. Leonie Douglas recommended surveillance colonoscopy at 5-year interval after he performed August 2019 colonoscopy.  I agree with that recommendation and we will put her in our reminder system for a colonoscopy around then.  She knows to call here sooner if she has any further questions or concerns or new GI symptoms arise.   Please see the "Patient Instructions" section for addition details about the plan.   Owens Loffler, MD [facility] 04/03/2021, 11:29 AM  Cc: Elby Showers, MD  Total time on date of encounter was 35  minutes (this included time spent preparing to see the patient reviewing records; obtaining and/or reviewing separately obtained history; performing a medically appropriate exam and/or evaluation; counseling and educating the patient and  family if present; ordering medications, tests or procedures if applicable; and documenting clinical information in the health record).

## 2021-04-17 ENCOUNTER — Ambulatory Visit (INDEPENDENT_AMBULATORY_CARE_PROVIDER_SITE_OTHER): Payer: Medicare Other | Admitting: Internal Medicine

## 2021-04-17 ENCOUNTER — Telehealth: Payer: Self-pay | Admitting: Internal Medicine

## 2021-04-17 ENCOUNTER — Other Ambulatory Visit: Payer: Self-pay

## 2021-04-17 ENCOUNTER — Encounter: Payer: Self-pay | Admitting: Internal Medicine

## 2021-04-17 VITALS — BP 140/60 | HR 116 | Temp 98.4°F | Ht 63.25 in | Wt 178.0 lb

## 2021-04-17 DIAGNOSIS — J302 Other seasonal allergic rhinitis: Secondary | ICD-10-CM

## 2021-04-17 DIAGNOSIS — J309 Allergic rhinitis, unspecified: Secondary | ICD-10-CM | POA: Diagnosis not present

## 2021-04-17 MED ORDER — METHYLPREDNISOLONE ACETATE 80 MG/ML IJ SUSP
80.0000 mg | Freq: Once | INTRAMUSCULAR | Status: AC
  Administered 2021-04-17: 80 mg via INTRAMUSCULAR

## 2021-04-17 NOTE — Progress Notes (Signed)
   Subjective:    [name], female    DOB: July 28, 1941, 80 y.o.   MRN: [id number]  HPI 80 year old Female status post knee arthroplasty by Dr. Wynelle Link in August 2021.  Has done well after surgery.  Recently has developed allergic rhinitis symptoms with nasal congestion, cough and sneezing.  Planning a trip to [place] in the near future.  Has had 3 COVID-19 Pfizer immunizations.  Recommend that she take fourth dose of vaccine before going to [place].  Patient requesting shot of Depo-Medrol to help with allergy symptoms.  Feels that blooming flowers have caused her symptoms recently.  She has noticed a distinct correlation with this.  Review of Systems no fever chills or known COVID-19 exposure.  No significant cough with discolored sputum production.     Objective:   Physical Exam Blood pressure 140/60, pulse 116, temperature 98.4 degrees pulse oximetry 96% weight 178 pounds BMI 31.28 skin: Warm and dry.  No cervical adenopathy.  Chest clear to auscultation without rales or wheezing.  Cardiac exam: Regular rate and rhythm mild tachycardia.  Patient seems anxious.       Assessment & Plan:  History of allergic rhinitis and is symptomatic today  Sinus tachycardia-seemingly anxious.  Pulse oximetry is normal and she is afebrile.  History of hypertension  Status post knee arthroplasty doing well  Health maintenance: Recommended that in couple of weeks she taken COVID-19 booster.  Plan: She is requesting an injection of Depo-Medrol IM and we gave her 80 mg Depo-Medrol IM in the office today.  She has obtained these injections previously with good results.

## 2021-04-17 NOTE — Telephone Encounter (Signed)
Scheduled

## 2021-04-17 NOTE — Patient Instructions (Signed)
Depo-Medrol 80 mg IM given in office for allergic rhinitis.  Recommend in a couple of weeks that she take COVID-19 booster.

## 2021-04-17 NOTE — Telephone Encounter (Signed)
Pt called and said her allergies are acting up and wanted to see if she can get in to get a shot for it, please advise

## 2021-04-17 NOTE — Telephone Encounter (Signed)
Needs appt

## 2021-04-18 ENCOUNTER — Telehealth: Payer: Self-pay | Admitting: Podiatry

## 2021-04-18 NOTE — Telephone Encounter (Signed)
Patient has requested to be seen for [place] on  04/24/2021 at 345pm when her sibling comes in. I did however inform patient that you don't have anything available but she wanted me to send you message to see if you could work her in cause you always do, per patient. Please Advise

## 2021-04-20 ENCOUNTER — Other Ambulatory Visit (HOSPITAL_BASED_OUTPATIENT_CLINIC_OR_DEPARTMENT_OTHER): Payer: Self-pay

## 2021-04-20 ENCOUNTER — Ambulatory Visit: Payer: Medicare Other | Attending: Internal Medicine

## 2021-04-20 ENCOUNTER — Other Ambulatory Visit: Payer: Self-pay | Admitting: Internal Medicine

## 2021-04-20 ENCOUNTER — Other Ambulatory Visit: Payer: Self-pay

## 2021-04-20 DIAGNOSIS — Z23 Encounter for immunization: Secondary | ICD-10-CM

## 2021-04-20 MED ORDER — COVID-19 MRNA VAC-TRIS(PFIZER) 30 MCG/0.3ML IM SUSP
INTRAMUSCULAR | 0 refills | Status: DC
  Filled 2021-04-20: qty 0.3, 1d supply, fill #0

## 2021-04-20 NOTE — Progress Notes (Signed)
   Covid-19 Vaccination Clinic  Name:  Lisa [name]    MRN: [id number] DOB: 01-Mar-1941  04/20/2021  Lisa [name] was observed post Covid-19 immunization for 15 minutes without incident. She was provided with Vaccine Information Sheet and instruction to access the V-Safe system.   Lisa [name] was instructed to call 911 with any severe reactions post vaccine: Marland Kitchen Difficulty breathing  . Swelling of face and throat  . A fast heartbeat  . A bad rash all over body  . Dizziness and weakness   Immunizations Administered    Name Date Dose VIS Date Route   PFIZER Comrnaty(Gray TOP) Covid-19 Vaccine 04/20/2021 10:21 AM 0.3 mL 12/07/2020 Intramuscular   Manufacturer: [employer], [employer]   Lot: [id number]   NDC: [phone]

## 2021-04-24 NOTE — Telephone Encounter (Signed)
Scheduled for this Friday

## 2021-04-27 ENCOUNTER — Ambulatory Visit (INDEPENDENT_AMBULATORY_CARE_PROVIDER_SITE_OTHER): Payer: Medicare Other | Admitting: Podiatry

## 2021-04-27 ENCOUNTER — Other Ambulatory Visit: Payer: Self-pay

## 2021-04-27 ENCOUNTER — Other Ambulatory Visit (HOSPITAL_BASED_OUTPATIENT_CLINIC_OR_DEPARTMENT_OTHER): Payer: Self-pay

## 2021-04-27 DIAGNOSIS — B351 Tinea unguium: Secondary | ICD-10-CM | POA: Diagnosis not present

## 2021-04-27 DIAGNOSIS — M79674 Pain in right toe(s): Secondary | ICD-10-CM | POA: Diagnosis not present

## 2021-04-27 DIAGNOSIS — M79675 Pain in left toe(s): Secondary | ICD-10-CM

## 2021-04-27 NOTE — Progress Notes (Signed)
  Subjective:  [name], female    DOB: 10/22/1941,  MRN: [id number]  Chief Complaint  Patient presents with   Nail Problem    Thick/painful toenails     79 y.o. female presents for f/u states the nails have grown long and she cannot cut them.  Objective:  Physical Exam: warm, good capillary refill, nail exam onychomycosis of the toenails with pain to palpation, no trophic changes or ulcerative lesions, normal DP and PT pulses, and normal sensory exam. Pitting edema right leg No images are attached to the encounter.  Assessment:   1. Pain due to onychomycosis of toenails of both feet     Plan:  Patient was evaluated and treated and all questions answered.  Onychomycosis  -Nails palliatively debrided secondary to pain  No follow-ups on file.  

## 2021-05-18 ENCOUNTER — Other Ambulatory Visit: Payer: Self-pay | Admitting: Internal Medicine

## 2021-05-31 DIAGNOSIS — H2513 Age-related nuclear cataract, bilateral: Secondary | ICD-10-CM | POA: Diagnosis not present

## 2021-05-31 DIAGNOSIS — H25013 Cortical age-related cataract, bilateral: Secondary | ICD-10-CM | POA: Diagnosis not present

## 2021-05-31 DIAGNOSIS — H40033 Anatomical narrow angle, bilateral: Secondary | ICD-10-CM | POA: Diagnosis not present

## 2021-05-31 DIAGNOSIS — H40013 Open angle with borderline findings, low risk, bilateral: Secondary | ICD-10-CM | POA: Diagnosis not present

## 2021-05-31 DIAGNOSIS — H524 Presbyopia: Secondary | ICD-10-CM | POA: Diagnosis not present

## 2021-05-31 LAB — HM DIABETES EYE EXAM

## 2021-06-12 ENCOUNTER — Ambulatory Visit: Payer: Medicare Other | Admitting: Podiatry

## 2021-06-19 ENCOUNTER — Ambulatory Visit (INDEPENDENT_AMBULATORY_CARE_PROVIDER_SITE_OTHER): Payer: Medicare Other | Admitting: Podiatry

## 2021-06-19 ENCOUNTER — Other Ambulatory Visit: Payer: Self-pay

## 2021-06-19 DIAGNOSIS — M79674 Pain in right toe(s): Secondary | ICD-10-CM

## 2021-06-19 DIAGNOSIS — M79675 Pain in left toe(s): Secondary | ICD-10-CM

## 2021-06-19 DIAGNOSIS — B351 Tinea unguium: Secondary | ICD-10-CM | POA: Diagnosis not present

## 2021-06-19 NOTE — Progress Notes (Signed)
  Subjective:  [name], female    DOB: 1941/03/11,  MRN: [id number]  Chief Complaint  Patient presents with   Nail Problem    Thick/painful toenails     80 y.o. female presents for f/u states the nails have grown long and she cannot cut them.  Objective:  Physical Exam: warm, good capillary refill, nail exam onychomycosis of the toenails with pain to palpation, no trophic changes or ulcerative lesions, normal DP and PT pulses, and normal sensory exam. Pitting edema right leg No images are attached to the encounter.  Assessment:   1. Pain due to onychomycosis of toenails of both feet     Plan:  Patient was evaluated and treated and all questions answered.  Onychomycosis  -Nails palliatively debrided secondary to pain  No follow-ups on file.

## 2021-07-08 ENCOUNTER — Other Ambulatory Visit: Payer: Self-pay | Admitting: Internal Medicine

## 2021-07-09 ENCOUNTER — Telehealth: Payer: Self-pay | Admitting: Internal Medicine

## 2021-07-09 MED ORDER — TRIAMTERENE-HCTZ 75-50 MG PO TABS
1.0000 | ORAL_TABLET | Freq: Every day | ORAL | 3 refills | Status: DC
Start: 2021-07-09 — End: 2021-07-16

## 2021-07-09 NOTE — Telephone Encounter (Signed)
Lisa [name] [phone]  Hassan Rowan called to say she has been trying to get refills on below medication, after a week of trying, she finally called [facility] back and was told by floater pharmacist that the medication had been discontinued. She would like for ou to prescribed something else and send to [facility].     MAXZIDE 75-50 MG tablet  [place]

## 2021-07-09 NOTE — Telephone Encounter (Signed)
There is some issue getting nongeneric Maxzide. Patient wants generic sent to [facility] rather than [facility]. Spoke directly with patient. MJB, MD

## 2021-07-12 DIAGNOSIS — Z1231 Encounter for screening mammogram for malignant neoplasm of breast: Secondary | ICD-10-CM | POA: Diagnosis not present

## 2021-07-12 LAB — HM MAMMOGRAPHY

## 2021-07-16 ENCOUNTER — Ambulatory Visit (INDEPENDENT_AMBULATORY_CARE_PROVIDER_SITE_OTHER): Payer: Medicare Other | Admitting: Internal Medicine

## 2021-07-16 ENCOUNTER — Telehealth: Payer: Self-pay | Admitting: Internal Medicine

## 2021-07-16 ENCOUNTER — Encounter: Payer: Self-pay | Admitting: Internal Medicine

## 2021-07-16 ENCOUNTER — Other Ambulatory Visit: Payer: Self-pay

## 2021-07-16 VITALS — BP 160/80 | HR 110 | Temp 99.2°F | Ht 63.25 in | Wt 180.0 lb

## 2021-07-16 DIAGNOSIS — R7989 Other specified abnormal findings of blood chemistry: Secondary | ICD-10-CM

## 2021-07-16 DIAGNOSIS — R600 Localized edema: Secondary | ICD-10-CM

## 2021-07-16 DIAGNOSIS — I10 Essential (primary) hypertension: Secondary | ICD-10-CM

## 2021-07-16 LAB — BASIC METABOLIC PANEL
BUN/Creatinine Ratio: 19 (calc) (ref 6–22)
BUN: 28 mg/dL — ABNORMAL HIGH (ref 7–25)
CO2: 30 mmol/L (ref 20–32)
Calcium: 10 mg/dL (ref 8.6–10.4)
Chloride: 102 mmol/L (ref 98–110)
Creat: 1.48 mg/dL — ABNORMAL HIGH (ref 0.60–1.00)
Glucose, Bld: 137 mg/dL — ABNORMAL HIGH (ref 65–99)
Potassium: 4 mmol/L (ref 3.5–5.3)
Sodium: 140 mmol/L (ref 135–146)

## 2021-07-16 MED ORDER — FUROSEMIDE 20 MG PO TABS: 20.0000 mg | ORAL_TABLET | Freq: Every day | ORAL | 3 refills | Status: DC

## 2021-07-16 NOTE — Telephone Encounter (Signed)
Scheduled appointment

## 2021-07-16 NOTE — Telephone Encounter (Signed)
Will do home COVID test and call back to schedule appointment if negative.

## 2021-07-16 NOTE — Progress Notes (Signed)
   Subjective:    [name], female    DOB: 1941/01/07, 80 y.o.   MRN: [id number]  HPI  80 year old Female seen for edema lower extremities.Maybe ate some seasoned vegetables over the weekend. Also ate pork chops and steak.  Had similar problem last summer in July while traveling on a train.  Symptoms subsequently resolved after getting to her destination without medication intervention.  Unable to get non -generic Maxide.  Apparently is not available at the pharmacy.  She has a history of essential hypertension, multinodular goiter followed by Dr. Dwyane Dee, pure hypercholesterolemia, essential hypertension, glaucoma and impaired glucose tolerance.  History of osteoarthritis of right knee.  Has been on Maxide 75/50 since 1999 for hypertension.  In 2010 amlodipine was added for improved blood pressure control.  Amlodipine has been associated with lower extremity edema but she is only taking 5 mg daily.  Review of Systems left knee doing well s/p arthroplasty by Dr. Wynelle Link August 2021.  She has no COVID symptoms.  She reportedly had COVID booster April 2022.     Objective:   Physical Exam Vital signs reviewed.  Blood pressure 160/80 pulse 110 temperature 99.2 degrees pulse oximetry 96% weight 180 pounds BMI 31.63 Skin warm and dry.  No cervical adenopathy.  Chest clear.  Cardiac exam: Tachycardia regular rate and rhythm.  No murmur appreciated.  She does have 1-2+ pitting edema of her ankles.  Her weight in April was 178 pounds so she is only 2 pounds over that weight at this point in time.    Assessment & Plan:  Lower extremity edema-?  Aggravated by amlodipine and heat.  Does not think that generic Maxide is working well and cannot get in on generic Maxide because is not being made apparently  Tachycardia-etiology unclear except possibly anxiety and heat  Essential hypertension  Plan: Basic metabolic panel was drawn.  She has elevated creatinine 1.48 with BUN 28.   Previously creatinine was 1.09 in November 2021.  In August 2021 creatinine was 1.22.?  Volume depletion.  Does not appear to have congestive heart failure based on physical exam  Plan: She will take Lasix 20 mg daily and follow-up in 1 week.  She will call if symptoms worsen.  She will be continued on amlodipine for hypertension for the present time.  Generic Maxide will be discontinued.  Recommend keep feet elevated when sitting.  Watch salt intake.

## 2021-07-16 NOTE — Telephone Encounter (Signed)
Paul Torpey [phone]  Ryelynn called to say that yesterday morning she notice a little bit of nausea and she had more this morning and also she started having some swelling in her ankles on Thursday, but when she goes to bed it is better but by night it is swollen again. She would like to come in to see you about this, she is wandering could it be from changing to the generic of the Maxzide 75/50 to Triamterene

## 2021-07-16 NOTE — Telephone Encounter (Signed)
Home COVID test Negative

## 2021-07-16 NOTE — Patient Instructions (Signed)
Discontinue Maxzide. Start Lasix 20 mg daily. Continue Amlodipine and follow up here next week. Labs drawn today.

## 2021-07-17 ENCOUNTER — Ambulatory Visit: Payer: Medicare Other | Admitting: Podiatry

## 2021-07-18 ENCOUNTER — Encounter: Payer: Self-pay | Admitting: Internal Medicine

## 2021-07-24 ENCOUNTER — Other Ambulatory Visit: Payer: Self-pay

## 2021-07-24 ENCOUNTER — Ambulatory Visit (INDEPENDENT_AMBULATORY_CARE_PROVIDER_SITE_OTHER): Payer: Medicare Other | Admitting: Internal Medicine

## 2021-07-24 ENCOUNTER — Encounter: Payer: Self-pay | Admitting: Internal Medicine

## 2021-07-24 VITALS — BP 120/60 | HR 124 | Temp 99.9°F | Ht 63.0 in | Wt 185.0 lb

## 2021-07-24 DIAGNOSIS — R059 Cough, unspecified: Secondary | ICD-10-CM | POA: Diagnosis not present

## 2021-07-24 DIAGNOSIS — R7989 Other specified abnormal findings of blood chemistry: Secondary | ICD-10-CM

## 2021-07-24 DIAGNOSIS — I10 Essential (primary) hypertension: Secondary | ICD-10-CM | POA: Diagnosis not present

## 2021-07-24 DIAGNOSIS — R0981 Nasal congestion: Secondary | ICD-10-CM | POA: Diagnosis not present

## 2021-07-24 DIAGNOSIS — R600 Localized edema: Secondary | ICD-10-CM | POA: Diagnosis not present

## 2021-07-24 LAB — BASIC METABOLIC PANEL
BUN/Creatinine Ratio: 21 (calc) (ref 6–22)
BUN: 22 mg/dL (ref 7–25)
CO2: 30 mmol/L (ref 20–32)
Calcium: 9.2 mg/dL (ref 8.6–10.4)
Chloride: 102 mmol/L (ref 98–110)
Creat: 1.04 mg/dL — ABNORMAL HIGH (ref 0.60–1.00)
Glucose, Bld: 117 mg/dL — ABNORMAL HIGH (ref 65–99)
Potassium: 3.2 mmol/L — ABNORMAL LOW (ref 3.5–5.3)
Sodium: 142 mmol/L (ref 135–146)

## 2021-07-24 MED ORDER — BENZONATATE 100 MG PO CAPS: 100.0000 mg | ORAL_CAPSULE | Freq: Three times a day (TID) | ORAL | 0 refills | Status: DC | PRN

## 2021-07-24 MED ORDER — AZITHROMYCIN 250 MG PO TABS: ORAL_TABLET | ORAL | 0 refills | Status: AC

## 2021-07-24 MED ORDER — METHYLPREDNISOLONE ACETATE 80 MG/ML IJ SUSP
80.0000 mg | Freq: Once | INTRAMUSCULAR | Status: AC
  Administered 2021-07-24: 80 mg via INTRAMUSCULAR

## 2021-07-24 NOTE — Patient Instructions (Addendum)
Quarantine at home until COVID test and respiratory virus panel results are back which will be about 2 days.  Rest and drink plenty of fluids.  Continue Lasix 20 mg daily.  Basic metabolic panel drawn today with results pending.  Take Zithromax Z-PAK 2 tabs day 1 followed by 1 tab days 2 through 5.  Was given Depo-Medrol 80 mg IM for respiratory congestion.  Continue Lasix 20 mg daily.  We will advise you tomorrow with results of basic metabolic panel and when to follow-up regarding tachycardia, edema and elevated creatinine.  COVID and respiratory virus panel will likely be resulted in 2 days.  May take [facility] for cough

## 2021-07-24 NOTE — Progress Notes (Signed)
   Subjective:    [name], female    DOB: 1941-07-03, 80 y.o.   MRN: [id number]  HPI 80 year old Female seen today for follow up on LE edema and elevated serum creatinine.  At last visit, she was found to have tachycardia with pulse 110.  She says this is frequent for her.  She had 1-2+ pitting edema of her ankles.  Her chest was clear.  It was felt edema was contributed to by amlodipine and heat. I did not think Maxide was working well.  I did not discontinue amlodipine but started her on Lasix 20 mg daily.  Maxide was discontinued.  She is now here for follow-up 1 week later.  Edema has improved but not completely resolved.  A basic metabolic panel was drawn today because at last visit creatinine was 1.48 and previously had been 1.09 in November 2021.  If edema persists, amlodipine may need to be discontinued and/or Lasix increased.  If tachycardia persist, she may need a beta-blocker.  In the meantime, has develop respiratory infection symptoms. Did go to an event recently over the weekend, and dined at a table with a small number of people spaced apart- not in direct contact with each other. Has nasal congestion and low grade temp. Slight cough. No myalgias, nausea ,or vomiting. No dysgeusia.  We were not aware of this today until she arrived.Has had 4 Covid vaccines including booster in April.    Review of Systems see above no shaking chills,nausea, or vomiting     Objective:   Physical Exam Blood pressure 120/60, pulse 124 regular, temperature 99.9 degrees, pulse oximetry 96% ,Weight 185 pounds, BMI 32.77 Skin is warm and dry.  TMs clear.  Neck supple.  Chest clear to auscultation.  Pharynx is very slightly injected without exudate.  Still has 1+ edema of the right lower extremity and trace of the left lower extremity      Assessment & Plan:  Persistent edema of the lower extremities but it has improved with Lasix 20 mg daily.  B-met drawn today.  We could increase  Lasix to 40 mg daily if necessary.  Tachycardia- If this persists may need metoprolol or Coreg either of which would decrease heart rate and treat BP as well.  Respiratory infection-rule out COVID-19.  She went to a recent event and could have had COVID exposure when dining.  Respiratory virus panel and COVID-19 PCR test obtained today.  She will be treated with Zithromax Z-PAK 2 tabs day 1 followed by 1 tab days 2 through 5.  Was given Depo-Medrol 80 mg IM for respiratory congestion and advised to quarantine at home until results are obtained from Respiratory virus panel and COVID PCR testing.  Call if symptoms worsen.  We will need follow-up regarding her edema in a couple of weeks depending on results of B-met.  Tessalon Perles prescribed for cough up to 3 times daily as needed.Quarantine at home until test results are back.

## 2021-07-25 ENCOUNTER — Telehealth: Payer: Self-pay | Admitting: Internal Medicine

## 2021-07-25 MED ORDER — POTASSIUM CHLORIDE CRYS ER 10 MEQ PO TBCR: 10.0000 meq | EXTENDED_RELEASE_TABLET | Freq: Every day | ORAL | 1 refills | Status: DC

## 2021-07-25 NOTE — Telephone Encounter (Signed)
Potassium is low on Lasix 20 mg daily. Starting potassium supplement 10 meq daily. Needs follow up in 2 weeks here with OV and B-met. Creatinine has improved.

## 2021-07-26 ENCOUNTER — Encounter: Payer: Self-pay | Admitting: Internal Medicine

## 2021-07-26 ENCOUNTER — Telehealth: Payer: Self-pay | Admitting: Internal Medicine

## 2021-07-26 DIAGNOSIS — R059 Cough, unspecified: Secondary | ICD-10-CM

## 2021-07-26 DIAGNOSIS — R062 Wheezing: Secondary | ICD-10-CM

## 2021-07-26 LAB — SARS-COV-2 RNA (COVID-19) RESP VIRAL PNL QL NAAT

## 2021-07-26 MED ORDER — NIRMATRELVIR/RITONAVIR (PAXLOVID) TABLET (RENAL DOSING): 2.0000 | ORAL_TABLET | Freq: Two times a day (BID) | ORAL | 0 refills | Status: DC

## 2021-07-26 NOTE — Telephone Encounter (Signed)
Called patient tonight to inform her of positive Covid PCR test. She will be placed on Paxlovid renal dose. Is to purchase pulse ox and monitor oxygen levels Must quarantine at home x 5 days. Recent Creatinine July 18 was 1.48  Est GFR November 2021 was 49 cc/minute. I am sending in Paxlovid renal dosage due to mild CKD and age.  She has picked up Mucinex today at pharmacy. Needs to stay well hydrated and walk around her apartment. Monitor pulse ox. Needs to get one from pharamacy.

## 2021-07-26 NOTE — Telephone Encounter (Signed)
Called patient regarding her complaint of wheezing to get more information. It seems the wheezing is on inspiration. She does not have pulse ox. She should invest in one.It is not all that bothersome.   Told her I thought we should get CXR tomorrow. She had some questions about where facility was located. Explained where [place] location was. She thinks her sister has appt tomorrow. The morning may not be convenient. She can go anytime. States she thinks maybe she does not need CXR. Therefore CXR has been cancelled.   May try Mucinex OTC for phelgm that she has. Asking about Covid test. It has not been resulted yet nor has respiratory virus panel been resulted yet. [facility] will call tomorrow if we do not have results by then as it is after 5 pm and staff has to go home.

## 2021-07-26 NOTE — Telephone Encounter (Signed)
After talking with Dr Renold Genta she said Lisa [name] needs a Chest X-Ray

## 2021-07-26 NOTE — Telephone Encounter (Signed)
Johny Nakao [phone]  Raena called to say she started wheezing this morning , she also said the congestion is defiantly loosening up.

## 2021-07-27 ENCOUNTER — Telehealth: Payer: Self-pay | Admitting: Internal Medicine

## 2021-07-27 MED ORDER — NIRMATRELVIR/RITONAVIR (PAXLOVID) TABLET (RENAL DOSING): 2.0000 | ORAL_TABLET | Freq: Two times a day (BID) | ORAL | 0 refills | Status: AC

## 2021-07-27 NOTE — Addendum Note (Signed)
Addended by: Mady Haagensen on: 07/27/2021 10:19 AM   Modules accepted: Orders

## 2021-07-27 NOTE — Telephone Encounter (Signed)
Faxed Positive PCR COVID Lab Results to [facility] [phone], patient 07/24/2021 phone [phone]

## 2021-07-27 NOTE — Addendum Note (Signed)
Addended by: Mady Haagensen on: 07/27/2021 09:22 AM   Modules accepted: Orders

## 2021-08-10 ENCOUNTER — Encounter: Payer: Self-pay | Admitting: Internal Medicine

## 2021-08-10 ENCOUNTER — Other Ambulatory Visit: Payer: Self-pay

## 2021-08-10 ENCOUNTER — Ambulatory Visit
Admission: RE | Admit: 2021-08-10 | Discharge: 2021-08-10 | Disposition: A | Discharge status: Home / Self Care | Payer: Medicare Other | Source: Ambulatory Visit | Attending: Internal Medicine | Admitting: Internal Medicine

## 2021-08-10 ENCOUNTER — Ambulatory Visit (INDEPENDENT_AMBULATORY_CARE_PROVIDER_SITE_OTHER): Payer: Medicare Other | Admitting: Internal Medicine

## 2021-08-10 VITALS — BP 170/80 | HR 120 | Temp 98.7°F | Ht 63.0 in | Wt 187.0 lb

## 2021-08-10 DIAGNOSIS — R0602 Shortness of breath: Secondary | ICD-10-CM | POA: Diagnosis not present

## 2021-08-10 DIAGNOSIS — Z0000 Encounter for general adult medical examination without abnormal findings: Secondary | ICD-10-CM | POA: Diagnosis not present

## 2021-08-10 DIAGNOSIS — R062 Wheezing: Secondary | ICD-10-CM | POA: Diagnosis not present

## 2021-08-10 DIAGNOSIS — E7800 Pure hypercholesterolemia, unspecified: Secondary | ICD-10-CM

## 2021-08-10 DIAGNOSIS — R600 Localized edema: Secondary | ICD-10-CM | POA: Diagnosis not present

## 2021-08-10 DIAGNOSIS — R609 Edema, unspecified: Secondary | ICD-10-CM | POA: Diagnosis not present

## 2021-08-10 DIAGNOSIS — R7989 Other specified abnormal findings of blood chemistry: Secondary | ICD-10-CM | POA: Diagnosis not present

## 2021-08-10 DIAGNOSIS — R030 Elevated blood-pressure reading, without diagnosis of hypertension: Secondary | ICD-10-CM | POA: Diagnosis not present

## 2021-08-10 DIAGNOSIS — I10 Essential (primary) hypertension: Secondary | ICD-10-CM | POA: Diagnosis not present

## 2021-08-10 DIAGNOSIS — R7302 Impaired glucose tolerance (oral): Secondary | ICD-10-CM

## 2021-08-10 DIAGNOSIS — R000 Tachycardia, unspecified: Secondary | ICD-10-CM

## 2021-08-10 DIAGNOSIS — R059 Cough, unspecified: Secondary | ICD-10-CM | POA: Diagnosis not present

## 2021-08-10 DIAGNOSIS — U071 COVID-19: Secondary | ICD-10-CM

## 2021-08-10 LAB — CBC WITH DIFFERENTIAL/PLATELET
Absolute Monocytes: 460 cells/uL (ref 200–950)
Basophils Absolute: 19 cells/uL (ref 0–200)
Basophils Relative: 0.3 %
Eosinophils Absolute: 19 cells/uL (ref 15–500)
Eosinophils Relative: 0.3 %
HCT: 38.9 % (ref 35.0–45.0)
Hemoglobin: 12.1 g/dL (ref 11.7–15.5)
Lymphs Abs: 1021 cells/uL (ref 850–3900)
MCH: 24.5 pg — ABNORMAL LOW (ref 27.0–33.0)
MCHC: 31.1 g/dL — ABNORMAL LOW (ref 32.0–36.0)
MCV: 78.7 fL — ABNORMAL LOW (ref 80.0–100.0)
MPV: 10.2 fL (ref 7.5–12.5)
Monocytes Relative: 7.3 %
Neutro Abs: 4782 cells/uL (ref 1500–7800)
Neutrophils Relative %: 75.9 %
Platelets: 296 10*3/uL (ref 140–400)
RBC: 4.94 10*6/uL (ref 3.80–5.10)
RDW: 15.5 % — ABNORMAL HIGH (ref 11.0–15.0)
Total Lymphocyte: 16.2 %
WBC: 6.3 10*3/uL (ref 3.8–10.8)

## 2021-08-10 LAB — COMPLETE METABOLIC PANEL WITH GFR
AG Ratio: 1.4 (calc) (ref 1.0–2.5)
ALT: 29 U/L (ref 6–29)
AST: 24 U/L (ref 10–35)
Albumin: 4.3 g/dL (ref 3.6–5.1)
Alkaline phosphatase (APISO): 84 U/L (ref 37–153)
BUN: 18 mg/dL (ref 7–25)
CO2: 30 mmol/L (ref 20–32)
Calcium: 9.7 mg/dL (ref 8.6–10.4)
Chloride: 105 mmol/L (ref 98–110)
Creat: 0.95 mg/dL (ref 0.60–1.00)
Globulin: 3 g/dL (calc) (ref 1.9–3.7)
Glucose, Bld: 132 mg/dL — ABNORMAL HIGH (ref 65–99)
Potassium: 3.6 mmol/L (ref 3.5–5.3)
Sodium: 142 mmol/L (ref 135–146)
Total Bilirubin: 0.8 mg/dL (ref 0.2–1.2)
Total Protein: 7.3 g/dL (ref 6.1–8.1)
eGFR: 62 mL/min/{1.73_m2} (ref >=60)

## 2021-08-10 LAB — BRAIN NATRIURETIC PEPTIDE: Brain Natriuretic Peptide: 21 pg/mL (ref <100)

## 2021-08-10 MED ORDER — FUROSEMIDE 40 MG PO TABS: 40.0000 mg | ORAL_TABLET | Freq: Every day | ORAL | 3 refills | Status: DC

## 2021-08-10 NOTE — Patient Instructions (Addendum)
Increase Lasix to 40 mg daily pending lab work. Have LLE Doppler. CXR does not show heart failure.  BNP in addition to labs drawn today. Continue Amlodipine for now. Monitor pulse and BP.  At 9pm- labs still pending except for BNP which is normal and patient was contacted.

## 2021-08-10 NOTE — Progress Notes (Signed)
Subjective:    [name], female    DOB: [birth year], 80 y.o.   MRN: [id number]  HPI 80 year old Female here for follow up on bilateral leg swelling right  leg worse than left that started when her Maxzide nongeneric was discontinued by the manufacturer around mid July.   She has taken amlodipine for a long time for HTN. Was subsequently switched to generic Lasix 20 mg daily July 18th, came down with Covid-19 on July 28, and has recovered but still has considerable swelling right ankle as well as some swelling left ankle.This edema has not changed much since July 18th. She had one dose of Depomedrol July 26th in office with respiratory infection that proved to be Covid-19.Was also treated with Zithromax Z-pak.  Creatinine July 26th  was 1.04 but had been 1.48 on July 18th when initially seen for edema.  History of seasonal allergic rhinitis.  Had left knee arthroplasty by Dr. Wynelle Link in August 2021.  She has a history of multinodular goiter followed by Dr. Dwyane Dee.  History of elevated LDL cholesterol but since starting statin medication 3 times a week, her lipids normalized.  Longstanding history of essential hypertension, glaucoma and impaired glucose tolerance.  Also has osteoarthritis of right knee.  An ultrasound-guided needle biopsy of thyroid gland in 2012 which was benign.  History of large colon polyp removed at [facility] in [place] that was benign a number of years ago.  Has been on Maxide 75/50 since 1999 for hypertension.  In 2010 amlodipine was added for improved blood pressure control.  Social history: She is a widow.  Husband passed away of complications of [id number] in nursing home in 2021.  No children.  She is a retired Pharmacist, hospital.  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  Her sister lives here in [place] in a separate residence.  Family history: Mother deceased with history of hypertension and dementia.  She had  kidney failure.  Sister who lives in [place] is apparently healthy.  Father deceased with history of diabetes and hypertension and may have had dementia.  Hysterectomy without oophorectomy for cervical cancer 1977 in [place].      Review of Systems denies chest pain or SOB. Slight cough thought to be related to post Covid inflammation     Objective:   Physical Exam BP 170/80, pulse 120 ,regular. She is anxious. EKG shows sinus tachycardia. No acute changes. Chest has some coarse breath sounds. No carotid bruits.Right ankle swollen with pitting edema. Left ankle less swelling but slight pitting edema. CXR- no CHF but has enlarged lobe thyroid that was biopsied in 2014.     Assessment & Plan:  Pitting edema lower extremities- started after Maxzide 75/50 was discontinued by manufacturer in mid July. Currently on Lasix 20 mg daily but not working and edema persists. Increase Lasix to 40 mg daily and follow up early next week. STAT labs still pending at 8:45 pm  Hx Covid-19- took Paxlovid and has recovered. CXR shows no pneumonia or CHF  HTN- BP elevated today with minimal walking  Tachycardia- EKG shows sinus tachycardia. Patient concerned about heart disease. Labs drawn and pending. Have been ordered STAT.  Edema may be aggravated by extreme hot weather recently and amlodipine which can cause fluid retention. BNP may be of help in sorting this out as well as 2D Echo.  Is to have LE Doppler right leg.  Addendum:9:30 pm --BNP is normal which would  correlate with CXR as having no heart failure.

## 2021-08-13 ENCOUNTER — Encounter: Payer: Self-pay | Admitting: Internal Medicine

## 2021-08-14 ENCOUNTER — Ambulatory Visit (INDEPENDENT_AMBULATORY_CARE_PROVIDER_SITE_OTHER): Payer: Medicare Other | Admitting: Internal Medicine

## 2021-08-14 ENCOUNTER — Encounter: Payer: Self-pay | Admitting: Internal Medicine

## 2021-08-14 ENCOUNTER — Other Ambulatory Visit: Payer: Self-pay

## 2021-08-14 VITALS — BP 160/70 | HR 100 | Temp 98.7°F | Ht 63.0 in | Wt 188.0 lb

## 2021-08-14 DIAGNOSIS — R600 Localized edema: Secondary | ICD-10-CM

## 2021-08-14 NOTE — Patient Instructions (Addendum)
Continue Lasix 40 mg daily.  Walk some for exercise and to help with edema.  Return in 2 weeks.  Basic metabolic panel drawn today.  Doppler ordered of the right lower extremity last week.  We will call and see when this is scheduled.

## 2021-08-14 NOTE — Progress Notes (Signed)
   Subjective:    [name], female    DOB: 11/07/1941, 79 y.o.   MRN: [id number]  HPI 79 year old Female seen recently with dependent edema.  Right leg has been more swollen than left leg.  Edema started when her Maxide 9 generic was discontinued by the manufacturer around mid July.  She has taken amlodipine for a long time for hypertension.  On July 18 she was switched to generic Lasix 20 mg daily.  She came down with COVID-19 on July 28 and has recovered.  Was seen in follow-up on August 12 and edema had not changed very much since July 18.  She was treated for COVID-19 with Depo-Medrol and Zithromax Z-PAK.  On July 18 creatinine was 1.48.  Creatinine on July 26 was 1.04 and Creatinine on August 12 improved to 0.95.  BNP was done with visit on August 12 was normal at 21.  C-Met was normal.  CBC was normal with the exception of MCV at 78.7 but she has longstanding history of microcytosis.  White blood cell count was normal at 6300 and hemoglobin was 12.1 g.  She had chest x-ray on August 12 showing normal heart size and pulmonary vascularity.  She does have enlargement of left lobe of thyroid longstanding and was biopsied in 2014.  Biopsy was negative.  This is causing some tracheal deviation left to right.  Has seen Dr. Kumar in the past for this.  Also on August 12, right lower extremity Doppler was ordered but this test is not yet been scheduled.    Review of Systems no new complaints.  She feels better.  She is having to urinate frequently with Lasix.     Objective:   Physical Exam Blood pressure is elevated at 160/70.  She will need to continue to monitor this.  Pulse 100.  Temperature 98.7 degrees pulse oximetry 97% weight 188 pounds.  BMI 33.30.  I think she has an element of deconditioning at this point having had COVID-19 and struggling with edema now for nearly a month.  She will need to start walking some.  Neck is supple.  Chest clear to auscultation.  Cardiac  exam regular rate and rhythm.  Continues to have lower extremity edema right greater than left but it has improved considerably as her ankles are less swollen.       Assessment & Plan:  Dependent edema-lower extremity Doppler has been ordered but not done  BNP was within normal limits indicating she does not have congestive heart failure.  Creatinine is stable and improved at 0.95.  Basic metabolic panel repeated today on Lasix 40 mg daily.  We will continue same dose.  History of COVID-19  History of hypertension with elevated blood pressure reading-needs to monitor at home  Plan: Continue current medications including Lasix 40 mg daily and follow-up at the end of August.  Follow-up on right lower extremity Doppler which was ordered last week.  Study has not been done yet.  

## 2021-08-15 ENCOUNTER — Telehealth: Payer: Self-pay

## 2021-08-15 LAB — BASIC METABOLIC PANEL
BUN: 19 mg/dL (ref 7–25)
CO2: 28 mmol/L (ref 20–32)
Calcium: 9.1 mg/dL (ref 8.6–10.4)
Chloride: 100 mmol/L (ref 98–110)
Creat: 0.93 mg/dL (ref 0.60–1.00)
Glucose, Bld: 90 mg/dL (ref 65–99)
Potassium: 4 mmol/L (ref 3.5–5.3)
Sodium: 139 mmol/L (ref 135–146)

## 2021-08-15 NOTE — Telephone Encounter (Addendum)
Patient started having diarrhea this morning she feels washed out and been to the bathroom twice. She said she has had 2 soft stools. She is eating fruit and she wants to know if there is anything that she can take. She said she had a salad, steak and mashed potatoes last night. No abdominal pain, no nausea or fever.   [phone]

## 2021-08-15 NOTE — Telephone Encounter (Signed)
Patient notified

## 2021-08-16 ENCOUNTER — Ambulatory Visit (HOSPITAL_COMMUNITY)
Admission: RE | Admit: 2021-08-16 | Discharge: 2021-08-16 | Disposition: A | Discharge status: Home / Self Care | Payer: Medicare Other | Source: Ambulatory Visit | Attending: Internal Medicine | Admitting: Internal Medicine

## 2021-08-16 ENCOUNTER — Other Ambulatory Visit: Payer: Self-pay

## 2021-08-16 DIAGNOSIS — R600 Localized edema: Secondary | ICD-10-CM | POA: Diagnosis not present

## 2021-08-16 NOTE — Progress Notes (Signed)
Lower extremity venous has been completed.   Preliminary results in CV Proc.   Abram Sander 08/16/2021 3:50 PM

## 2021-08-21 ENCOUNTER — Other Ambulatory Visit: Payer: Self-pay

## 2021-08-21 ENCOUNTER — Ambulatory Visit (INDEPENDENT_AMBULATORY_CARE_PROVIDER_SITE_OTHER): Payer: Medicare Other | Admitting: Podiatry

## 2021-08-21 DIAGNOSIS — M79674 Pain in right toe(s): Secondary | ICD-10-CM

## 2021-08-21 DIAGNOSIS — B351 Tinea unguium: Secondary | ICD-10-CM | POA: Diagnosis not present

## 2021-08-21 DIAGNOSIS — M79675 Pain in left toe(s): Secondary | ICD-10-CM | POA: Diagnosis not present

## 2021-08-21 NOTE — Addendum Note (Signed)
Addended by: Hardie Pulley on: 08/21/2021 12:34 PM   Modules accepted: Level of Service

## 2021-08-21 NOTE — Progress Notes (Signed)
  Subjective:  [name], female    DOB: 11-Dec-1941,  MRN: [id number]  Chief Complaint  Patient presents with   Nail Problem    Nail trim    80 y.o. female presents for f/u states that her nails are long and ingrowing and causing pain.  Objective:  Physical Exam: warm, good capillary refill, nail exam onychomycosis of the toenails with pain to palpation, no trophic changes or ulcerative lesions, normal DP and PT pulses, and normal sensory exam. Pitting edema right leg No images are attached to the encounter.  Assessment:   1. Pain due to onychomycosis of toenails of both feet    Plan:  Patient was evaluated and treated and all questions answered.  Onychomycosis  -Nails debrided x10  Procedure: Nail Debridement Type of Debridement: manual, sharp debridement. Instrumentation: Nail nipper, rotary burr. Number of Nails: 10  No follow-ups on file.

## 2021-08-23 DIAGNOSIS — Z96652 Presence of left artificial knee joint: Secondary | ICD-10-CM | POA: Diagnosis not present

## 2021-08-28 ENCOUNTER — Other Ambulatory Visit: Payer: Self-pay

## 2021-08-28 ENCOUNTER — Encounter: Payer: Self-pay | Admitting: Internal Medicine

## 2021-08-28 ENCOUNTER — Ambulatory Visit (INDEPENDENT_AMBULATORY_CARE_PROVIDER_SITE_OTHER): Payer: Medicare Other | Admitting: Internal Medicine

## 2021-08-28 VITALS — BP 160/60 | HR 110 | Temp 98.8°F | Ht 63.0 in | Wt 185.0 lb

## 2021-08-28 DIAGNOSIS — R609 Edema, unspecified: Secondary | ICD-10-CM

## 2021-08-28 MED ORDER — POTASSIUM CHLORIDE CRYS ER 10 MEQ PO TBCR: 10.0000 meq | EXTENDED_RELEASE_TABLET | Freq: Every day | ORAL | 0 refills | Status: DC

## 2021-08-28 MED ORDER — FUROSEMIDE 20 MG PO TABS: 20.0000 mg | ORAL_TABLET | Freq: Every day | ORAL | 0 refills | Status: DC

## 2021-08-28 NOTE — Progress Notes (Signed)
   Subjective:    [name], female    DOB: October 20, 1941, 80 y.o.   MRN: [id number]  HPI 80 year old Female seen for follow up on dependent edema. Had negative LE Doppler. Has appt with Dr. Harrell Gave, Cardiologist in late October regarding edema and evaluating for CHF. She is planning to travel by train with her sister to their hometown in [place] by train in the near future. They will be able to relax in bedroom accommodations and can put her feet up.  She has cut back on her Lasix.  She was started on Crestor 5 mg 3 times a week with supper in April.  For hypertension she is on amlodipine 5 mg daily and Lasix.  We worked diligently to try to eliminate her dependent edema.  Right leg was more swollen than left leg.  Edema started when her Maxide 25 generic was discontinued by the manufacturer around mid July.  She has taken amlodipine for a long time for hypertension.  On July 18 she was switched to generic Lasix for Maxide.  She came down with COVID-19 July 28 and has recovered.  She was seen in follow-up on August 12 and edema had not changed very much since July 18.  She was treated for COVID-19 with Depo-Medrol and Zithromax Z-PAK.  On July 18 creatinine was 1.48.  On July 26 creatinine was 1.04.  Creatinine on August 12 improved to 0.95.  BNP on August 12 was normal at 21.  She had a chest x-ray August 12 showing normal heart size and pulmonary vascularity.  She feels her edema is better. Today BUN is 19 and Creatinine is 0.90. K is 4.1.    Review of Systems see above- no chest pain or SOB. Feels she has recovered from Covid-19.     Objective:   Physical Exam BP 160/60 pulse 110. It is very hot outside and I think patient is somewhat deconditioned She is afebrile Pulse ox is 97% on room air weight is 185 pounds. Has lost 2 pounds since mid-August She does have pitting edema both lower extremities today but its not excessive.  No carotid bruits.  Chest clear to  auscultation.  Cardiac exam regular rate and rhythm.     Assessment & Plan:  I am concerned that she still is exhibiting significant lower extremity edema.  I do not know how much she is walking.  I think that would help her edema.  BNP is normal.  Electrolytes are normal.  She has some deconditioning.  She is tachycardic today.  I think she could benefit from an exercise program.  Hyperlipidemia treated with Crestor 5 mg 3 times a week.  Essential hypertension treated with amlodipine and Lasix.  She is taking a potassium supplement 10 mEq daily.  Potassium is normal.  Plan: She is encouraged to see cardiologist in August.  She has appointment for Medicare wellness visit here in November 2022.  History of COVID-19-seems to have recovered

## 2021-08-29 ENCOUNTER — Encounter: Payer: Self-pay | Admitting: Internal Medicine

## 2021-08-29 LAB — BASIC METABOLIC PANEL
BUN: 19 mg/dL (ref 7–25)
CO2: 30 mmol/L (ref 20–32)
Calcium: 9.5 mg/dL (ref 8.6–10.4)
Chloride: 104 mmol/L (ref 98–110)
Creat: 0.9 mg/dL (ref 0.60–1.00)
Glucose, Bld: 123 mg/dL — ABNORMAL HIGH (ref 65–99)
Potassium: 4.1 mmol/L (ref 3.5–5.3)
Sodium: 143 mmol/L (ref 135–146)

## 2021-08-29 NOTE — Patient Instructions (Signed)
Okay to continue current medications.  Watch out for worsening of edema.  Keep legs elevated and watch salt intake.  Try to walk some.  Continue Crestor 5 mg 3 times a week.  Continue amlodipine and current dose of Lasix.  Continue potassium supplement.  Seeing cardiologist in August and have Medicare wellness visit here in November.

## 2021-09-04 ENCOUNTER — Telehealth: Payer: Self-pay | Admitting: Internal Medicine

## 2021-09-04 NOTE — Telephone Encounter (Signed)
Left message for patient and advised her labs are normal including potassium and she should continue taking potassium tablets.

## 2021-09-04 NOTE — Telephone Encounter (Signed)
Pt called and said she hadnt heard anything about her lab work from 08/28/21 and that she also needed to know if she still needed to continue taking her potassium tablets or not. Please advise

## 2021-09-23 DIAGNOSIS — I10 Essential (primary) hypertension: Secondary | ICD-10-CM | POA: Diagnosis not present

## 2021-09-28 ENCOUNTER — Ambulatory Visit: Payer: Medicare Other | Admitting: Podiatry

## 2021-10-02 ENCOUNTER — Telehealth: Payer: Self-pay | Admitting: Internal Medicine

## 2021-10-02 ENCOUNTER — Other Ambulatory Visit (HOSPITAL_BASED_OUTPATIENT_CLINIC_OR_DEPARTMENT_OTHER): Payer: Self-pay

## 2021-10-02 NOTE — Telephone Encounter (Signed)
Lisa [name] [phone]  [name] called to ask if the flu shot was recommended for anyone that has allergies, she has rag weed right now.

## 2021-10-02 NOTE — Telephone Encounter (Signed)
After speaking with Dr Renold Genta, she said Flu shot does not have anything to do with allergies, so I called patient back to let her know this. She verbalized understanding.

## 2021-10-02 NOTE — Telephone Encounter (Signed)
After speaking with Dr Renold Genta, called patient to schedule an appointment. LM for her to [facility]

## 2021-10-02 NOTE — Telephone Encounter (Signed)
Lisa [name] [phone]  Cipriana called to say she has nose congestion, cough sneezing, not all the time, she thinks it is allergies rag weed, she also has found mold in the home. She done COVID test yesterday it was negative. No fever. Had Roff in July, Has had 3 vaccines. Thinks she might need referral to allergist.

## 2021-10-02 NOTE — Telephone Encounter (Signed)
scheduled

## 2021-10-05 ENCOUNTER — Other Ambulatory Visit: Payer: Self-pay

## 2021-10-05 ENCOUNTER — Ambulatory Visit (INDEPENDENT_AMBULATORY_CARE_PROVIDER_SITE_OTHER): Payer: Medicare Other | Admitting: Internal Medicine

## 2021-10-05 VITALS — BP 138/78 | Temp 97.9°F | Resp 16 | Ht 63.0 in | Wt 182.0 lb

## 2021-10-05 DIAGNOSIS — R059 Cough, unspecified: Secondary | ICD-10-CM | POA: Diagnosis not present

## 2021-10-05 DIAGNOSIS — R600 Localized edema: Secondary | ICD-10-CM

## 2021-10-05 DIAGNOSIS — J069 Acute upper respiratory infection, unspecified: Secondary | ICD-10-CM | POA: Diagnosis not present

## 2021-10-05 DIAGNOSIS — J301 Allergic rhinitis due to pollen: Secondary | ICD-10-CM

## 2021-10-05 MED ORDER — METHYLPREDNISOLONE ACETATE 80 MG/ML IJ SUSP
80.0000 mg | Freq: Once | INTRAMUSCULAR | Status: AC
  Administered 2021-10-05: 80 mg via INTRAMUSCULAR

## 2021-10-05 MED ORDER — BENZONATATE 100 MG PO CAPS: 100.0000 mg | ORAL_CAPSULE | Freq: Three times a day (TID) | ORAL | 0 refills | Status: DC | PRN

## 2021-10-05 MED ORDER — AZITHROMYCIN 250 MG PO TABS: ORAL_TABLET | ORAL | 0 refills | Status: AC

## 2021-10-05 NOTE — Progress Notes (Signed)
   Subjective:    Patient ID: LENZIE MONTESANO, female    DOB: 13-Nov-1941, 80 y.o.   MRN: [id number]  HPI 80 year old Female seen for respiratory infection. Turned heat on recently. Had heating unit checked by [place]. Having fireplace checked. Onset of intermittent cough onset Monday. No sputum production.  Sister who stays with her is not ill.  Had Covid-19 in late July.  Has had issues recently with LE edema  Will have Covid vaccine end of October.   Seeing Cardiology for LE edema  Review of Systems no fever or chills. Nose is stopped up in the morning.     Objective:   Physical Exam BP 138/78 RR 16 T 97.9 degrees weight 182 pounds.  Skin warm and dry. TMs clear. Neck supple with no adenopathy. Chest is clear. 1+ LE Cor:RRR     Assessment & Plan:    Acute respiratory infection vs. allergic rhinitis   Plan: Depo-Medrol 80 mg IM.  Zithromax Z-PAK 2 tabs day 1 followed by 1 tab days 2 through 5.  Tessalon Perles 100 mg to take up to 3 times daily as needed for cough.  To be seen by Cardiology for dependent edema with Cardiology. May need allergy evaluation if symptoms persist.

## 2021-10-05 NOTE — Patient Instructions (Addendum)
Depo-Medrol 80 mg IM.  Take Zithromax Z-PAK 2 tabs day 1 followed by 1 tab days 2- 5.  Tessalon Perles 100 mg up to 3 times daily as needed for cough.

## 2021-10-16 ENCOUNTER — Other Ambulatory Visit: Payer: Self-pay

## 2021-10-16 ENCOUNTER — Ambulatory Visit (INDEPENDENT_AMBULATORY_CARE_PROVIDER_SITE_OTHER): Payer: Medicare Other | Admitting: Podiatry

## 2021-10-16 DIAGNOSIS — B351 Tinea unguium: Secondary | ICD-10-CM

## 2021-10-16 DIAGNOSIS — M79674 Pain in right toe(s): Secondary | ICD-10-CM | POA: Diagnosis not present

## 2021-10-16 DIAGNOSIS — M79675 Pain in left toe(s): Secondary | ICD-10-CM | POA: Diagnosis not present

## 2021-10-16 NOTE — Progress Notes (Signed)
  Subjective:  [name], female    DOB: Jan 31, 1941,  MRN: [id number]  Chief Complaint  Patient presents with   Nail Problem    RFC Bilateral nails 1-5   Callouses    Callus of right foot.   80 y.o. female presents for f/u states that her nails have grown and are causing pain.  Objective:  Physical Exam: warm, good capillary refill, nail exam onychomycosis of the toenails with pain to palpation, no trophic changes or ulcerative lesions, normal DP and PT pulses, and normal sensory exam. Pitting edema right leg No images are attached to the encounter.  Assessment:   1. Pain due to onychomycosis of toenails of both feet    Plan:  Patient was evaluated and treated and all questions answered.  Onychomycosis  -Palliative debridement of nails -f/u PRN  Procedure: Nail Debridement Type of Debridement: manual, sharp debridement. Instrumentation: Nail nipper, rotary burr. Number of Nails: 10   No follow-ups on file.

## 2021-10-23 ENCOUNTER — Other Ambulatory Visit: Payer: Self-pay

## 2021-10-23 ENCOUNTER — Ambulatory Visit (INDEPENDENT_AMBULATORY_CARE_PROVIDER_SITE_OTHER): Payer: Medicare Other | Admitting: Cardiology

## 2021-10-23 ENCOUNTER — Encounter (HOSPITAL_BASED_OUTPATIENT_CLINIC_OR_DEPARTMENT_OTHER): Payer: Self-pay | Admitting: Cardiology

## 2021-10-23 VITALS — BP 138/82 | HR 99 | Ht 63.0 in | Wt 185.6 lb

## 2021-10-23 DIAGNOSIS — E7800 Pure hypercholesterolemia, unspecified: Secondary | ICD-10-CM | POA: Diagnosis not present

## 2021-10-23 DIAGNOSIS — R000 Tachycardia, unspecified: Secondary | ICD-10-CM

## 2021-10-23 DIAGNOSIS — R600 Localized edema: Secondary | ICD-10-CM | POA: Diagnosis not present

## 2021-10-23 DIAGNOSIS — I10 Essential (primary) hypertension: Secondary | ICD-10-CM | POA: Diagnosis not present

## 2021-10-23 DIAGNOSIS — Z7189 Other specified counseling: Secondary | ICD-10-CM

## 2021-10-23 NOTE — Patient Instructions (Signed)
Medication Instructions:  No changes today *If you need a refill on your cardiac medications before your next appointment, please call your pharmacy*   Lab Work: None today    Testing/Procedures: Your physician has requested that you have an echocardiogram. Echocardiography is a painless test that uses sound waves to create images of your heart. It provides your doctor with information about the size and shape of your heart and how well your heart's chambers and valves are working. This procedure takes approximately one hour. There are no restrictions for this procedure.  [facility] [place]   Follow-Up: At [facility], you and your health needs are our priority.  As part of our continuing mission to provide you with exceptional heart care, we have created designated Provider Care Teams.  These Care Teams include your primary Cardiologist (physician) and Advanced Practice Providers (APPs -  Physician Assistants and Nurse Practitioners) who all work together to provide you with the care you need, when you need it.  We recommend signing up for the patient portal called "MyChart".  Sign up information is provided on this After Visit Summary.  MyChart is used to connect with patients for Virtual Visits (Telemedicine).  Patients are able to view lab/test results, encounter notes, upcoming appointments, etc.  Non-urgent messages can be sent to your provider as well.   To learn more about what you can do with MyChart, go to [email or web address].    Your next appointment:   3 month(s)  The format for your next appointment:   In Person  Provider:   Buford Dresser, MD   Other Instructions -how to check blood pressure:  -sit comfortably in a chair, feet uncrossed and flat on floor, for 5-10 minutes  -arm ideally should rest at the level of the heart. However, arm should be relaxed and not tense (for example, do not hold the arm  up unsupported)  -avoid exercise, caffeine, and tobacco for at least 30 minutes prior to BP reading  -don't take BP cuff reading over clothes (always place on skin directly)  -I prefer to know how well the medication is working, so I would like you to take your readings 1-2 hours after taking your blood pressure medication if possible

## 2021-10-23 NOTE — Progress Notes (Signed)
Cardiology Office Note:    Date:  10/24/2021   ID:  Lisa [name], Lisa [name] 12/30/1941, MRN [id number]  PCP:  Elby Showers, MD  Cardiologist:  Buford Dresser, MD  Referring MD: Elby Showers, MD   No chief complaint on file.   History of Present Illness:    Lisa [name] is a 80 y.o. female with a hx of hyperlipidemia, hypertension, obesity, arthritis, cervical cancer, and goiter, who is seen as a new consult at the request of Baxley, Cresenciano Lick, MD for the evaluation and management of tachycardia.  Prior notes from Dr. Renold Genta reviewed. Referral notes tachycardia, but per Dr. Verlene Mayer note from 08/28/21 she has been having lower extremity edema.  Cardiovascular risk factors: Prior clinical ASCVD: None Comorbid conditions: hypertension, hyperlipidemia Metabolic syndrome/Obesity:  Obesity, Chronic inflammatory conditions: none Tobacco use history: Not a smoker (2-3 months in college) Alcohol consumption: Every now and then Family history: mother and father had hypertension, father had heart disease  -Prior ECG: 08/10/2021 - 100-110 bpm, Knee arthroplasty 2021 - 96 bpm  Overall, she was feeling well until 05/2021. Her medication Maxide was no longer in production, and she was forced to switch to a generic form. She then began to have more difficulty with managing LE edema.   Recently her Lasix was decreased to 20 mg. She regularly wears compression socks which does help her. At night she elevates her legs, and notes some improvement by morning. Has been watching her salt intake as well.  Previously only had chest discomfort 5-6 years ago due to a GI issue. Also denies any issues concerning palpitations.  At home, her blood pressure is usually stable, averaging 110-120s/70s.  She denies any chest pain, or shortness of breath. No lightheadedness, headaches, syncope, orthopnea, or PND. Endorses seasonal allergies treated with Flonase every morning.  Past Medical History:   Diagnosis Date   Allergy    Arthritis    Cancer ([place])    cervical   Complication of anesthesia    Difficulty waking up after hysterectomy in 1973   Goiter    Headache    Sinus headaches   Hyperlipidemia    Hypertension    Obesity    Osteopenia    Vitamin D deficiency     Past Surgical History:  Procedure Laterality Date   ABDOMINAL HYSTERECTOMY     EYE SURGERY     HAMMER TOE SURGERY  12/02   right   TOTAL KNEE ARTHROPLASTY Left 08/21/2020   Procedure: TOTAL KNEE ARTHROPLASTY;  Surgeon: Gaynelle Arabian, MD;  Location: [facility];  Service: Orthopedics;  Laterality: Left;  48min   WISDOM TOOTH EXTRACTION      Current Medications: Current Outpatient Medications on File Prior to Visit  Medication Sig   acetaminophen (TYLENOL) 500 MG tablet Take 500-1,000 mg by mouth every 6 (six) hours as needed for mild pain or moderate pain.   amLODipine (NORVASC) 5 MG tablet TAKE 1 TABLET(5 MG) BY MOUTH DAILY   benzonatate (TESSALON PERLES) 100 MG capsule Take 1 capsule (100 mg total) by mouth 3 (three) times daily as needed for cough.   fluticasone (FLONASE) 50 MCG/ACT nasal spray Place 2 sprays into both nostrils daily as needed for allergies or rhinitis.   furosemide (LASIX) 20 MG tablet Take 1 tablet (20 mg total) by mouth daily.   loratadine (CLARITIN) 10 MG tablet Take 10 mg by mouth daily.   potassium chloride (KLOR-CON) 10 MEQ tablet Take 1 tablet (10 mEq total) by mouth  daily.   rosuvastatin (CRESTOR) 5 MG tablet TAKE 1 TABLET THREE TIMES A WEEK WITH SUPPER.   No current facility-administered medications on file prior to visit.     Allergies:   Penicillins, Codeine, Darvon, and Tall ragweed   Social History   Tobacco Use   Smoking status: Never   Smokeless tobacco: Never  Vaping Use   Vaping Use: Never used  Substance Use Topics   Alcohol use: No    Comment: Wine once or twice a year   Drug use: No    Family History: family history includes Heart disease in her father;  Hypertension in her father and mother; Thyroid disease in her father.  ROS:   Please see the history of present illness.  Additional pertinent ROS: Constitutional: Negative for chills, fever, night sweats, unintentional weight loss  HENT: Negative for ear pain and hearing loss.   Eyes: Negative for loss of vision and eye pain.  Respiratory: Negative for cough, sputum, wheezing.   Cardiovascular: See HPI. Gastrointestinal: Negative for abdominal pain, melena, and hematochezia.  Genitourinary: Negative for dysuria and hematuria.  Musculoskeletal: Negative for falls and myalgias.  Skin: Negative for itching and rash.  Neurological: Negative for focal weakness, focal sensory changes and loss of consciousness.  Endo/Heme/Allergies: Positive for seasonal allergies. Does not bruise/bleed easily.     EKGs/Labs/Other Studies Reviewed:    The following studies were reviewed today:  LE Venous DVT 08/16/2021: Summary:  RIGHT:  - There is no evidence of deep vein thrombosis in the lower extremity.     - No cystic structure found in the popliteal fossa.     LEFT:  - No evidence of common femoral vein obstruction.   EKG:  EKG is personally reviewed.   10/23/2021: NSR at 99 bpm, nonspecific ST pattern  Recent Labs: 11/16/2020: TSH 1.33 08/10/2021: ALT 29; Brain Natriuretic Peptide 21; Hemoglobin 12.1; Platelets 296 08/28/2021: BUN 19; Creat 0.90; Potassium 4.1; Sodium 143   Recent Lipid Panel    Component Value Date/Time   CHOL 168 11/16/2020 0958   TRIG 63 11/16/2020 0958   HDL 70 11/16/2020 0958   CHOLHDL 2.4 11/16/2020 0958   VLDL 11 01/14/2017 1136   LDLCALC 84 11/16/2020 0958    Physical Exam:    VS:  BP 138/82 (BP Location: Right Arm, Patient Position: Sitting, Cuff Size: Normal)   Pulse 99   Ht 5\' 3"  (1.6 m)   Wt 185 lb 9.6 oz (84.2 kg)   SpO2 97%   BMI 32.88 kg/m     Wt Readings from Last 3 Encounters:  10/23/21 185 lb 9.6 oz (84.2 kg)  10/05/21 182 lb (82.6 kg)   08/28/21 185 lb (83.9 kg)    GEN: Well nourished, well developed in no acute distress HEENT: Normal, moist mucous membranes NECK: No JVD CARDIAC: regular rhythm, normal S1 and S2, no rubs or gallops. No murmur. VASCULAR: Radial and DP pulses 2+ bilaterally. No carotid bruits RESPIRATORY:  Clear to auscultation without rales, wheezing or rhonchi  ABDOMEN: Soft, non-tender, non-distended MUSCULOSKELETAL:  Ambulates independently SKIN: Warm and dry, 2-3+ bilateral LE pitting edema to above knees NEUROLOGIC:  Alert and oriented x 3. No focal neuro deficits noted. PSYCHIATRIC:  Normal affect    ASSESSMENT:    1. Bilateral leg edema   2. Essential hypertension   3. Pure hypercholesterolemia   4. Sinus tachycardia   5. Cardiac risk counseling   6. Counseling on health promotion and disease prevention    PLAN:  Bilateral LE edema: -profound on exam today. Has some components of chronic venous insufficiency, but given pitting will get echo to rule out structural etiology  Hypertension -on amlodipine 5 mg, furosemide 20 mg  -continue to monitor  Hypercholesterolemia -continue rosuvastatin  Sinus tachycardia -borderline, does not require treatment  Cardiac risk counseling and prevention recommendations: -recommend heart healthy/Mediterranean diet, with whole grains, fruits, vegetable, fish, lean meats, nuts, and olive oil. Limit salt. -recommend moderate walking, 3-5 times/week for 30-50 minutes each session. Aim for at least 150 minutes.week. Goal should be pace of 3 miles/hours, or walking 1.5 miles in 30 minutes -recommend avoidance of tobacco products. Avoid excess alcohol. -ASCVD risk score: The 10-year ASCVD risk score (Arnett DK, et al., 2019) is: 37.4%   Values used to calculate the score:     Age: 35 years     Sex: Female     Is Non-Hispanic African American: Yes     Diabetic: Yes     Tobacco smoker: No     Systolic Blood Pressure: 093 mmHg     Is BP treated:  Yes     HDL Cholesterol: 70 mg/dL     Total Cholesterol: 168 mg/dL    Plan for follow up: 3 months or sooner as needed.  Buford Dresser, MD, PhD, [facility]    Medication Adjustments/Labs and Tests Ordered: Current medicines are reviewed at length with the patient today.  Concerns regarding medicines are outlined above.   Orders Placed This Encounter  Procedures   ECHOCARDIOGRAM COMPLETE   Electrocardiogram report     No orders of the defined types were placed in this encounter.   Patient Instructions  Medication Instructions:  No changes today *If you need a refill on your cardiac medications before your next appointment, please call your pharmacy*   Lab Work: None today    Testing/Procedures: Your physician has requested that you have an echocardiogram. Echocardiography is a painless test that uses sound waves to create images of your heart. It provides your doctor with information about the size and shape of your heart and how well your heart's chambers and valves are working. This procedure takes approximately one hour. There are no restrictions for this procedure.  [facility] [place]   Follow-Up: At [facility], you and your health needs are our priority.  As part of our continuing mission to provide you with exceptional heart care, we have created designated Provider Care Teams.  These Care Teams include your primary Cardiologist (physician) and Advanced Practice Providers (APPs -  Physician Assistants and Nurse Practitioners) who all work together to provide you with the care you need, when you need it.  We recommend signing up for the patient portal called "MyChart".  Sign up information is provided on this After Visit Summary.  MyChart is used to connect with patients for Virtual Visits (Telemedicine).  Patients are able to view lab/test results, encounter notes, upcoming  appointments, etc.  Non-urgent messages can be sent to your provider as well.   To learn more about what you can do with MyChart, go to [email or web address].    Your next appointment:   3 month(s)  The format for your next appointment:   In Person  Provider:   Buford Dresser, MD   Other Instructions -how to check blood pressure:  -sit comfortably in a chair, feet uncrossed and flat on floor, for 5-10 minutes  -arm ideally should rest at the  level of the heart. However, arm should be relaxed and not tense (for example, do not hold the arm up unsupported)  -avoid exercise, caffeine, and tobacco for at least 30 minutes prior to BP reading  -don't take BP cuff reading over clothes (always place on skin directly)  -I prefer to know how well the medication is working, so I would like you to take your readings 1-2 hours after taking your blood pressure medication if possible     I,Mathew Stumpf,acting as a scribe for [employer], MD.,have documented all relevant documentation on the behalf of Buford Dresser, MD,as directed by  Buford Dresser, MD while in the presence of Buford Dresser, MD.   I, Buford Dresser, MD, have reviewed all documentation for this visit. The documentation on 10/24/21 for the exam, diagnosis, procedures, and orders are all accurate and complete.   Signed, Buford Dresser, MD PhD 10/24/2021 4:05 PM    [facility]

## 2021-10-24 NOTE — Addendum Note (Signed)
Addended by: Gerald Stabs on: 10/24/2021 04:41 PM   Modules accepted: Orders

## 2021-10-28 ENCOUNTER — Encounter: Payer: Self-pay | Admitting: Internal Medicine

## 2021-10-31 ENCOUNTER — Other Ambulatory Visit: Payer: Self-pay

## 2021-10-31 ENCOUNTER — Ambulatory Visit (INDEPENDENT_AMBULATORY_CARE_PROVIDER_SITE_OTHER): Payer: Medicare Other

## 2021-10-31 ENCOUNTER — Other Ambulatory Visit (HOSPITAL_BASED_OUTPATIENT_CLINIC_OR_DEPARTMENT_OTHER): Payer: Self-pay

## 2021-10-31 DIAGNOSIS — I10 Essential (primary) hypertension: Secondary | ICD-10-CM

## 2021-10-31 DIAGNOSIS — R600 Localized edema: Secondary | ICD-10-CM | POA: Diagnosis not present

## 2021-10-31 LAB — ECHOCARDIOGRAM COMPLETE
AR max vel: 1.97 cm2
AV Area VTI: 2.15 cm2
AV Area mean vel: 1.99 cm2
AV Mean grad: 9.7 mmHg
AV Peak grad: 18.4 mmHg
Ao pk vel: 2.15 m/s
Area-P 1/2: 4.89 cm2
P 1/2 time: 394 msec
S' Lateral: 2.01 cm

## 2021-10-31 MED ORDER — FLUAD QUADRIVALENT 0.5 ML IM PRSY
PREFILLED_SYRINGE | INTRAMUSCULAR | 0 refills | Status: DC
  Filled 2021-10-31: qty 0.5, 1d supply, fill #0

## 2021-11-13 ENCOUNTER — Ambulatory Visit: Payer: Medicare Other | Attending: Internal Medicine

## 2021-11-13 ENCOUNTER — Other Ambulatory Visit (HOSPITAL_BASED_OUTPATIENT_CLINIC_OR_DEPARTMENT_OTHER): Payer: Self-pay

## 2021-11-13 DIAGNOSIS — Z23 Encounter for immunization: Secondary | ICD-10-CM

## 2021-11-13 MED ORDER — PFIZER COVID-19 VAC BIVALENT 30 MCG/0.3ML IM SUSP
INTRAMUSCULAR | 0 refills | Status: DC
  Filled 2021-11-13: qty 0.3, 1d supply, fill #0

## 2021-11-13 NOTE — Progress Notes (Signed)
   Covid-19 Vaccination Clinic  Name:  Lisa [name]    MRN: [id number] DOB: 1941/12/07  11/13/2021  [name] was observed post Covid-19 immunization for 15 minutes without incident. She was provided with Vaccine Information Sheet and instruction to access the V-Safe system.   [name] was instructed to call 911 with any severe reactions post vaccine: Difficulty breathing  Swelling of face and throat  A fast heartbeat  A bad rash all over body  Dizziness and weakness   Immunizations Administered     Name Date Dose VIS Date Route   Pfizer Covid-19 Vaccine Bivalent Booster 11/13/2021  3:10 PM 0.3 mL 08/29/2021 Intramuscular   Manufacturer: [place]   Lot: [id number]   [place]: [phone]

## 2021-11-20 ENCOUNTER — Other Ambulatory Visit: Payer: Self-pay

## 2021-11-20 ENCOUNTER — Other Ambulatory Visit: Payer: Medicare Other | Admitting: Internal Medicine

## 2021-11-20 DIAGNOSIS — R030 Elevated blood-pressure reading, without diagnosis of hypertension: Secondary | ICD-10-CM | POA: Diagnosis not present

## 2021-11-20 DIAGNOSIS — E7800 Pure hypercholesterolemia, unspecified: Secondary | ICD-10-CM

## 2021-11-20 DIAGNOSIS — R7302 Impaired glucose tolerance (oral): Secondary | ICD-10-CM | POA: Diagnosis not present

## 2021-11-20 DIAGNOSIS — E041 Nontoxic single thyroid nodule: Secondary | ICD-10-CM | POA: Diagnosis not present

## 2021-11-20 DIAGNOSIS — R6889 Other general symptoms and signs: Secondary | ICD-10-CM | POA: Diagnosis not present

## 2021-11-21 LAB — COMPLETE METABOLIC PANEL WITH GFR
AG Ratio: 1.5 (calc) (ref 1.0–2.5)
ALT: 10 U/L (ref 6–29)
AST: 17 U/L (ref 10–35)
Albumin: 4.4 g/dL (ref 3.6–5.1)
Alkaline phosphatase (APISO): 100 U/L (ref 37–153)
BUN: 20 mg/dL (ref 7–25)
CO2: 28 mmol/L (ref 20–32)
Calcium: 9.5 mg/dL (ref 8.6–10.4)
Chloride: 105 mmol/L (ref 98–110)
Creat: 0.8 mg/dL (ref 0.60–1.00)
Globulin: 2.9 g/dL (calc) (ref 1.9–3.7)
Glucose, Bld: 103 mg/dL — ABNORMAL HIGH (ref 65–99)
Potassium: 4.1 mmol/L (ref 3.5–5.3)
Sodium: 143 mmol/L (ref 135–146)
Total Bilirubin: 0.6 mg/dL (ref 0.2–1.2)
Total Protein: 7.3 g/dL (ref 6.1–8.1)
eGFR: 76 mL/min/{1.73_m2} (ref >=60)

## 2021-11-21 LAB — HEMOGLOBIN A1C
Hgb A1c MFr Bld: 5.6 % of total Hgb (ref <5.7)
Mean Plasma Glucose: 114 mg/dL
eAG (mmol/L): 6.3 mmol/L

## 2021-11-21 LAB — LIPID PANEL
Cholesterol: 210 mg/dL — ABNORMAL HIGH (ref <200)
HDL: 71 mg/dL (ref >=50)
LDL Cholesterol (Calc): 124 mg/dL (calc) — ABNORMAL HIGH
Non-HDL Cholesterol (Calc): 139 mg/dL (calc) — ABNORMAL HIGH (ref <130)
Total CHOL/HDL Ratio: 3 (calc) (ref <5.0)
Triglycerides: 49 mg/dL (ref <150)

## 2021-11-21 LAB — CBC WITH DIFFERENTIAL/PLATELET
Absolute Monocytes: 307 cells/uL (ref 200–950)
Basophils Absolute: 19 cells/uL (ref 0–200)
Basophils Relative: 0.4 %
Eosinophils Absolute: 48 cells/uL (ref 15–500)
Eosinophils Relative: 1 %
HCT: 40.9 % (ref 35.0–45.0)
Hemoglobin: 12.8 g/dL (ref 11.7–15.5)
Lymphs Abs: 960 cells/uL (ref 850–3900)
MCH: 25 pg — ABNORMAL LOW (ref 27.0–33.0)
MCHC: 31.3 g/dL — ABNORMAL LOW (ref 32.0–36.0)
MCV: 79.7 fL — ABNORMAL LOW (ref 80.0–100.0)
MPV: 10.1 fL (ref 7.5–12.5)
Monocytes Relative: 6.4 %
Neutro Abs: 3466 cells/uL (ref 1500–7800)
Neutrophils Relative %: 72.2 %
Platelets: 230 10*3/uL (ref 140–400)
RBC: 5.13 10*6/uL — ABNORMAL HIGH (ref 3.80–5.10)
RDW: 14.7 % (ref 11.0–15.0)
Total Lymphocyte: 20 %
WBC: 4.8 10*3/uL (ref 3.8–10.8)

## 2021-11-21 LAB — TSH: TSH: 1.14 mIU/L (ref 0.40–4.50)

## 2021-11-26 ENCOUNTER — Other Ambulatory Visit: Payer: Self-pay

## 2021-11-26 ENCOUNTER — Encounter: Payer: Self-pay | Admitting: Internal Medicine

## 2021-11-26 ENCOUNTER — Telehealth: Payer: Self-pay | Admitting: Internal Medicine

## 2021-11-26 ENCOUNTER — Ambulatory Visit (INDEPENDENT_AMBULATORY_CARE_PROVIDER_SITE_OTHER): Payer: Medicare Other | Admitting: Internal Medicine

## 2021-11-26 VITALS — BP 160/82 | HR 96 | Temp 98.5°F | Ht 63.0 in | Wt 180.0 lb

## 2021-11-26 DIAGNOSIS — E042 Nontoxic multinodular goiter: Secondary | ICD-10-CM

## 2021-11-26 DIAGNOSIS — I10 Essential (primary) hypertension: Secondary | ICD-10-CM | POA: Diagnosis not present

## 2021-11-26 DIAGNOSIS — E7800 Pure hypercholesterolemia, unspecified: Secondary | ICD-10-CM | POA: Diagnosis not present

## 2021-11-26 DIAGNOSIS — E8881 Metabolic syndrome: Secondary | ICD-10-CM

## 2021-11-26 DIAGNOSIS — E041 Nontoxic single thyroid nodule: Secondary | ICD-10-CM | POA: Diagnosis not present

## 2021-11-26 DIAGNOSIS — R600 Localized edema: Secondary | ICD-10-CM | POA: Diagnosis not present

## 2021-11-26 DIAGNOSIS — Z6831 Body mass index (BMI) 31.0-31.9, adult: Secondary | ICD-10-CM

## 2021-11-26 DIAGNOSIS — R030 Elevated blood-pressure reading, without diagnosis of hypertension: Secondary | ICD-10-CM | POA: Diagnosis not present

## 2021-11-26 DIAGNOSIS — R609 Edema, unspecified: Secondary | ICD-10-CM

## 2021-11-26 DIAGNOSIS — R7302 Impaired glucose tolerance (oral): Secondary | ICD-10-CM

## 2021-11-26 DIAGNOSIS — Z8541 Personal history of malignant neoplasm of cervix uteri: Secondary | ICD-10-CM

## 2021-11-26 DIAGNOSIS — F411 Generalized anxiety disorder: Secondary | ICD-10-CM | POA: Diagnosis not present

## 2021-11-26 DIAGNOSIS — J3089 Other allergic rhinitis: Secondary | ICD-10-CM

## 2021-11-26 DIAGNOSIS — Z0000 Encounter for general adult medical examination without abnormal findings: Secondary | ICD-10-CM | POA: Diagnosis not present

## 2021-11-26 DIAGNOSIS — Z96652 Presence of left artificial knee joint: Secondary | ICD-10-CM

## 2021-11-26 LAB — POCT URINALYSIS DIPSTICK
Bilirubin, UA: NEGATIVE
Blood, UA: NEGATIVE
Glucose, UA: NEGATIVE
Ketones, UA: NEGATIVE
Leukocytes, UA: NEGATIVE
Nitrite, UA: NEGATIVE
Protein, UA: NEGATIVE
Spec Grav, UA: 1.02 (ref 1.010–1.025)
Urobilinogen, UA: 0.2 E.U./dL
pH, UA: 6 (ref 5.0–8.0)

## 2021-11-26 NOTE — Telephone Encounter (Signed)
Lisa [name] [phone]  [name] called to check on her refills below. She was at pharmacy, I let her know, we have not had time to do them yet.  furosemide (LASIX) 20 MG tablet potassium chloride (KLOR-CON) 10 MEQ tablet   [name], [place] Phone:  [phone]  Fax:  [phone]

## 2021-11-26 NOTE — Progress Notes (Signed)
Annual Wellness Visit     Patient: Lisa [name], Female    DOB: April 20, 1941, 80 y.o.   MRN: [id number] Visit Date: 11/26/2021  Chief Complaint  Patient presents with   Medicare Wellness   Subjective    Lisa [name] is a 80 y.o. female who presents today for her Annual Wellness Visit.  HPI Also presents for annual health maintenance exam and evaluation of multiple medical issues.      Patient Care Team: Elby Showers, MD as PCP - General (Internal Medicine) Buford Dresser, MD as PCP - Cardiology (Cardiology)  Review of Systems having issues with elevated blood pressure and lower extremity edema   Objective    Vitals: Blood pressure 160/82 pulse 96 temperature 98.5 degrees pulse oximetry 96% weight 180 pounds BMI 31.89  Physical Exam  Skin: Warm and dry.  No cervical adenopathy.  Has goiter.  No carotid bruits.  Chest clear to auscultation.  Cardiac exam: Regular rate and rhythm without ectopy. Abdomen is soft nondistended without hepatosplenomegaly masses or tenderness.  Trace pitting edema of lower extremities.   Most recent functional status assessment: In your present state of health, do you have any difficulty performing the following activities: 11/26/2021  Hearing? N  Vision? N  Difficulty concentrating or making decisions? N  Walking or climbing stairs? N  Dressing or bathing? N  Doing errands, shopping? N  Preparing Food and eating ? N  Using the Toilet? N  In the past six months, have you accidently leaked urine? N  Do you have problems with loss of bowel control? N  Managing your Medications? N  Managing your Finances? N  Housekeeping or managing your Housekeeping? N  Some recent data might be hidden   Most recent fall risk assessment: Fall Risk  10/05/2021  Falls in the past year? 0  Comment -  Number falls in past yr: 0  Injury with Fall? 0  Risk for fall due to : No Fall Risks  Follow up Falls evaluation completed     Most recent depression screenings: PHQ 2/9 Scores 11/26/2021 10/05/2021  PHQ - 2 Score 0 0  PHQ- 9 Score 0 0   Most recent cognitive screening: 6CIT Screen 11/26/2021  What Year? 0 points  What month? 0 points  What time? 0 points  Count back from 20 0 points  Months in reverse 0 points  Repeat phrase 0 points  Total Score 0       Assessment & Plan     Elevated blood pressure reading.  History of essential hypertension-try carvedilol 12.5 mg daily.  Take Lasix 20 mg daily for dependent edema.  Patient has follow-up soon with Dr. Harrell Gave   BMI 31-needs to lose weight  History of anxiety  History of left knee arthroplasty  Goiter followed by Dr. Dwyane Dee  History of tubular adenoma removed by Dr. Oletta Lamas.  Needs colonoscopy follow-up.  Would like to see Dr. Collene Mares.  Records were faxed there in 2021.  Dr. Ronita Hipps sees patient for GYN evaluation.  Hyperlipidemia treated with Crestor 5 mg 3 times a week but total cholesterol is 210 and LDL cholesterol is 124.  Last year her lipids were normal.  History of mild impaired glucose tolerance.  Last year hemoglobin A1c 5.8% and now it is excellent at 5.6%.    Annual wellness visit done today including the all of the following: Reviewed patient's Family Medical History Reviewed and updated list of patient's medical providers Assessment of cognitive  impairment was done Assessed patient's functional ability Established a written schedule for health screening [place] Completed and Reviewed  Discussed health benefits of physical activity, and encouraged her to engage in regular exercise appropriate for her age and condition.         {I, Elby Showers, MD, have reviewed all documentation for this visit. The documentation on 12/29/21 for the exam, diagnosis, procedures, and orders are all accurate and complete.   Angus Seller, CMA

## 2021-11-27 ENCOUNTER — Ambulatory Visit (INDEPENDENT_AMBULATORY_CARE_PROVIDER_SITE_OTHER): Payer: Medicare Other | Admitting: Podiatry

## 2021-11-27 DIAGNOSIS — L600 Ingrowing nail: Secondary | ICD-10-CM

## 2021-11-27 LAB — MICROALBUMIN / CREATININE URINE RATIO
Creatinine, Urine: 163 mg/dL (ref 20–275)
Microalb Creat Ratio: 2 mcg/mg creat (ref <30)
Microalb, Ur: 0.3 mg/dL

## 2021-11-27 MED ORDER — POTASSIUM CHLORIDE CRYS ER 10 MEQ PO TBCR: 10.0000 meq | EXTENDED_RELEASE_TABLET | Freq: Every day | ORAL | 3 refills | Status: DC

## 2021-11-27 MED ORDER — FUROSEMIDE 20 MG PO TABS: 20.0000 mg | ORAL_TABLET | Freq: Every day | ORAL | 3 refills | Status: DC

## 2021-11-27 NOTE — Telephone Encounter (Signed)
Refills sent for a year

## 2021-11-27 NOTE — Progress Notes (Signed)
  Subjective:  [name], female    DOB: Feb 24, 1941,  MRN: [id number]  Chief Complaint  Patient presents with   Nail Problem    Toenails long, thick. Painful and difficult to trim.    80 y.o. female presents for f/u states that a couple of the nails have become ingrown Objective:  Physical Exam: warm, good capillary refill, nail exam onychomycosis of the toenails with pain to palpation, no trophic changes or ulcerative lesions, normal DP and PT pulses, and normal sensory exam. Pitting edema right leg No images are attached to the encounter.  Assessment:   1. Pain due to onychomycosis of toenails of both feet   2. Venous (peripheral) insufficiency    Plan:  Patient was evaluated and treated and all questions answered.  Onychomycosis  -Palliative debridement of ingrowing nails to patient relief -F/u PRN  No follow-ups on file.

## 2021-12-05 ENCOUNTER — Telehealth: Payer: Self-pay | Admitting: Internal Medicine

## 2021-12-05 MED ORDER — CARVEDILOL 12.5 MG PO TABS: 12.5000 mg | ORAL_TABLET | Freq: Two times a day (BID) | ORAL | 3 refills | Status: DC

## 2021-12-05 NOTE — Telephone Encounter (Signed)
Lisa [name] called back to say she was also dizzy when blood pressure was up.

## 2021-12-05 NOTE — Telephone Encounter (Signed)
Lisa [name] [phone]  [name] called to say her blood pressure is elevated for her, it started yesterday. She has no edema no, she stated she is taking lasix, potassium amlodpine.  Yesterday blood pressures  7:15 am 162/90     93 pulse 7:52 am  161/84 97 pulse 8:11 am 164/97     90 pulse 8:22 am  175/78 87 pulse 9:09 am 156/85 83 pulse  Took blood pressure medicine at between 3-4  3:55 pm  157/83  83 pulse 9:42 pm  156/85 83 pulse  Today's blood pressure readings 10:53 am 180/91 91 pulse  She is extremely worried she is going to have a stroke with this blood pressures, what should she do?

## 2021-12-05 NOTE — Telephone Encounter (Signed)
Patient has called with multiple blood pressure readings that are elevated. Adding Cavidilol 12.5 mg twice daily to Lasix and amlodipine with OV in 2 weeks. MJB, MD

## 2021-12-05 NOTE — Telephone Encounter (Signed)
Patient notified and has a fu scheduled.

## 2021-12-17 ENCOUNTER — Telehealth: Payer: Self-pay | Admitting: Internal Medicine

## 2021-12-17 NOTE — Telephone Encounter (Signed)
Called patient and let her know what Dr Baxley said and she verbalized understanding 

## 2021-12-17 NOTE — Telephone Encounter (Signed)
Lisa [name] [phone]  [name] called to say she has had little cough, so she has been taking Claritin, and it has helped. She has also been taking her Coreg as prescribed every 12 hours and the Claritin, she took them around 6 pm last night and was very nervous and jittery during the night and did not sleep well.  BP  8 am 177/93  pulse 65 Took BP medicine at 11;00 am BP 11:00  170/86  pulse 61  12:17 162/82   pulse 70  She wants to know if she should continue taking Coreg every 12 hours the pharmacist told her it could make her nervous and jittery, the Claritin has helped the cough. She has an appointment on Thursday for BP.

## 2021-12-18 ENCOUNTER — Telehealth: Payer: Self-pay | Admitting: Internal Medicine

## 2021-12-18 ENCOUNTER — Encounter: Payer: Self-pay | Admitting: Internal Medicine

## 2021-12-18 ENCOUNTER — Telehealth (INDEPENDENT_AMBULATORY_CARE_PROVIDER_SITE_OTHER): Payer: Medicare Other | Admitting: Internal Medicine

## 2021-12-18 VITALS — BP 151/75 | HR 56 | Temp 97.7°F

## 2021-12-18 DIAGNOSIS — R062 Wheezing: Secondary | ICD-10-CM | POA: Diagnosis not present

## 2021-12-18 DIAGNOSIS — R059 Cough, unspecified: Secondary | ICD-10-CM

## 2021-12-18 MED ORDER — HYDROCODONE BIT-HOMATROP MBR 5-1.5 MG/5ML PO SOLN: 5.0000 mL | Freq: Three times a day (TID) | ORAL | 0 refills | Status: DC | PRN

## 2021-12-18 MED ORDER — SPACER/AERO-HOLDING CHAMBERS DEVI: 1.0000 | Freq: Four times a day (QID) | 0 refills | Status: AC

## 2021-12-18 MED ORDER — ALBUTEROL SULFATE HFA 108 (90 BASE) MCG/ACT IN AERS: 2.0000 | INHALATION_SPRAY | Freq: Four times a day (QID) | RESPIRATORY_TRACT | 0 refills | Status: DC | PRN

## 2021-12-18 MED ORDER — PREDNISONE 10 MG PO TABS
ORAL_TABLET | ORAL | 0 refills | Status: DC
Start: 2021-12-18 — End: 2022-01-23

## 2021-12-18 MED ORDER — AZITHROMYCIN 250 MG PO TABS: ORAL_TABLET | ORAL | 0 refills | Status: AC

## 2021-12-18 NOTE — Telephone Encounter (Signed)
Appt made for mychart.

## 2021-12-18 NOTE — Telephone Encounter (Signed)
Lisa [name] [id number]  Vergene called to say she has wheezing in her chest, and a cough for at least 3 days, she has been taking Delsym. COVID test was negative. No Fever BP before breakfast 154/78, pulse 54

## 2021-12-19 ENCOUNTER — Encounter: Payer: Self-pay | Admitting: Internal Medicine

## 2021-12-19 ENCOUNTER — Telehealth: Payer: Self-pay | Admitting: Internal Medicine

## 2021-12-19 ENCOUNTER — Ambulatory Visit
Admission: RE | Admit: 2021-12-19 | Discharge: 2021-12-19 | Disposition: A | Discharge status: Home / Self Care | Payer: Medicare Other | Source: Ambulatory Visit | Attending: Internal Medicine | Admitting: Internal Medicine

## 2021-12-19 ENCOUNTER — Other Ambulatory Visit: Payer: Self-pay

## 2021-12-19 DIAGNOSIS — R0602 Shortness of breath: Secondary | ICD-10-CM | POA: Diagnosis not present

## 2021-12-19 DIAGNOSIS — R059 Cough, unspecified: Secondary | ICD-10-CM | POA: Diagnosis not present

## 2021-12-19 NOTE — Telephone Encounter (Signed)
Lisa [name] called wanting a chest xray today. She stated she walked to her sisters car and got short of breath. It was mu understanding she has not started the medication yet, that was sent in yesterday, she did do the inhaler after she got short of breath, says she is worse today. I suggested she go to urgent care. She just wants a Xray.

## 2021-12-19 NOTE — Telephone Encounter (Signed)
Duplicate encounter. See other note in chart.

## 2021-12-19 NOTE — Telephone Encounter (Signed)
Early this afternoon, patient called complaining of SOB when walking out to her sister's car and was asking for a CXR to be done. She was told that I was out of the office and no one else here could order the CXR. She then wanted someone contacted. I subsequently returned to the office as I had been at lunch and called her back.My staff did not know when I would be returning as I had errands to complete today. I offered to see her this afternoon but she  has declined OV.  She wants CXR ordered. I have entered order for STAT CXR. Tells me she will be seeing Dr. Harrell Gave tomorrow.      She had  virtual visit  yesterday with me complaining of cough and SOB. She says she was told  by her pharmacist that Coreg could make her nervous and jittery.  I have not had Coreg cause patients these symptoms before. I had told her to take Coreg once daily until she could she Dr. Harrell Gave which I understood  was to be on January 25th.  A pulse oximeter was apparently not available to her yesterday.   Yesterday after her visit, we sent in a Z-pak, albuterol inhaler which her sister recommended with a spacer,Hycodan cough medication, and a prednisone 10 mg 6 day dose pack. She told me edema was improving. She also requested to see Dr. Lamonte Sakai with Pulmonary as she was concerned about allergies. Told [facility] appropriate [facility] Allergy testing and she asked for a referral there. This will be done.

## 2021-12-19 NOTE — Progress Notes (Addendum)
° °  Subjective:    [name], female    DOB: May 16, 1941, 80 y.o.   MRN: [id number]  HPI 80 year old Female with recent issues with dependent edema seen by Dr. Oval Linsey.  Has made progress with edema recently and has follow-up appointment with Dr. Oval Linsey on December 22.  Patient is complaining of wheezing in her chest, cough for 3 days not relieved with Delsym.  COVID test was negative.  Apparently her sister is with her today and is asking that she receive an inhaler with a spacer.  Patient also wants to see a pulmonologist.  However it may be more appropriate for her to see allergist instead.  She has not had a prior history of asthma.  Issues with lower extremity edema began in the summer.  Had COVID-19 in late July.  She was treated with Paxlovid.  For years has complained of seasonal allergic rhinitis but has not had formal allergy testing.    History of multinodular goiter followed by Dr. Dwyane Dee.  History of elevated LDL but started statin 3 times a week in 2021.  History of essential hypertension, glaucoma and impaired glucose tolerance.  Had left knee arthroplasty by Dr.Alusio in 2021 and did well.  Due to the Coronavirus pandemic patient is seen by interactive audio and video telecommunications.  She is at her home and I am at my office.  She is agreeable to visit in this format today.  She is identified using 2 identifiers as Lisa [name], a longstanding patient in this practice.  At one point in the interview, video failed and we had to continue with audio only.  Review of Systems see above     Objective:   Physical Exam Not heard to be wheezing or significantly dyspneic.  Slight cough.  Reports blood pressure to be 151/75 and pulse 76.  Reports afebrile with temp 97.7       Assessment & Plan:  History of COVID-19 in July 2022  Lower extremity edema seen by Dr. Harrell Gave  Essential hypertension  Anxiety  Wheezing-etiology unclear  Complaint of  wheezing and dyspnea-possible new lower respiratory infection versus allergic rhinitis.  Patient asking for albuterol inhaler with spacer.  Have also prescribed tapering course of prednisone 10 mg starting with 6 tablets day 1 and decreasing by 10 mg daily i.e. 6-5-4-3-2-1 taper.  I have prescribed Hycodan for cough.  Referral will be made to [facility] [place] for appropriate allergy testing regarding wheezing.  She has an appointment this week with Dr. Harrell Gave regarding lower extremity edema.  Time spent with this visit is 20 minutes including interviewing patient, medical decision making, E scribing medications, extensive chart review

## 2021-12-20 ENCOUNTER — Encounter (HOSPITAL_BASED_OUTPATIENT_CLINIC_OR_DEPARTMENT_OTHER): Payer: Self-pay | Admitting: Cardiology

## 2021-12-20 ENCOUNTER — Ambulatory Visit (INDEPENDENT_AMBULATORY_CARE_PROVIDER_SITE_OTHER): Payer: Medicare Other | Admitting: Cardiology

## 2021-12-20 ENCOUNTER — Telehealth: Payer: Self-pay | Admitting: Internal Medicine

## 2021-12-20 ENCOUNTER — Ambulatory Visit: Payer: Medicare Other | Admitting: Internal Medicine

## 2021-12-20 VITALS — BP 178/62 | HR 94 | Ht 63.0 in | Wt 186.2 lb

## 2021-12-20 DIAGNOSIS — R600 Localized edema: Secondary | ICD-10-CM | POA: Diagnosis not present

## 2021-12-20 DIAGNOSIS — E7800 Pure hypercholesterolemia, unspecified: Secondary | ICD-10-CM

## 2021-12-20 DIAGNOSIS — Z79899 Other long term (current) drug therapy: Secondary | ICD-10-CM

## 2021-12-20 DIAGNOSIS — I10 Essential (primary) hypertension: Secondary | ICD-10-CM

## 2021-12-20 DIAGNOSIS — Z7189 Other specified counseling: Secondary | ICD-10-CM | POA: Diagnosis not present

## 2021-12-20 MED ORDER — MONTELUKAST SODIUM 10 MG PO TABS: 10.0000 mg | ORAL_TABLET | Freq: Every day | ORAL | 3 refills | Status: DC

## 2021-12-20 MED ORDER — VALSARTAN 40 MG PO TABS: 40.0000 mg | ORAL_TABLET | Freq: Every day | ORAL | 3 refills | Status: DC

## 2021-12-20 NOTE — Patient Instructions (Signed)
Medication Instructions:  Stop carvedilol Stop potassium supplement Start valsartan 40 mg daily Continue amlodipine, furosemide  *If you need a refill on your cardiac medications before your next appointment, please call your pharmacy*   Lab Work: Your provider has recommended lab work in 3 weeks (BMP). Please have this collected at [facility] at [place]. The lab is open 8:00 am - 4:30 pm. Please avoid 12:00p - 1:00p for lunch hour. You do not need an appointment. Please go to [address] [place], [place] [address]. This is in the Primary Care office on the 3rd floor, let them know you are there for blood work and they will direct you to the lab.  If you have labs (blood work) drawn today and your tests are completely normal, you will receive your results only by: Lisa [name] (if you have MyChart) OR A paper copy in the mail If you have any lab test that is abnormal or we need to change your treatment, we will call you to review the results.   Testing/Procedures: None ordered today   Follow-Up: At [facility], you and your health needs are our priority.  As part of our continuing mission to provide you with exceptional heart care, we have created designated Provider Care Teams.  These Care Teams include your primary Cardiologist (physician) and Advanced Practice Providers (APPs -  Physician Assistants and Nurse Practitioners) who all work together to provide you with the care you need, when you need it.  We recommend signing up for the patient portal called "MyChart".  Sign up information is provided on this After Visit Summary.  MyChart is used to connect with patients for Virtual Visits (Telemedicine).  Patients are able to view lab/test results, encounter notes, upcoming appointments, etc.  Non-urgent messages can be sent to your provider as well.   To learn more about what you can do with MyChart, go to [email or web address].    Your next  appointment:   3 month(s)  The format for your next appointment:   In Person  Provider:   Buford Dresser, MD    Other Instructions We ideally want blood pressure around 130/80 or less long term, but in the short term I'd be happy with the top number around 130s-140s. It may take a few weeks to come down. If it's still high after a few weeks let me know.   Your chest x ray looks good to me. You could consider asking Dr. Renold Genta if singulair might be a good option for you with the allergies and wheezing.

## 2021-12-20 NOTE — Progress Notes (Signed)
Cardiology Office Note:    Date:  12/20/2021   ID:  Lisa [name], Lisa [name] [birth year], MRN [id number]  PCP:  Elby Showers, MD  Cardiologist:  Buford Dresser, MD  Referring MD: Elby Showers, MD   CC: follow up  History of Present Illness:    Lisa [name] is a 80 y.o. female with a hx of asthma, hyperlipidemia, hypertension, obesity, arthritis, cervical cancer, and goiter, who is seen for follow up today. I initially met her 10/23/21 as a new consult at the request of Baxley, Cresenciano Lick, MD for the evaluation and management of tachycardia.  Cardiovascular risk factors: Prior clinical ASCVD: None Comorbid conditions: hypertension, hyperlipidemia Metabolic syndrome/Obesity:  Obesity, Chronic inflammatory conditions: none Tobacco use history: Not a smoker (2-3 months in college) Alcohol consumption: Every now and then Family history: mother and father had hypertension, father had heart disease  Today, she is accompanied with her sister. Her sister says that she has been having blood pressure issues since Maxzide is no longer available (name brand). Her swelling is resolved on lasix. She started carvedilol but was not tolerating this well, was told to cut back to once/day carvedilol.   She experienced dizziness recently. After she took the a medicine she started experiencing jittering and nervousness. She had also took Claritin for her seasonal allergy symptoms. Her nervousness has been resolved as of this time.  Her sister says that she has been having wheezing at night due to her seasonal allergies, thy also ruled out COVID since she had tested negative. She is allergic to Codeine, so she could not have a cough medicine.  Her sister brought a blood pressure log, and her blood pressure at home yesterday was 154/78. Prior to that day she had a blood pressure of 151/75, pulse 56 bpm after she had carvedilol. On Dec 7, she had 180/91, pulse 91.  Of note, she has an inhaler to  manage her asthma. She does not feel that this has been helping.  She denies any palpitations. No lightheadedness, headaches, orthopnea, PND, or lower extremity edema.    Past Medical History:  Diagnosis Date   Allergy    Arthritis    Cancer ([place])    cervical   Complication of anesthesia    Difficulty waking up after hysterectomy in 1973   Goiter    Headache    Sinus headaches   Hyperlipidemia    Hypertension    Obesity    Osteopenia    Vitamin D deficiency     Past Surgical History:  Procedure Laterality Date   ABDOMINAL HYSTERECTOMY     EYE SURGERY     HAMMER TOE SURGERY  12/02   right   TOTAL KNEE ARTHROPLASTY Left 08/21/2020   Procedure: TOTAL KNEE ARTHROPLASTY;  Surgeon: Gaynelle Arabian, MD;  Location: [facility];  Service: Orthopedics;  Laterality: Left;  67mn   WISDOM TOOTH EXTRACTION      Current Medications: Current Outpatient Medications on File Prior to Visit  Medication Sig   acetaminophen (TYLENOL) 500 MG tablet Take 500-1,000 mg by mouth every 6 (six) hours as needed for mild pain or moderate pain.   albuterol (VENTOLIN HFA) 108 (90 Base) MCG/ACT inhaler Inhale 2 puffs into the lungs every 6 (six) hours as needed for wheezing or shortness of breath.   amLODipine (NORVASC) 5 MG tablet TAKE 1 TABLET(5 MG) BY MOUTH DAILY   azithromycin (ZITHROMAX) 250 MG tablet Take 2 tablets on day 1, then 1 tablet daily on  days 2 through 5   fluticasone (FLONASE) 50 MCG/ACT nasal spray Place 2 sprays into both nostrils daily as needed for allergies or rhinitis.   furosemide (LASIX) 20 MG tablet Take 1 tablet (20 mg total) by mouth daily.   loratadine (CLARITIN) 10 MG tablet Take 10 mg by mouth daily as needed.   predniSONE (DELTASONE) 10 MG tablet Take 6 first day, take 5 second day, take 4 third day, take 3 fourth day, take 2 day 5 and 1 day 6   Spacer/Aero-Holding Chambers DEVI 1 each by Does not apply route in the morning, at noon, in the evening, and at bedtime.   No current  facility-administered medications on file prior to visit.     Allergies:   Penicillins, Codeine, Darvon, and Tall ragweed   Social History   Tobacco Use   Smoking status: Never   Smokeless tobacco: Never  Vaping Use   Vaping Use: Never used  Substance Use Topics   Alcohol use: No    Comment: Wine once or twice a year   Drug use: No    Family History: family history includes Heart disease in her father; Hypertension in her father and mother; Thyroid disease in her father.  ROS:   Please see the history of present illness. (+) Wheezing at night (+) Coughing (+) Seasonal Allergies (+) Shortness of Breath (+) Near Syncope (due to her fatigue from her seasonal symptoms) (+) Vertigo/Dizziness All other systems are reviewed and negative.     EKGs/Labs/Other Studies Reviewed:    The following studies were reviewed today:  LE Venous DVT 08/16/2021: Summary:  RIGHT:  - There is no evidence of deep vein thrombosis in the lower extremity.     - No cystic structure found in the popliteal fossa.     LEFT:  - No evidence of common femoral vein obstruction.   EKG:  EKG is personally reviewed.   12/20/21: not ordered today 10/23/2021: NSR at 99 bpm, nonspecific ST pattern  Recent Labs: 08/10/2021: Brain Natriuretic Peptide 21 11/20/2021: ALT 10; BUN 20; Creat 0.80; Hemoglobin 12.8; Platelets 230; Potassium 4.1; Sodium 143; TSH 1.14   Recent Lipid Panel    Component Value Date/Time   CHOL 210 (H) 11/20/2021 0949   TRIG 49 11/20/2021 0949   HDL 71 11/20/2021 0949   CHOLHDL 3.0 11/20/2021 0949   VLDL 11 01/14/2017 1136   LDLCALC 124 (H) 11/20/2021 0949    Physical Exam:    VS:  BP (!) 178/62    Pulse 94    Ht 5' 3"  (1.6 m)    Wt 186 lb 3.2 oz (84.5 kg)    SpO2 92%    BMI 32.98 kg/m     Wt Readings from Last 3 Encounters:  12/20/21 186 lb 3.2 oz (84.5 kg)  11/26/21 180 lb (81.6 kg)  10/23/21 185 lb 9.6 oz (84.2 kg)    GEN: Well nourished, well developed in no acute  distress HEENT: Normal, moist mucous membranes NECK: No JVD CARDIAC: regular rhythm, normal S1 and S2, no rubs or gallops. No murmur. VASCULAR: Radial and DP pulses 2+ bilaterally. No carotid bruits RESPIRATORY:  Clear to auscultation without rales, wheezing or rhonchi  ABDOMEN: Soft, non-tender, non-distended MUSCULOSKELETAL:  Ambulates independently SKIN: Warm and dry, no edema NEUROLOGIC:  Alert and oriented x 3. No focal neuro deficits noted. PSYCHIATRIC:  Normal affect    ASSESSMENT:    1. Primary hypertension   2. Medication management   3. Pure hypercholesterolemia  4. Bilateral leg edema   5. Counseling on health promotion and disease prevention     PLAN:    Bilateral LE edema: -resolved on lasix -echo without high risk findings  Hypertension -on amlodipine 5 mg, furosemide 20 mg  -started on carvedilol, but did not tolerate. HR in the 50s based on home readings, even when taking once a day -discussed options. Will stop carvedilol, start ARB. Stop potassium with start of ARB -recheck BMET in several weeks  Hypercholesterolemia -continue rosuvastatin  Sinus tachycardia, history of -borderline, does not require treatment  Wheezing, allergies -did not tolerate claritin. Avoid agents (sudafed, etc) that can raise BP. Defer to Dr. Renold Genta, but with her nighttime wheezing wonder if singulair might be an option.  Cardiac risk counseling and prevention recommendations: -recommend heart healthy/Mediterranean diet, with whole grains, fruits, vegetable, fish, lean meats, nuts, and olive oil. Limit salt. -recommend moderate walking, 3-5 times/week for 30-50 minutes each session. Aim for at least 150 minutes.week. Goal should be pace of 3 miles/hours, or walking 1.5 miles in 30 minutes -recommend avoidance of tobacco products. Avoid excess alcohol. -ASCVD risk score: The 10-year ASCVD risk score (Arnett DK, et al., 2019) is: 57.4%   Values used to calculate the score:      Age: 66 years     Sex: Female     Is Non-Hispanic African American: Yes     Diabetic: Yes     Tobacco smoker: No     Systolic Blood Pressure: 570 mmHg     Is BP treated: Yes     HDL Cholesterol: 71 mg/dL     Total Cholesterol: 210 mg/dL    Plan for follow up: 3 months or sooner as needed.  Buford Dresser, MD, PhD, [facility]    Medication Adjustments/Labs and Tests Ordered: Current medicines are reviewed at length with the patient today.  Concerns regarding medicines are outlined above.   Orders Placed This Encounter  Procedures   Basic metabolic panel     Meds ordered this encounter  Medications   valsartan (DIOVAN) 40 MG tablet    Sig: Take 1 tablet (40 mg total) by mouth daily.    Dispense:  90 tablet    Refill:  3     Patient Instructions  Medication Instructions:  Stop carvedilol Stop potassium supplement Start valsartan 40 mg daily Continue amlodipine, furosemide  *If you need a refill on your cardiac medications before your next appointment, please call your pharmacy*   Lab Work: Your provider has recommended lab work in 3 weeks (BMP). Please have this collected at [facility] at [place]. The lab is open 8:00 am - 4:30 pm. Please avoid 12:00p - 1:00p for lunch hour. You do not need an appointment. Please go to [address] [place], [place] [address]. This is in the Primary Care office on the 3rd floor, let them know you are there for blood work and they will direct you to the lab.  If you have labs (blood work) drawn today and your tests are completely normal, you will receive your results only by: [place] (if you have MyChart) OR A paper copy in the mail If you have any lab test that is abnormal or we need to change your treatment, we will call you to review the results.   Testing/Procedures: None ordered today   Follow-Up: At [facility], you and your health needs are our priority.   As part of our  continuing mission to provide you with exceptional heart care, we have created designated Provider Care Teams.  These Care Teams include your primary Cardiologist (physician) and Advanced Practice Providers (APPs -  Physician Assistants and Nurse Practitioners) who all work together to provide you with the care you need, when you need it.  We recommend signing up for the patient portal called "MyChart".  Sign up information is provided on this After Visit Summary.  MyChart is used to connect with patients for Virtual Visits (Telemedicine).  Patients are able to view lab/test results, encounter notes, upcoming appointments, etc.  Non-urgent messages can be sent to your provider as well.   To learn more about what you can do with MyChart, go to [email or web address].    Your next appointment:   3 month(s)  The format for your next appointment:   In Person  Provider:   Buford Dresser, MD    Other Instructions We ideally want blood pressure around 130/80 or less long term, but in the short term I'd be happy with the top number around 130s-140s. It may take a few weeks to come down. If it's still high after a few weeks let me know.   Your chest x ray looks good to me. You could consider asking Dr. Renold Genta if singulair might be a good option for you with the allergies and wheezing.        I,Tinashe Casino,acting as a Education administrator for [employer], MD.,have documented all relevant documentation on the behalf of Buford Dresser, MD,as directed by  Buford Dresser, MD while in the presence of Buford Dresser, MD.   I, Buford Dresser, MD, have reviewed all documentation for this visit. The documentation on 12/20/21 for the exam, diagnosis, procedures, and orders are all accurate and complete.   Signed, Buford Dresser, MD PhD 12/20/2021 4:06 PM    [facility]

## 2021-12-20 NOTE — Telephone Encounter (Signed)
I spoke with patient today. She saw Dr. Harrell Gave this am. Her CXR yesterday was normal. Dr. Harrell Gave made a good suggestion about trying Singulair. I will send that in to [facility]. We will make referral to Allergy and Asthma.

## 2021-12-24 NOTE — Patient Instructions (Addendum)
Start Prednisone taper as directed.  Have sent in albuterol inhaler with spacer.  Referral to be made to Allergy and asthma center.  Follow-up with Dr. Harrell Gave later this week for dependent edema.

## 2021-12-29 NOTE — Patient Instructions (Addendum)
Adding carvedilol 12.5 mg daily.  Take Lasix 20 mg daily for dependent edema.  Total and LDL cholesterol levels are elevated.  Watch diet.  Try to get more exercise.  Have scheduled follow-up in 6 months.  Has upcoming appointment with Dr. Harrell Gave.

## 2022-01-05 ENCOUNTER — Other Ambulatory Visit (HOSPITAL_COMMUNITY): Payer: Self-pay

## 2022-01-07 ENCOUNTER — Other Ambulatory Visit (HOSPITAL_COMMUNITY): Payer: Self-pay

## 2022-01-11 DIAGNOSIS — Z79899 Other long term (current) drug therapy: Secondary | ICD-10-CM | POA: Diagnosis not present

## 2022-01-12 LAB — BASIC METABOLIC PANEL
BUN/Creatinine Ratio: 20 (ref 12–28)
BUN: 20 mg/dL (ref 8–27)
CO2: 22 mmol/L (ref 20–29)
Calcium: 10.1 mg/dL (ref 8.7–10.3)
Chloride: 105 mmol/L (ref 96–106)
Creatinine, Ser: 0.98 mg/dL (ref 0.57–1.00)
Glucose: 132 mg/dL — ABNORMAL HIGH (ref 70–99)
Potassium: 4 mmol/L (ref 3.5–5.2)
Sodium: 150 mmol/L — ABNORMAL HIGH (ref 134–144)
eGFR: 59 mL/min/{1.73_m2} — ABNORMAL LOW (ref >59)

## 2022-01-14 ENCOUNTER — Other Ambulatory Visit (HOSPITAL_BASED_OUTPATIENT_CLINIC_OR_DEPARTMENT_OTHER): Payer: Self-pay

## 2022-01-14 DIAGNOSIS — Z79899 Other long term (current) drug therapy: Secondary | ICD-10-CM

## 2022-01-15 IMAGING — CR DG CHEST 2V
3 series · 3 of 3 positions shown · non-contrast
Comparison: Chest radiographs 08/10/2021. Thyroid ultrasound
11/03/2018.

CLINICAL DATA: Acute shortness of breath with exertion today.
Shortness of breath with exertion for several days, fever, cough and
wheezing.

EXAM:
CHEST - 2 VIEW

[w chest pa (1 of 2)]
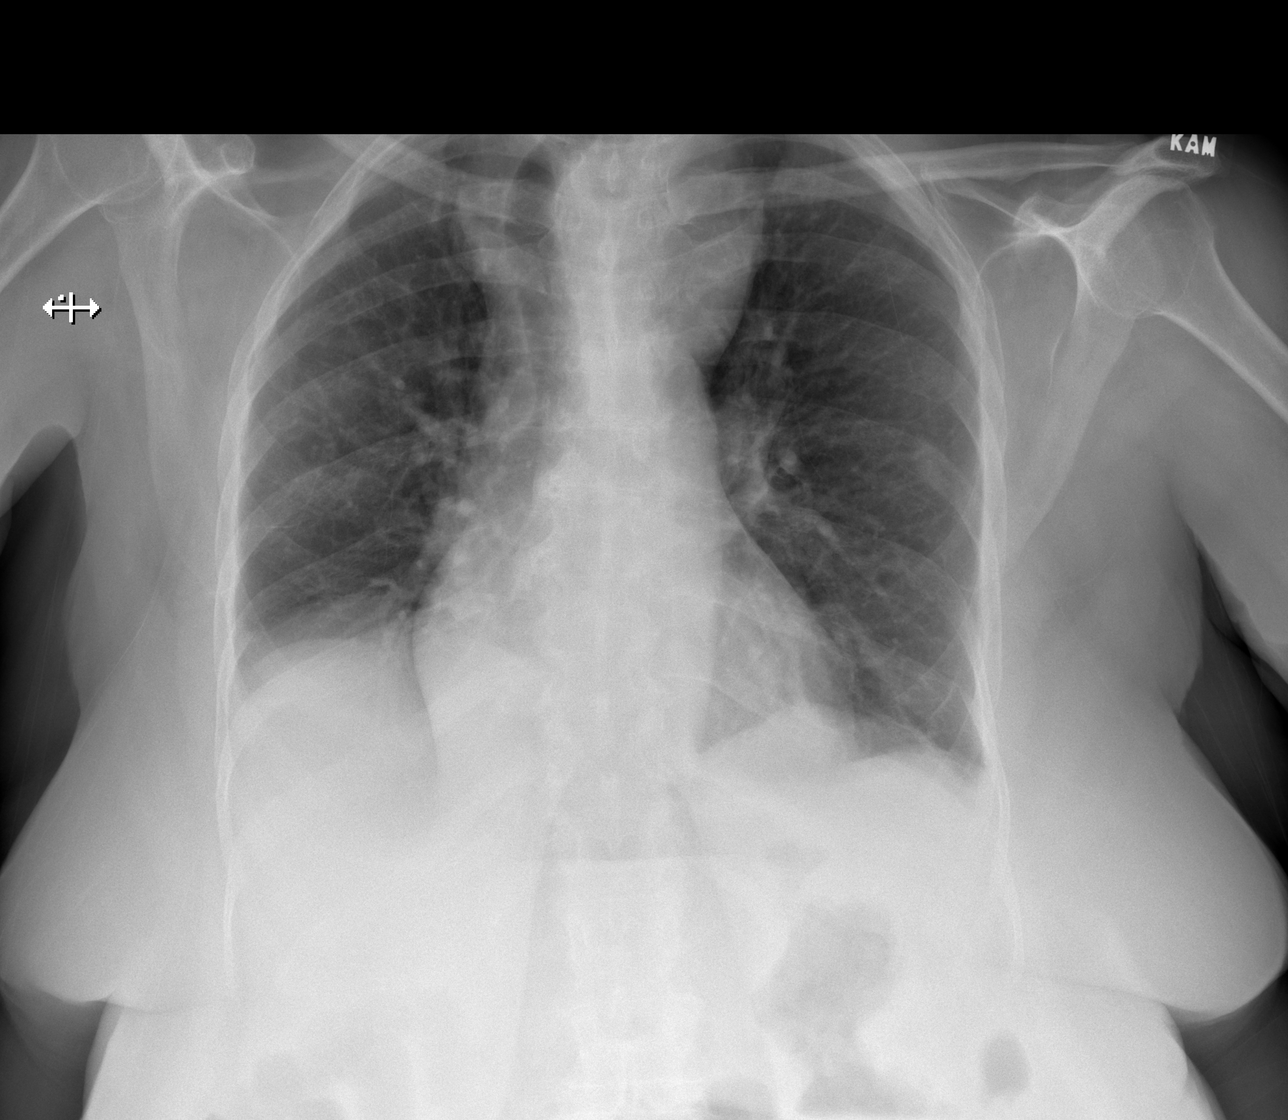

[w chest pa (2 of 2)]
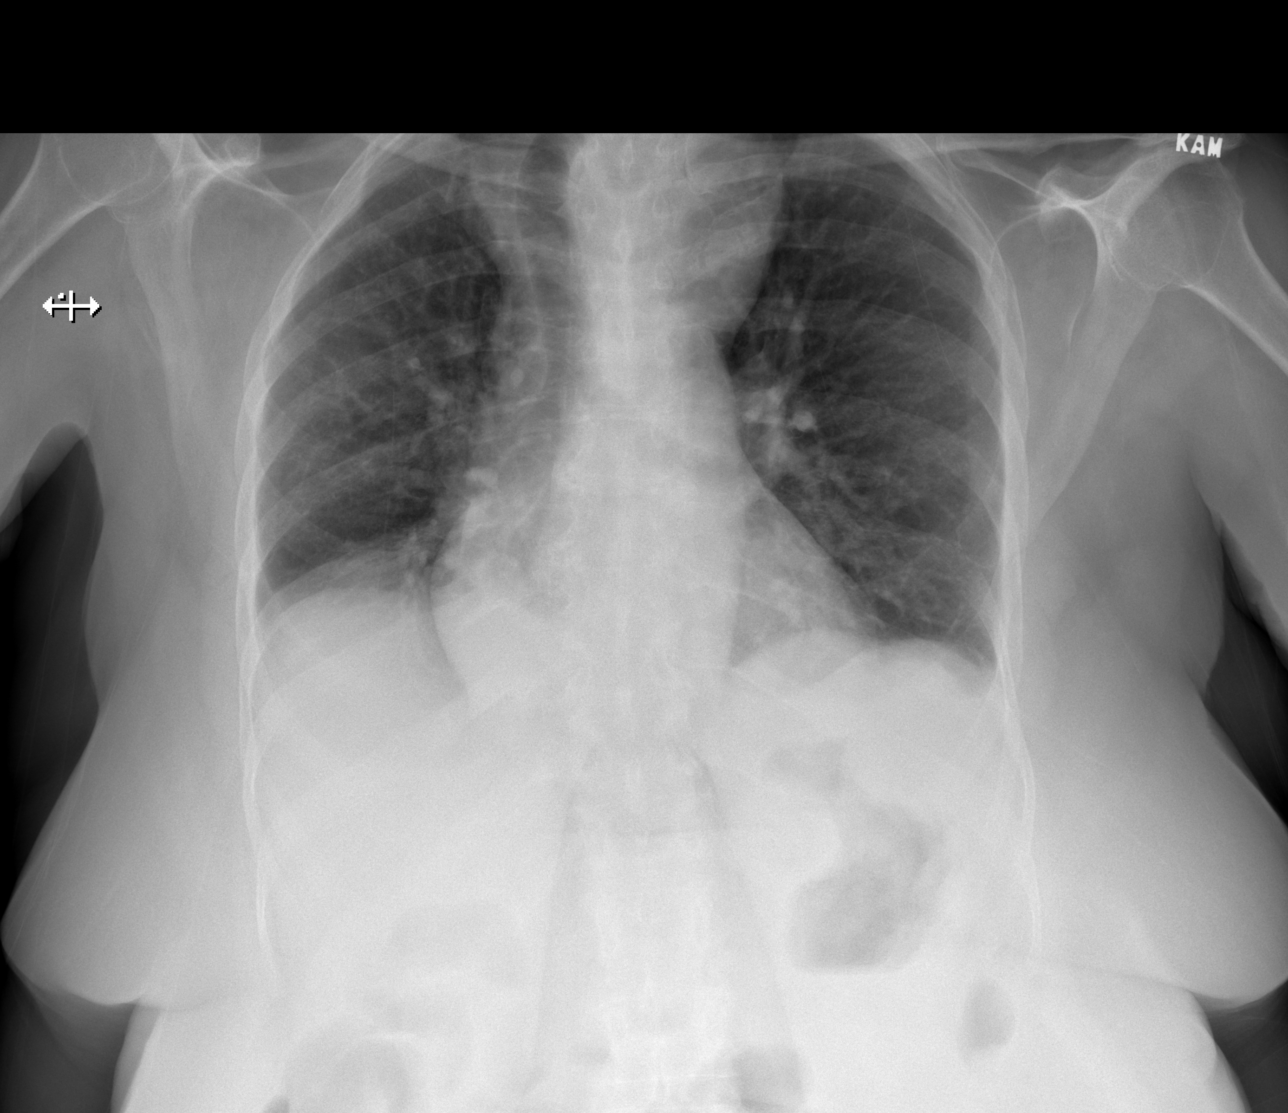

[w chest lat]
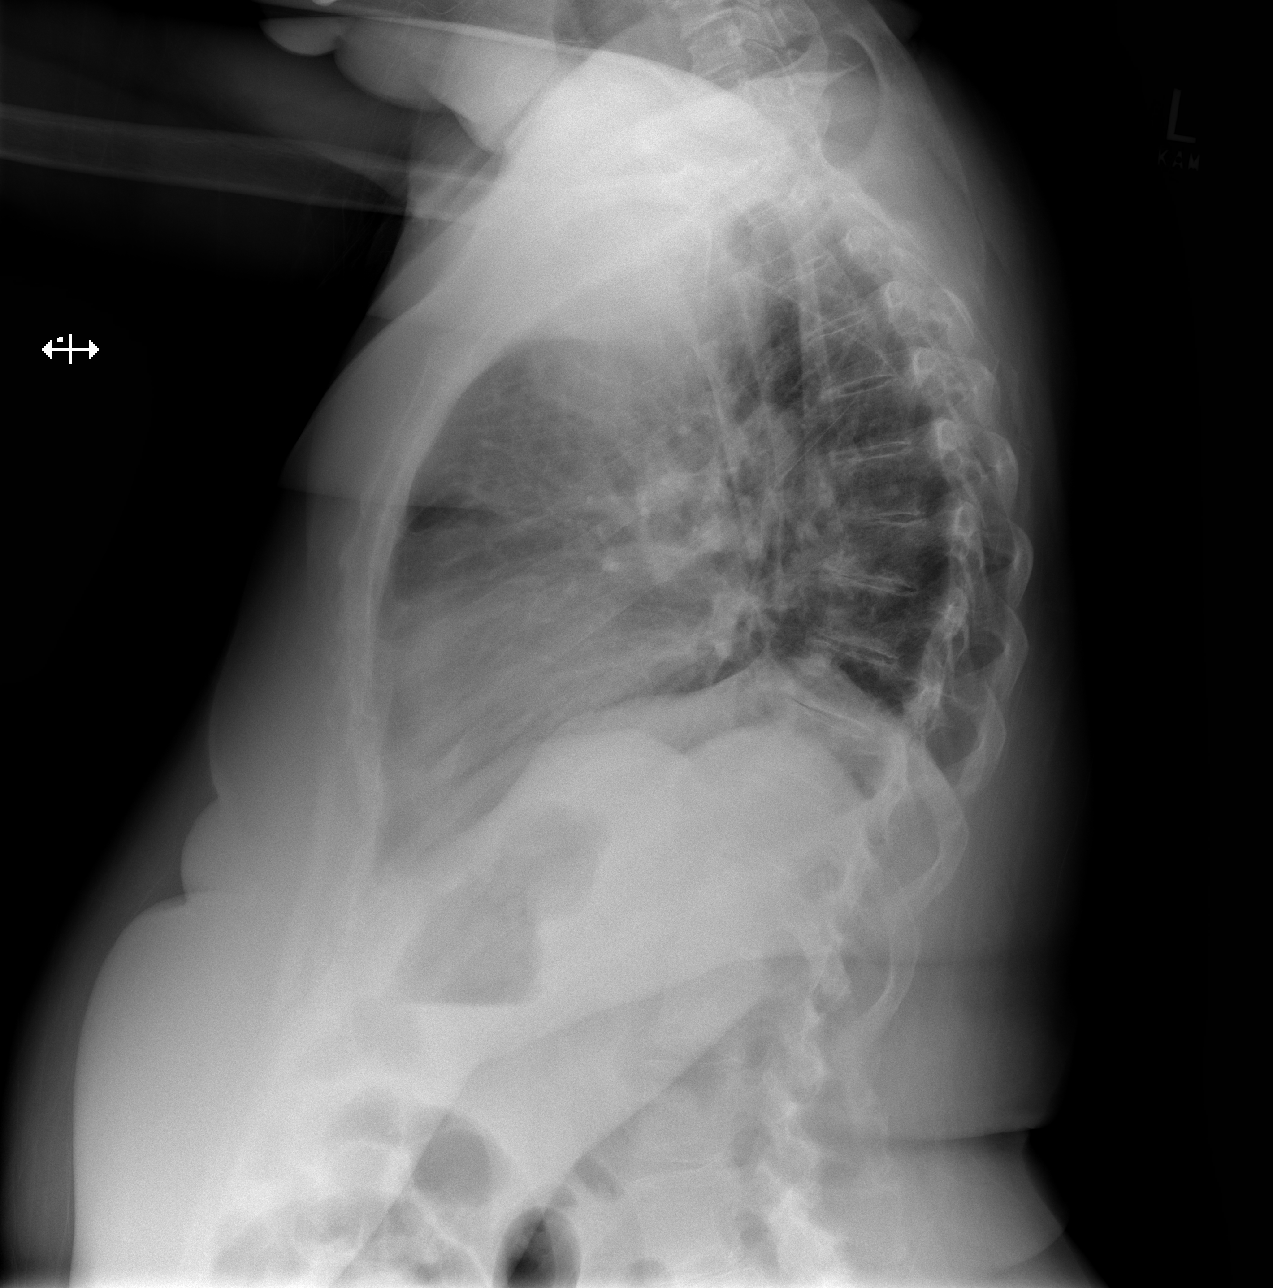

[3 of 3 positions shown; findings below may reference images not displayed]

FINDINGS: The heart size and mediastinal contours are stable. There is stable
chronic tracheal deviation to the right with prominent left
paratracheal opacity corresponding with known thyroid goiter and a
previously biopsied nodule. The lungs remain clear. There is no
pleural effusion or pneumothorax. There are stable degenerative
changes throughout the spine.
IMPRESSION: Stable chest without evidence of acute cardiopulmonary process.
Stable superior mediastinal widening and tracheal deviation
attributed to known left thyroid nodule/goiter.

## 2022-01-23 ENCOUNTER — Ambulatory Visit (INDEPENDENT_AMBULATORY_CARE_PROVIDER_SITE_OTHER): Payer: Medicare PPO | Admitting: Cardiology

## 2022-01-23 ENCOUNTER — Encounter (HOSPITAL_BASED_OUTPATIENT_CLINIC_OR_DEPARTMENT_OTHER): Payer: Self-pay | Admitting: Cardiology

## 2022-01-23 ENCOUNTER — Other Ambulatory Visit: Payer: Self-pay

## 2022-01-23 VITALS — BP 146/62 | HR 114 | Ht 63.0 in | Wt 196.0 lb

## 2022-01-23 DIAGNOSIS — I10 Essential (primary) hypertension: Secondary | ICD-10-CM

## 2022-01-23 DIAGNOSIS — R600 Localized edema: Secondary | ICD-10-CM

## 2022-01-23 DIAGNOSIS — Z79899 Other long term (current) drug therapy: Secondary | ICD-10-CM

## 2022-01-23 DIAGNOSIS — R000 Tachycardia, unspecified: Secondary | ICD-10-CM | POA: Diagnosis not present

## 2022-01-23 DIAGNOSIS — Z9189 Other specified personal risk factors, not elsewhere classified: Secondary | ICD-10-CM

## 2022-01-23 DIAGNOSIS — Z7189 Other specified counseling: Secondary | ICD-10-CM

## 2022-01-23 MED ORDER — VALSARTAN 80 MG PO TABS: 80.0000 mg | ORAL_TABLET | Freq: Every day | ORAL | 3 refills | Status: DC

## 2022-01-23 NOTE — Progress Notes (Signed)
Cardiology Office Note:    Date:  01/23/2022   ID:  Lisa, Lisa [name] [birth year], MRN [id number]  PCP:  Elby Showers, MD  Cardiologist:  Buford Dresser, MD  Referring MD: Elby Showers, MD   CC: follow up  History of Present Illness:    Lisa Lisa [name] is a 81 y.o. female with a hx of asthma, hyperlipidemia, hypertension, obesity, arthritis, cervical cancer, and goiter, who is seen for follow up today. I initially met her 10/23/21 as a new consult at the request of Lisa [name], Lisa Lick, MD for the evaluation and management of tachycardia.  Cardiovascular risk factors: Prior clinical ASCVD: None Comorbid conditions: hypertension, hyperlipidemia Metabolic syndrome/Obesity:  Obesity, Chronic inflammatory conditions: none Tobacco use history: Not a smoker (2-3 months in college) Alcohol consumption: Every now and then Family history: mother and father had hypertension, father had heart disease  Today: She is accompanied by her sister. Overall, she is feeling better. Per her BP log her readings have averaged in the 140s at home, and have been as low as 097 systolic. Her heart rate has averaged in the 70s-90s.  She continues to have LE edema. Lately she has not been elevating her legs. However, she is wearing compression socks.   When she had her blood work done last week she was instructed to stay hydrated. Today she asks how much water she should be drinking on her current medication because she was previously told to drink as much as possible.   She states she has not been exercising like she should, but she plans to continue working on this and walk more often.  Additionally, she endorses pain in her bilateral elbows. She needs to constantly push herself off of her new mattress. Her sister notes that while sleeping her left hand is hanging over the side of the bed. Her left 3rd and 4th fingers are swollen with onset a couple weeks ago. This is still present but she notes  it has improved lately.   She denies any palpitations, chest pain, or shortness of breath. No lightheadedness, headaches, syncope, orthopnea, PND, or exertional symptoms.  She is scheduled to meet with an allergist soon.  Past Medical History:  Diagnosis Date   Allergy    Arthritis    Cancer ([place])    cervical   Complication of anesthesia    Difficulty waking up after hysterectomy in 1973   Goiter    Headache    Sinus headaches   Hyperlipidemia    Hypertension    Obesity    Osteopenia    Vitamin D deficiency     Past Surgical History:  Procedure Laterality Date   ABDOMINAL HYSTERECTOMY     EYE SURGERY     HAMMER TOE SURGERY  12/02   right   TOTAL KNEE ARTHROPLASTY Left 08/21/2020   Procedure: TOTAL KNEE ARTHROPLASTY;  Surgeon: Gaynelle Arabian, MD;  Location: [facility];  Service: Orthopedics;  Laterality: Left;  5mn   WISDOM TOOTH EXTRACTION      Current Medications: Current Outpatient Medications on File Prior to Visit  Medication Sig   acetaminophen (TYLENOL) 500 MG tablet Take 500-1,000 mg by mouth every 6 (six) hours as needed for mild pain or moderate pain.   albuterol (VENTOLIN HFA) 108 (90 Base) MCG/ACT inhaler Inhale 2 puffs into the lungs every 6 (six) hours as needed for wheezing or shortness of breath.   fluticasone (FLONASE) 50 MCG/ACT nasal spray Place 2 sprays into both nostrils daily as  needed for allergies or rhinitis.   furosemide (LASIX) 20 MG tablet Take 1 tablet (20 mg total) by mouth daily.   loratadine (CLARITIN) 10 MG tablet Take 10 mg by mouth daily as needed.   montelukast (SINGULAIR) 10 MG tablet Take 1 tablet (10 mg total) by mouth at bedtime.   Spacer/Aero-Holding Chambers DEVI 1 each by Does not apply route in the morning, at noon, in the evening, and at bedtime.   No current facility-administered medications on file prior to visit.     Allergies:   Penicillins, Codeine, Darvon, and Tall ragweed   Social History   Tobacco Use   Smoking  status: Never   Smokeless tobacco: Never  Vaping Use   Vaping Use: Never used  Substance Use Topics   Alcohol use: No    Comment: Wine once or twice a year   Drug use: No    Family History: family history includes Heart disease in her father; Hypertension in her father and mother; Thyroid disease in her father.  ROS:   Please see the history of present illness. (+) Bilateral LE edema (+) Bilateral elbow pain (+) Left finger edema All other systems are reviewed and negative.     EKGs/Labs/Other Studies Reviewed:    The following studies were reviewed today:  Echo 10/31/2021: Sonographer Comments: Image acquisition challenging due to patient body habitus.  IMPRESSIONS    1. Left ventricular ejection fraction, by estimation, is 60 to 65%. The  left ventricle has normal function. The left ventricle has no regional  wall motion abnormalities. Left ventricular diastolic parameters are  consistent with Grade II diastolic  dysfunction (pseudonormalization). Elevated left atrial pressure.   2. Right ventricular systolic function is normal. The right ventricular  size is normal.   3. Suspect that there is probably moderate eccentric mitral  insufficiency, but this is poorly visualized. The mitral valve is grossly  normal. No evidence of mitral valve regurgitation. No evidence of mitral  stenosis.   4. The aortic valve is normal in structure. Aortic valve regurgitation is  trivial. Mild to moderate aortic valve sclerosis/calcification is present,  without any evidence of aortic stenosis.   5. The inferior vena cava is normal in size with greater than 50%  respiratory variability, suggesting right atrial pressure of 3 mmHg.   Comparison(s): No prior Echocardiogram.   Conclusion(s)/Recommendation(s): If physical findings suggest more than  moderate MR, consider TEE for better evaluation.  LE Venous DVT 08/16/2021: Summary:  RIGHT:  - There is no evidence of deep vein thrombosis  in the lower extremity.     - No cystic structure found in the popliteal fossa.     LEFT:  - No evidence of common femoral vein obstruction.   EKG:  EKG is personally reviewed.   01/23/2022: sinus tachycardia at 114 bpm 12/20/21: not ordered 10/23/2021: NSR at 99 bpm, nonspecific ST pattern  Recent Labs: 08/10/2021: Brain Natriuretic Peptide 21 11/20/2021: ALT 10; Hemoglobin 12.8; Platelets 230; TSH 1.14 01/11/2022: BUN 20; Creatinine, Ser 0.98; Potassium 4.0; Sodium 150   Recent Lipid Panel    Component Value Date/Time   CHOL 210 (H) 11/20/2021 0949   TRIG 49 11/20/2021 0949   HDL 71 11/20/2021 0949   CHOLHDL 3.0 11/20/2021 0949   VLDL 11 01/14/2017 1136   LDLCALC 124 (H) 11/20/2021 0949    Physical Exam:    VS:  BP (!) 146/62 (BP Location: Left Arm, Patient Position: Sitting, Cuff Size: Large)    Pulse (!) 114  Ht _0  (1.6 m)    Wt 196 lb (88.9 kg)    BMI 34.72 kg/m     Wt Readings from Last 3 Encounters:  01/23/22 196 lb (88.9 kg)  12/20/21 186 lb 3.2 oz (84.5 kg)  11/26/21 180 lb (81.6 kg)    GEN: Well nourished, well developed in no acute distress HEENT: Normal, moist mucous membranes NECK: No JVD CARDIAC: regular rhythm, normal S1 and S2, no rubs or gallops. No murmur. VASCULAR: Radial and DP pulses 2+ bilaterally. No carotid bruits RESPIRATORY:  Clear to auscultation without rales, wheezing or rhonchi  ABDOMEN: Soft, non-tender, non-distended MUSCULOSKELETAL:  Ambulates independently SKIN: Warm and dry, 1+ bilateral LE edema. Mild edema in 3rd and 4th fingers of left hand and dorsal aspect of hand. NEUROLOGIC:  Alert and oriented x 3. No focal neuro deficits noted. PSYCHIATRIC:  Normal affect    ASSESSMENT:    1. Primary hypertension   2. Bilateral leg edema   3. Sinus tachycardia   4. Medication management   5. Counseling on health promotion and disease prevention   6. At increased risk for cardiovascular disease      PLAN:    Bilateral LE  edema: -improved on lasix, had compression stockings on today, but bilateral 1+ ankle edema -echo without high risk findings  Hypertension -on amlodipine 5 mg, furosemide 20 mg today -started on carvedilol, but did not tolerate. HR in the 50s based on home readings, even when taking once a day -BP coming down on 40 mg valsartan daily -will increase to 80 mg valsartan daily. Will stop amlodipine to see if LE edema improves. If BP rises, may need to add back amlodipine -prior BMET had hypernatremia and mild AKI. She reports being dehydrated that day. Recheck ordered.  Hypercholesterolemia -was on rosuvastatin 5 mg 3x/week previously, reported not taking at her visit with Dr. Renold Genta 12/18/21. Reassess once blood pressure regimen is stable. -she has a very elevated ASCVD risk score (48.5% 10 year risk today)  Sinus tachycardia, history of -borderline, does not require treatment  Cardiac risk counseling and prevention recommendations: -recommend heart healthy/Mediterranean diet, with whole grains, fruits, vegetable, fish, lean meats, nuts, and olive oil. Limit salt. -recommend moderate walking, 3-5 times/week for 30-50 minutes each session. Aim for at least 150 minutes.week. Goal should be pace of 3 miles/hours, or walking 1.5 miles in 30 minutes -recommend avoidance of tobacco products. Avoid excess alcohol. -ASCVD risk score: The 10-year ASCVD risk score (Arnett DK, et al., 2019) is: 48.5%   Values used to calculate the score:     Age: 76 years     Sex: Female     Is Non-Hispanic African American: Yes     Diabetic: Yes     Tobacco smoker: No     Systolic Blood Pressure: 161 mmHg     Is BP treated: Yes     HDL Cholesterol: 71 mg/dL     Total Cholesterol: 210 mg/dL    Plan for follow up: 3 months or sooner as needed.  Buford Dresser, MD, PhD, [facility]    Medication Adjustments/Labs and Tests Ordered: Current medicines are reviewed at length with the  patient today.  Concerns regarding medicines are outlined above.   Orders Placed This Encounter  Procedures   EKG 12-Lead   Meds ordered this encounter  Medications   valsartan (DIOVAN) 80 MG tablet    Sig: Take 1 tablet (80 mg total) by mouth daily.  Dispense:  90 tablet    Refill:  3    Please wait to fill until patient calls.   Patient Instructions  Medication Instructions:  We are going to increase your valsartan from 40 mg to 80 mg daily. You can take two of the 40 mg tabs until you run out, and then I've sent a new 80 mg prescription into [facility].  Let's stop the amlodipine with the higher dose of valsartan. If your blood pressures go up a lot we will add back, but hopefully we won't need to.  Balance hydration with the fluid pill. Aim for about 1.5 L a day. Leg swelling may or may not get better with stopping amlodipine.  We would like blood pressure to be less than 130/80, but it is much better than it had been. If you noticed it is more consistently back over 160 on the top, let me know.  *If you need a refill on your cardiac medications before your next appointment, please call your pharmacy*   Lab Work: Your provider has recommended lab work in 2 weeks (BMP). Please have this collected at [facility] at [place]. The lab is open 8:00 am - 4:30 pm. Please avoid 12:00p - 1:00p for lunch hour. You do not need an appointment. Please go to [address] [place], [place] [address]. This is in the Primary Care office on the 3rd floor, let them know you are there for blood work and they will direct you to the lab.  If you have labs (blood work) drawn today and your tests are completely normal, you will receive your results only by: [place] (if you have MyChart) OR A paper copy in the mail If you have any lab test that is abnormal or we need to change your treatment, we will call you to review the results.   Testing/Procedures: None ordered  today   Follow-Up: At [facility], you and your health needs are our priority.  As part of our continuing mission to provide you with exceptional heart care, we have created designated Provider Care Teams.  These Care Teams include your primary Cardiologist (physician) and Advanced Practice Providers (APPs -  Physician Assistants and Nurse Practitioners) who all work together to provide you with the care you need, when you need it.  We recommend signing up for the patient portal called "MyChart".  Sign up information is provided on this After Visit Summary.  MyChart is used to connect with patients for Virtual Visits (Telemedicine).  Patients are able to view lab/test results, encounter notes, upcoming appointments, etc.  Non-urgent messages can be sent to your provider as well.   To learn more about what you can do with MyChart, go to [email or web address].    Your next appointment:   3 month(s)  The format for your next appointment:   In Person  Provider:   Buford Dresser, MD    [facility],acting as a scribe for Buford Dresser, MD.,have documented all relevant documentation on the behalf of Buford Dresser, MD,as directed by  Buford Dresser, MD while in the presence of Buford Dresser, MD.  I, Buford Dresser, MD, have reviewed all documentation for this visit. The documentation on 01/23/22 for the exam, diagnosis, procedures, and orders are all accurate and complete.   Signed, Buford Dresser, MD PhD 01/23/2022 5:45 PM    [place]

## 2022-01-23 NOTE — Patient Instructions (Addendum)
Medication Instructions:  We are going to increase your valsartan from 40 mg to 80 mg daily. You can take two of the 40 mg tabs until you run out, and then I've sent a new 80 mg prescription into [facility].  Let's stop the amlodipine with the higher dose of valsartan. If your blood pressures go up a lot we will add back, but hopefully we won't need to.  Balance hydration with the fluid pill. Aim for about 1.5 L a day. Leg swelling may or may not get better with stopping amlodipine.  We would like blood pressure to be less than 130/80, but it is much better than it had been. If you noticed it is more consistently back over 160 on the top, let me know.  *If you need a refill on your cardiac medications before your next appointment, please call your pharmacy*   Lab Work: Your provider has recommended lab work in 2 weeks (BMP). Please have this collected at [facility] at [place]. The lab is open 8:00 am - 4:30 pm. Please avoid 12:00p - 1:00p for lunch hour. You do not need an appointment. Please go to [address] [place], [place] [address]. This is in the Primary Care office on the 3rd floor, let them know you are there for blood work and they will direct you to the lab.  If you have labs (blood work) drawn today and your tests are completely normal, you will receive your results only by: [place] (if you have MyChart) OR A paper copy in the mail If you have any lab test that is abnormal or we need to change your treatment, we will call you to review the results.   Testing/Procedures: None ordered today   Follow-Up: At [facility], you and your health needs are our priority.  As part of our continuing mission to provide you with exceptional heart care, we have created designated Provider Care Teams.  These Care Teams include your primary Cardiologist (physician) and Advanced Practice Providers (APPs -  Physician Assistants and Nurse Practitioners) who all  work together to provide you with the care you need, when you need it.  We recommend signing up for the patient portal called "MyChart".  Sign up information is provided on this After Visit Summary.  MyChart is used to connect with patients for Virtual Visits (Telemedicine).  Patients are able to view lab/test results, encounter notes, upcoming appointments, etc.  Non-urgent messages can be sent to your provider as well.   To learn more about what you can do with MyChart, go to [email or web address].    Your next appointment:   3 month(s)  The format for your next appointment:   In Person  Provider:   Buford Dresser, MD

## 2022-01-29 ENCOUNTER — Other Ambulatory Visit: Payer: Self-pay

## 2022-01-29 ENCOUNTER — Ambulatory Visit (INDEPENDENT_AMBULATORY_CARE_PROVIDER_SITE_OTHER): Payer: Medicare Other | Admitting: Podiatry

## 2022-01-29 DIAGNOSIS — B351 Tinea unguium: Secondary | ICD-10-CM | POA: Diagnosis not present

## 2022-01-29 DIAGNOSIS — M79674 Pain in right toe(s): Secondary | ICD-10-CM | POA: Diagnosis not present

## 2022-01-29 DIAGNOSIS — L600 Ingrowing nail: Secondary | ICD-10-CM | POA: Diagnosis not present

## 2022-01-29 DIAGNOSIS — M79675 Pain in left toe(s): Secondary | ICD-10-CM

## 2022-01-29 NOTE — Progress Notes (Signed)
°  Subjective:  [name], female    DOB: [birth year],  MRN: [id number]  Chief Complaint  Patient presents with   Routine foot care    Nail trim 1-5 bilat    81 y.o. female presents for f/u nails are thickened and painful today and she cannot cut them herself. Objective:  Physical Exam: warm, good capillary refill, nail exam onychomycosis of the toenails with pain to palpation, no trophic changes or ulcerative lesions, normal DP and PT pulses, and normal sensory exam. Pitting edema right leg No images are attached to the encounter.  Assessment:   1. Ingrown nail   2. Pain due to onychomycosis of toenails of both feet     Plan:  Patient was evaluated and treated and all questions answered.  Onychomycosis  -Again debrided thickened and ingrown nails to patient relief. F/u PRN.  No follow-ups on file.

## 2022-02-05 ENCOUNTER — Ambulatory Visit: Payer: Medicare Other | Admitting: Podiatry

## 2022-02-06 DIAGNOSIS — Z96652 Presence of left artificial knee joint: Secondary | ICD-10-CM | POA: Diagnosis not present

## 2022-02-06 DIAGNOSIS — M25552 Pain in left hip: Secondary | ICD-10-CM | POA: Insufficient documentation

## 2022-02-12 ENCOUNTER — Telehealth: Payer: Self-pay | Admitting: Cardiology

## 2022-02-12 NOTE — Telephone Encounter (Signed)
Pt c/o BP issue: STAT if pt c/o blurred vision, one-sided weakness or slurred speech  1. What are your last 5 BP readings? 2/13 1:30pm 161/82 HR 66 hadn't taken BP medication 2/13 9:30pm  151/72 HR 76 took BP medication 2/14 12:20pm 189/99 HR 55 hadn't taken  BP medication (left arm) 2/14 12:22pm 187/103 HR 70 hadn't taken BP medication (left arm) 2/14 12:24pm 170/95 HR 89 hadn't taken BP medication (right arm)  2. Are you having any other symptoms (ex. Dizziness, headache, blurred vision, passed out)? no  3. What is your BP issue? Elevated BP.

## 2022-02-12 NOTE — Telephone Encounter (Signed)
Returning call to patient about elevated blood pressures. Spoke with Dr. Harrell Gave who advised that patient take BP meds and then check pressure 1-2 hours after that. Can do this for a week to see if blood pressure is well controlled on her current medications and call us back!

## 2022-02-14 ENCOUNTER — Other Ambulatory Visit: Payer: Self-pay | Admitting: Internal Medicine

## 2022-02-14 ENCOUNTER — Telehealth (HOSPITAL_BASED_OUTPATIENT_CLINIC_OR_DEPARTMENT_OTHER): Payer: Self-pay | Admitting: Cardiology

## 2022-02-14 NOTE — Telephone Encounter (Signed)
Called patient to answer question about dosing. Patient states she was previosuly on Valsartan 20mg  and was told to double and take 40mg , but prescription and office visit note say to double from 40 to 80mg !    "Valsartan changed to 80mg  at most recent office visit. Please call patient to inform her to take 80mg  as prescribed.    Loel Dubonnet, NP "    Please advise on dosing!

## 2022-02-14 NOTE — Telephone Encounter (Signed)
New Message:    Patients wants to  know what milligram of Valsartan she is supposed to be taking. She thought Dr Harrell Gave said 40 mg, when she called the pharmacist they said 80 mg.

## 2022-02-14 NOTE — Telephone Encounter (Signed)
Valsartan changed to 80mg  at most recent office visit. Please call patient to inform her to take 80mg  as prescribed.   Lisa Dubonnet, NP

## 2022-02-15 ENCOUNTER — Telehealth: Payer: Self-pay | Admitting: Cardiology

## 2022-02-15 NOTE — Telephone Encounter (Signed)
Patient is calling to get clarification on her medication she states what she has waiting at the pharmacy for her is valsartan (DIOVAN) 80 MG tablet. She states she was taking already 80mg .  As what she had at home was 40mg  of valsartan.  She was taking 40mg  2x a day which equaled 80mg .  She thought what she had at home was 20mg .  She wanted to let Dr. Harrell Gave know she was on 80mg  already.  He also wants to report her BP: 160/88 160/96  Her BP has gone back up, valsartan at first it lowered it, but then it went back up.

## 2022-02-15 NOTE — Telephone Encounter (Signed)
Patient returned call to state that she was previously tasking 40mg  twice daily. She should now be taking 80mg  twice daily!    Patient will continue to take her bp 1-2 hours after dosing

## 2022-02-19 DIAGNOSIS — J3089 Other allergic rhinitis: Secondary | ICD-10-CM | POA: Diagnosis not present

## 2022-02-19 DIAGNOSIS — J3081 Allergic rhinitis due to animal (cat) (dog) hair and dander: Secondary | ICD-10-CM | POA: Diagnosis not present

## 2022-02-19 DIAGNOSIS — J45991 Cough variant asthma: Secondary | ICD-10-CM | POA: Diagnosis not present

## 2022-02-19 DIAGNOSIS — J301 Allergic rhinitis due to pollen: Secondary | ICD-10-CM | POA: Diagnosis not present

## 2022-02-25 ENCOUNTER — Telehealth (HOSPITAL_BASED_OUTPATIENT_CLINIC_OR_DEPARTMENT_OTHER): Payer: Self-pay | Admitting: Family

## 2022-02-25 DIAGNOSIS — Z79899 Other long term (current) drug therapy: Secondary | ICD-10-CM

## 2022-02-25 NOTE — Telephone Encounter (Signed)
RN called with NP recommendations, patient and her sister endorse understanding. Office number given to patient!    "Please contact patient with the following recommendations: Per previous recommendations -  aim for fluid intake of 1.5L per day, elevate legs, and wear compression stockings daily.  It appears PCP re-prescribed Amlodipine on 02/14/22 - please ensure patient not taking. If patient notes lower extremity edema, increase Lasix to 40mg  x 3 days then return to 20mg  daily. Instruct patient to present for repeat labs within next 7-10 days. BMP previously ordered but likely needs released. Follow up as scheduled with Dr. Harrell Gave. (May be beneficial to discuss [place] at this visit but needs to be an in-person visit per insurance)"

## 2022-02-25 NOTE — Telephone Encounter (Signed)
The following message was received from patients sister, Lisa [name], regarding the patient through her MyChart. Education provided that messages regarding other patients may not be sent via personal MyChart.  "Good morning Dr. Harrell Gave, it's your patient Lisa [name] (08/15/1944), writing on behalf of my sister, Lisa [name] ([birth year]). [name] called your office Friday afternoon, February 22, 2022, and left a message for your nurse to return her call at [phone].  She did not receive a reply. Her call focused on the continued swelling of both legs. Her ankle circumference is: left leg:11 inches, right leg: 13 inches. At her last office visit, you removed Amlodipine from her med list but, the swelling has not decreased. Her legs appear to be solidly swollen from the knees down. The over the counter compression socks don't appear to provide relief. Lisa [name] appears to be in a state of mind whereby she is not taking ownership for her care. In other words, if I don't encourage/remind her to take her meds, then she may or may not do what is required to restore her health. She will tell you that she is going to walk, but doesn't.  She stays in one room, sits eats, and watches television.  Her weight at 193lbs is the heaviest that she has ever been and is not a good place to be considering her other health issues. Also, she has not chosen to return for her labs. As a sibling, I am so worried about her current health status. Please recommend  what I need to do to navigate this stressful journey. Thank you for your time and assistance.  [place] Lisa [name]" ________________________________________  Review of chart: Echocardiogram 10/2021 with normal heart pumping function and grade 2 diastolic dysfunction. When seen 01/23/22 Valsartan increased and Amlodipine 5mg  discontinued.Our last documented phone call is from 02/15/22 and patient was educated to take Valsartan 80mg  BID as she had questions  regarding dosing.    Please contact patient with the following recommendations: Per previous recommendations -  aim for fluid intake of 1.5L per day, elevate legs, and wear compression stockings daily.  It appears PCP re-prescribed Amlodipine on 02/14/22 - please ensure patient not taking. If patient notes lower extremity edema, increase Lasix to 40mg  x 3 days then return to 20mg  daily. Instruct patient to present for repeat labs within next 7-10 days. BMP previously ordered but likely needs released. Follow up as scheduled with Dr. Harrell Gave. (May be beneficial to discuss [facility] at this visit but needs to be an in-person visit per insurance)

## 2022-02-25 NOTE — Addendum Note (Signed)
Addended by: Gerald Stabs on: 02/25/2022 01:25 PM   Modules accepted: Orders

## 2022-02-25 NOTE — Telephone Encounter (Signed)
Patient is following up. She would like to know where she can go to buy compression stocking and whether a prescription will be needed. Please advise.

## 2022-03-05 ENCOUNTER — Telehealth: Payer: Self-pay | Admitting: Cardiology

## 2022-03-05 NOTE — Telephone Encounter (Signed)
Patient has concerns that her blood pressure is still going up and down and she still has minor ankle swelling, requested sooner appointment, placed in first available slot on 3/31  ? ? ?"I unfortunately cannot determine the cause based on this--typically a blood clot would cause shortness of breath at rest as well as with activity. If she is concerned about a blood clot, then an urgent care or ER could check a blood test called a d-dimer and do either a CT or ultrasound if it is abnormal. D-dimer is a time sensitive test and best drawn in a lab where they can run it urgently (not in an office where it can sit for a while before it is sent).  ?It would make sense if the left thigh pain is arthritis, but I would defer to her ortho provider on this." ?

## 2022-03-05 NOTE — Telephone Encounter (Signed)
RN returned call to patient!  ? ? ?Patient has been experiencing Shortness of breath since Sunday (give or take a few days per pt.) patient previously encouraged to start exercising. Patient has started to move more and is experiencing shortness of breath. ? ?Patient does endorse a cough ( not constant), her sister expressing concern that this might be blood clots in the lung. Patient is having anxiety and using rescue inhaler. Only occurring when she is up with movement.  ? ? ?Patient also complains about pain in her left thigh. States when she has to move the leg like to get into bed it hurts. Occurring 2-3 weeks, same leg as her knee replacement. Was seen by PA who did the knee replacement. Found arthritis in the hip, they wanted to send her for an infusion but patient declined- advised patient to follow up with that office if the pain is still occurring. Patients sister again believes it is a blood clot.  ? ? ?Routing to [employer] and [employer] for her as well!  ?

## 2022-03-05 NOTE — Telephone Encounter (Signed)
I unfortunately cannot determine the cause based on this--typically a blood clot would cause shortness of breath at rest as well as with activity. If she is concerned about a blood clot, then an urgent care or ER could check a blood test called a d-dimer and do either a CT or ultrasound if it is abnormal. D-dimer is a time sensitive test and best drawn in a lab where they can run it urgently (not in an office where it can sit for a while before it is sent). It would make sense if the left thigh pain is arthritis, but I would defer to her ortho provider on this.

## 2022-03-05 NOTE — Telephone Encounter (Signed)
Pt c/o Shortness Of Breath: STAT if SOB developed within the last 24 hours or pt is noticeably SOB on the phone ? ?1. Are you currently SOB (can you hear that pt is SOB on the phone)? 3/5 (Sunday) ? ?2. How long have you been experiencing SOB? 2 weeks ? ?3. Are you SOB when sitting or when up moving around? Moving around ? ?4. Are you currently experiencing any other symptoms? Pain in left thigh  ?

## 2022-03-06 ENCOUNTER — Ambulatory Visit: Payer: TRICARE For Life (TFL) | Admitting: Podiatry

## 2022-03-07 ENCOUNTER — Encounter (HOSPITAL_BASED_OUTPATIENT_CLINIC_OR_DEPARTMENT_OTHER): Payer: Self-pay

## 2022-03-07 DIAGNOSIS — M25552 Pain in left hip: Secondary | ICD-10-CM | POA: Diagnosis not present

## 2022-03-08 ENCOUNTER — Ambulatory Visit: Payer: TRICARE For Life (TFL) | Admitting: Podiatry

## 2022-03-08 NOTE — Telephone Encounter (Signed)
Please advise 

## 2022-03-09 DIAGNOSIS — M25552 Pain in left hip: Secondary | ICD-10-CM | POA: Diagnosis not present

## 2022-03-11 ENCOUNTER — Encounter (HOSPITAL_BASED_OUTPATIENT_CLINIC_OR_DEPARTMENT_OTHER): Payer: Self-pay

## 2022-03-12 ENCOUNTER — Telehealth: Payer: Self-pay | Admitting: Cardiology

## 2022-03-12 NOTE — Telephone Encounter (Signed)
Patient's sister Rise Paganini called stating her sister received a call from [place] today.  ?

## 2022-03-12 NOTE — Telephone Encounter (Signed)
F/u   Sister Rise Paganini at the front desk would like Dr. Harrell Gave only to respond to the message from yesterday.  Sister Lynwood Dawley retention fluids / large amount in both legs from the knees down.   Upcoming appt on  3.29.23 @ 3:20 pm.

## 2022-03-13 NOTE — Telephone Encounter (Signed)
Please see updated encounter  

## 2022-03-13 NOTE — Telephone Encounter (Signed)
Patient's sister presented at the office this morning.  She verbalized concerns about the patient's increasing edema and episodic shortness of breath.  She states she had called previously to report patient's complaints of thigh pain and edema.  Pt saw Dr Maureen Ralphs for thigh discomfort and received treatment.  During follow up visit Dr. Maureen Ralphs verbalized concerns about patient's leg edema and suggested pt. Consult her cardiologist.  Pt's sister verbalized concerns about what she "perceived" as a lack of response to her calls from our office, and she said she received a call from the office yesterday and when she returned the call she was told no one from the office called her today.  This RN spoke with Ms Ryant sister during her visit to the office.  We discussed watching fluid intake, limiting salt and elevating edematous extremities. Patients sister verbalized concern about facial edema this AM.  We offered an appt to see Dr. Harrell Gave this Friday at 11:00 am which was accepted.  We reviewed symptoms to be alert to, and consider emergent.  Patient's sister verbalized understanding and stated she would know the symptoms or changes which would require a trip to the ED or call to the office before her appt. On Friday.  Georgana Curio MHA RN CCM ?

## 2022-03-13 NOTE — Telephone Encounter (Signed)
Pt has an appointment scheduled for 3/17 to assess further.  ?

## 2022-03-14 NOTE — Progress Notes (Signed)
?Cardiology Office Note:   ? ?Date:  03/15/2022  ? ?ID:  Lisa [name], DOB [birth year], MRN [id number] ? ?PCP:  Elby Showers, MD  ?Cardiologist:  Buford Dresser, MD ? ?Referring MD: Elby Showers, MD  ? ?CC: follow up ? ?History of Present Illness:   ? ?Lisa [name] is a 81 y.o. female with a hx of asthma, hyperlipidemia, hypertension, obesity, arthritis, cervical cancer, and goiter, who is seen for follow up today. I initially met her 10/23/21 as a new consult at the request of Baxley, Cresenciano Lick, MD for the evaluation and management of tachycardia. ? ?Cardiovascular risk factors: ?Prior clinical ASCVD: None ?Comorbid conditions: hypertension, hyperlipidemia ?Metabolic syndrome/Obesity:  Obesity, ?Chronic inflammatory conditions: none ?Tobacco use history: Not a smoker (2-3 months in college) ?Alcohol consumption: Every now and then ?Family history: mother and father had hypertension, father had heart disease ? ?Today: ?She is accompanied by her sister. Overall, she states she is feeling a little anxiety lately. She has been to see her allergist, but continues to suffer from seasonal allergies. ? ?One of her main concerns today is worsening bilateral LE edema. She is taking 20 mg Lasix once daily in the AM, and wearing compression socks. Also, her swelling improves a little bit with elevation. However, she can only raise her LLE a limited height due to her pain. ? ?Last week she developed discomfort in her left thigh. She followed up with orthopedics and has been scheduled for an MRI tomorrow. She also has received a steriodal injection which she believes has helped. Her thigh pain has also prevents her from being able to walk long distances or formally exercise. Previously she would use a stationary bike routinely. She is also living in her sister's apartment, and would walk the long hallways for exercise if she wasn't limited by her pain. ? ?At home she has not noticed any low blood pressures.  Lately her diastolic blood pressure seems to be better controlled than systolic. In January 2023 she was first prescribed Valsartan and they initially saw blood pressures in the 130s. Her blood pressure has not been as low as the 130s since that time. ? ?Lately her shortness of breath has not been a "continuing" issue. She has experienced this, but not nearly as severe as prior. This is typically a brief duration, has occurred maybe 2-3 times since her last visit. Her sister reports hearing a mostly non-productive cough as well. In the last couple of weeks, if she does produce sputum it will be clear. ? ?She denies any palpitations, or chest pain. No lightheadedness, headaches, syncope, orthopnea, or PND. ? ? ?Past Medical History:  ?Diagnosis Date  ? Allergy   ? Arthritis   ? Cancer [facility])   ? cervical  ? Complication of anesthesia   ? Difficulty waking up after hysterectomy in 1973  ? Goiter   ? Headache   ? Sinus headaches  ? Hyperlipidemia   ? Hypertension   ? Obesity   ? Osteopenia   ? Vitamin D deficiency   ? ? ?Past Surgical History:  ?Procedure Laterality Date  ? ABDOMINAL HYSTERECTOMY    ? EYE SURGERY    ? HAMMER TOE SURGERY  12/02  ? right  ? TOTAL KNEE ARTHROPLASTY Left 08/21/2020  ? Procedure: TOTAL KNEE ARTHROPLASTY;  Surgeon: Gaynelle Arabian, MD;  Location: [facility];  Service: Orthopedics;  Laterality: Left;  60mn  ? WISDOM TOOTH EXTRACTION    ? ? ?Current Medications: ?Current Outpatient Medications  on File Prior to Visit  ?Medication Sig  ? acetaminophen (TYLENOL) 500 MG tablet Take 500-1,000 mg by mouth every 6 (six) hours as needed for mild pain or moderate pain.  ? albuterol (VENTOLIN HFA) 108 (90 Base) MCG/ACT inhaler Inhale 2 puffs into the lungs every 6 (six) hours as needed for wheezing or shortness of breath.  ? fluticasone (FLONASE) 50 MCG/ACT nasal spray Place 2 sprays into both nostrils daily as needed for allergies or rhinitis.  ? furosemide (LASIX) 20 MG tablet Take 1 tablet (20 mg total) by  mouth daily.  ? loratadine (CLARITIN) 10 MG tablet Take 10 mg by mouth daily as needed.  ? Spacer/Aero-Holding Chambers DEVI 1 each by Does not apply route in the morning, at noon, in the evening, and at bedtime.  ? valsartan (DIOVAN) 80 MG tablet Take 1 tablet (80 mg total) by mouth daily.  ? montelukast (SINGULAIR) 10 MG tablet Take 1 tablet (10 mg total) by mouth at bedtime. (Patient not taking: Reported on 03/15/2022)  ? ?No current facility-administered medications on file prior to visit.  ?  ? ?Allergies:   Penicillins, Codeine, Darvon, and Tall ragweed  ? ?Social History  ? ?Tobacco Use  ? Smoking status: Never  ? Smokeless tobacco: Never  ?Vaping Use  ? Vaping Use: Never used  ?Substance Use Topics  ? Alcohol use: No  ?  Comment: Wine once or twice a year  ? Drug use: No  ? ? ?Family History: ?family history includes Heart disease in her father; Hypertension in her father and mother; Thyroid disease in her father. ? ?ROS:   ?Please see the history of present illness. ?(+) Bilateral LE edema ?(+) Left thigh pain ?(+) Shortness of breath ?(+) Cough ?(+) Seasonal allergies ?All other systems are reviewed and negative.   ? ? ?EKGs/Labs/Other Studies Reviewed:   ? ?The following studies were reviewed today: ? ?Echo 10/31/2021: ?Sonographer Comments: Image acquisition challenging due to patient body habitus.  ?IMPRESSIONS  ? ? 1. Left ventricular ejection fraction, by estimation, is 60 to 65%. The  ?left ventricle has normal function. The left ventricle has no regional  ?wall motion abnormalities. Left ventricular diastolic parameters are  ?consistent with Grade II diastolic  ?dysfunction (pseudonormalization). Elevated left atrial pressure.  ? 2. Right ventricular systolic function is normal. The right ventricular  ?size is normal.  ? 3. Suspect that there is probably moderate eccentric mitral  ?insufficiency, but this is poorly visualized. The mitral valve is grossly  ?normal. No evidence of mitral valve  regurgitation. No evidence of mitral  ?stenosis.  ? 4. The aortic valve is normal in structure. Aortic valve regurgitation is  ?trivial. Mild to moderate aortic valve sclerosis/calcification is present,  ?without any evidence of aortic stenosis.  ? 5. The inferior vena cava is normal in size with greater than 50%  ?respiratory variability, suggesting right atrial pressure of 3 mmHg.  ? ?Comparison(s): No prior Echocardiogram.  ? ?Conclusion(s)/Recommendation(s): If physical findings suggest more than  ?moderate MR, consider TEE for better evaluation. ? ?LE Venous DVT 08/16/2021: ?Summary:  ?RIGHT:  ?- There is no evidence of deep vein thrombosis in the lower extremity.  ?   ?- No cystic structure found in the popliteal fossa.  ?   ?LEFT:  ?- No evidence of common femoral vein obstruction.  ? ?EKG:  EKG is personally reviewed.   ?03/15/2022: EKG was not ordered. ?01/23/2022: sinus tachycardia at 114 bpm ?12/20/21: not ordered ?10/23/2021: NSR at 99 bpm,  nonspecific ST pattern ? ?Recent Labs: ?08/10/2021: Brain Natriuretic Peptide 21 ?11/20/2021: ALT 10; Hemoglobin 12.8; Platelets 230; TSH 1.14 ?01/11/2022: BUN 20; Creatinine, Ser 0.98; Potassium 4.0; Sodium 150  ? ?Recent Lipid Panel ?   ?Component Value Date/Time  ? CHOL 210 (H) 11/20/2021 0949  ? TRIG 49 11/20/2021 0949  ? HDL 71 11/20/2021 0949  ? CHOLHDL 3.0 11/20/2021 0949  ? VLDL 11 01/14/2017 1136  ? [place] 124 (H) 11/20/2021 0949  ? ? ?Physical Exam:   ? ?VS:  BP (!) 182/78   Pulse 97   Ht 5' 3"  (1.6 m)   Wt 196 lb (88.9 kg)   SpO2 91%   BMI 34.72 kg/m?    ? ?Wt Readings from Last 3 Encounters:  ?03/15/22 196 lb (88.9 kg)  ?01/23/22 196 lb (88.9 kg)  ?12/20/21 186 lb 3.2 oz (84.5 kg)  ?  ?GEN: Well nourished, well developed in no acute distress; In wheelchair. ?HEENT: Normal, moist mucous membranes ?NECK: No JVD ?CARDIAC: regular rhythm, normal S1 and S2, no rubs or gallops. No murmur. ?VASCULAR: Radial and DP pulses 2+ bilaterally. No carotid  bruits ?RESPIRATORY:  Clear to auscultation without rales, wheezing or rhonchi  ?ABDOMEN: Soft, non-tender, non-distended ?MUSCULOSKELETAL:  Ambulates independently ?SKIN: Warm and dry, 1+ bilateral LE edema.  ?NEUROLOGIC:  Alert an

## 2022-03-15 ENCOUNTER — Ambulatory Visit (INDEPENDENT_AMBULATORY_CARE_PROVIDER_SITE_OTHER): Payer: Medicare Other | Admitting: Cardiology

## 2022-03-15 ENCOUNTER — Encounter (HOSPITAL_BASED_OUTPATIENT_CLINIC_OR_DEPARTMENT_OTHER): Payer: Self-pay

## 2022-03-15 ENCOUNTER — Other Ambulatory Visit: Payer: Self-pay

## 2022-03-15 ENCOUNTER — Encounter (HOSPITAL_BASED_OUTPATIENT_CLINIC_OR_DEPARTMENT_OTHER): Payer: Self-pay | Admitting: Cardiology

## 2022-03-15 VITALS — BP 182/78 | HR 97 | Ht 63.0 in | Wt 196.0 lb

## 2022-03-15 DIAGNOSIS — Z7189 Other specified counseling: Secondary | ICD-10-CM

## 2022-03-15 DIAGNOSIS — R600 Localized edema: Secondary | ICD-10-CM | POA: Diagnosis not present

## 2022-03-15 DIAGNOSIS — I10 Essential (primary) hypertension: Secondary | ICD-10-CM

## 2022-03-15 MED ORDER — FUROSEMIDE 20 MG PO TABS: 20.0000 mg | ORAL_TABLET | Freq: Two times a day (BID) | ORAL | 3 refills | Status: DC

## 2022-03-15 NOTE — Patient Instructions (Signed)
Medication Instructions:  ?INCREASE YOUR FUROSEMIDE TO TWICE A DAY  ? ?*If you need a refill on your cardiac medications before your next appointment, please call your pharmacy* ? ?Lab Work: ?NONE ? ?Testing/Procedures: ?NONE ? ?Follow-Up: ?At [facility], you and your health needs are our priority.  As part of our continuing mission to provide you with exceptional heart care, we have created designated Provider Care Teams.  These Care Teams include your primary Cardiologist (physician) and Advanced Practice Providers (APPs -  Physician Assistants and Nurse Practitioners) who all work together to provide you with the care you need, when you need it. ? ?We recommend signing up for the patient portal called "MyChart".  Sign up information is provided on this After Visit Summary.  MyChart is used to connect with patients for Virtual Visits (Telemedicine).  Patients are able to view lab/test results, encounter notes, upcoming appointments, etc.  Non-urgent messages can be sent to your provider as well.   ?To learn more about what you can do with MyChart, go to [email or web address].   ? ?Your next appointment:   ?04/03/2022 9:00 AM WITH DR CHRISTOPHER ? ?

## 2022-03-16 DIAGNOSIS — M25552 Pain in left hip: Secondary | ICD-10-CM | POA: Diagnosis not present

## 2022-03-18 NOTE — Telephone Encounter (Signed)
Please advise 

## 2022-03-19 ENCOUNTER — Ambulatory Visit (HOSPITAL_BASED_OUTPATIENT_CLINIC_OR_DEPARTMENT_OTHER): Payer: Medicare Other | Admitting: Cardiology

## 2022-03-27 ENCOUNTER — Ambulatory Visit (HOSPITAL_BASED_OUTPATIENT_CLINIC_OR_DEPARTMENT_OTHER): Payer: Federal, State, Local not specified - PPO | Admitting: Cardiology

## 2022-03-29 ENCOUNTER — Ambulatory Visit (HOSPITAL_BASED_OUTPATIENT_CLINIC_OR_DEPARTMENT_OTHER): Payer: Federal, State, Local not specified - PPO | Admitting: Cardiology

## 2022-04-01 ENCOUNTER — Ambulatory Visit (HOSPITAL_BASED_OUTPATIENT_CLINIC_OR_DEPARTMENT_OTHER): Payer: Federal, State, Local not specified - PPO | Admitting: Cardiology

## 2022-04-03 ENCOUNTER — Encounter (HOSPITAL_BASED_OUTPATIENT_CLINIC_OR_DEPARTMENT_OTHER): Payer: Self-pay | Admitting: Cardiology

## 2022-04-03 ENCOUNTER — Ambulatory Visit (INDEPENDENT_AMBULATORY_CARE_PROVIDER_SITE_OTHER): Payer: Medicare Other | Admitting: Cardiology

## 2022-04-03 VITALS — BP 140/84 | HR 94 | Ht 63.0 in | Wt 183.9 lb

## 2022-04-03 DIAGNOSIS — I5032 Chronic diastolic (congestive) heart failure: Secondary | ICD-10-CM | POA: Diagnosis not present

## 2022-04-03 DIAGNOSIS — R600 Localized edema: Secondary | ICD-10-CM

## 2022-04-03 DIAGNOSIS — I10 Essential (primary) hypertension: Secondary | ICD-10-CM | POA: Diagnosis not present

## 2022-04-03 DIAGNOSIS — Z79899 Other long term (current) drug therapy: Secondary | ICD-10-CM | POA: Diagnosis not present

## 2022-04-03 DIAGNOSIS — Z9189 Other specified personal risk factors, not elsewhere classified: Secondary | ICD-10-CM | POA: Diagnosis not present

## 2022-04-03 MED ORDER — VALSARTAN 80 MG PO TABS: 80.0000 mg | ORAL_TABLET | Freq: Two times a day (BID) | ORAL | 3 refills | Status: DC

## 2022-04-03 MED ORDER — FUROSEMIDE 20 MG PO TABS: 20.0000 mg | ORAL_TABLET | Freq: Two times a day (BID) | ORAL | 3 refills | Status: DC

## 2022-04-03 NOTE — Progress Notes (Signed)
?Cardiology Office Note:   ? ?Date:  04/03/2022  ? ?ID:  Lisa [name], DOB [birth year], MRN [id number] ? ?PCP:  Elby Showers, MD  ?Cardiologist:  Buford Dresser, MD ? ?Referring MD: Elby Showers, MD  ? ?CC: follow up ? ?History of Present Illness:   ? ?Lisa [name] is a 81 y.o. female with a hx of asthma, hyperlipidemia, hypertension, obesity, arthritis, cervical cancer, and goiter, who is seen for follow up today. I initially met her 10/23/21 as a new consult at the request of Baxley, Cresenciano Lick, MD for the evaluation and management of tachycardia. ? ?Cardiovascular risk factors: ?Prior clinical ASCVD: None ?Comorbid conditions: hypertension, hyperlipidemia ?Metabolic syndrome/Obesity:  Obesity, ?Chronic inflammatory conditions: none ?Tobacco use history: Not a smoker (2-3 months in college) ?Alcohol consumption: Every now and then ?Family history: mother and father had hypertension, father had heart disease ? ?Today: ?She is accompanied by her sister. Overall, she is feeling okay. her main concern today is her recent difficulty with lifting her left leg. She had an MRI due to concern for a stress fracture. It was found that she had a torn tendon in her left thigh. She has received a steroidal injection and pain medication. ? ?After starting the increased dose of Lasix at her last visit, she quickly noticed improvement in her swelling. At this time, she continues to produce good urine amounts on Lasix, and her swelling continues to improve. ? ?At home her blood pressures were averaging in the 150s/70s last month. They have not monitored her BP again until today. ? ?Also she complains of seasonal allergies. She has seen a specialist and was started on Flovent. ? ?She has plans to increase her exercise and walk. Generally she does well with staying hydrated. Regarding her diet she does not consume a lot of salt. Her sister usually cooks their meals, and she is conscientious of watching the sodium in  their diet. She enjoys pizza, which they are also making at home. ? ?She denies any palpitations, chest pain, shortness of breath. No lightheadedness, headaches, syncope, orthopnea, or PND. ? ? ?Past Medical History:  ?Diagnosis Date  ? Allergy   ? Arthritis   ? Cancer [facility])   ? cervical  ? Complication of anesthesia   ? Difficulty waking up after hysterectomy in 1973  ? Goiter   ? Headache   ? Sinus headaches  ? Hyperlipidemia   ? Hypertension   ? Obesity   ? Osteopenia   ? Vitamin D deficiency   ? ? ?Past Surgical History:  ?Procedure Laterality Date  ? ABDOMINAL HYSTERECTOMY    ? EYE SURGERY    ? HAMMER TOE SURGERY  12/02  ? right  ? TOTAL KNEE ARTHROPLASTY Left 08/21/2020  ? Procedure: TOTAL KNEE ARTHROPLASTY;  Surgeon: Gaynelle Arabian, MD;  Location: [facility];  Service: Orthopedics;  Laterality: Left;  62mn  ? WISDOM TOOTH EXTRACTION    ? ? ?Current Medications: ?Current Outpatient Medications on File Prior to Visit  ?Medication Sig  ? acetaminophen (TYLENOL) 500 MG tablet Take 500-1,000 mg by mouth every 6 (six) hours as needed for mild pain or moderate pain.  ? albuterol (VENTOLIN HFA) 108 (90 Base) MCG/ACT inhaler Inhale 2 puffs into the lungs every 6 (six) hours as needed for wheezing or shortness of breath.  ? Azelastine HCl 137 MCG/SPRAY SOLN 1 spray ea nostril  ? fexofenadine (ALLEGRA) 180 MG tablet 1 tablet  ? FLOVENT HFA 110 MCG/ACT inhaler Inhale 2 puffs  into the lungs 2 (two) times daily.  ? fluticasone (FLONASE) 50 MCG/ACT nasal spray Place 2 sprays into both nostrils daily as needed for allergies or rhinitis.  ? Spacer/Aero-Holding Chambers DEVI 1 each by Does not apply route in the morning, at noon, in the evening, and at bedtime.  ? triamcinolone ointment (KENALOG) 0.1 % SMARTSIG:sparingly Topical Twice Daily PRN  ? ?No current facility-administered medications on file prior to visit.  ?  ? ?Allergies:   Penicillins, Codeine, Darvon, and Tall ragweed  ? ?Social History  ? ?Tobacco Use  ? Smoking  status: Never  ? Smokeless tobacco: Never  ?Vaping Use  ? Vaping Use: Never used  ?Substance Use Topics  ? Alcohol use: No  ?  Comment: Wine once or twice a year  ? Drug use: No  ? ? ?Family History: ?family history includes Heart disease in her father; Hypertension in her father and mother; Thyroid disease in her father. ? ?ROS:   ?Please see the history of present illness. ?(+) Left LE pain, difficulty with ambulation ?(+) Seasonal allergies ?All other systems are reviewed and negative.   ? ? ?EKGs/Labs/Other Studies Reviewed:   ? ?The following studies were reviewed today: ? ?Echo 10/31/2021: ?Sonographer Comments: Image acquisition challenging due to patient body habitus.  ?IMPRESSIONS  ? ? 1. Left ventricular ejection fraction, by estimation, is 60 to 65%. The  ?left ventricle has normal function. The left ventricle has no regional  ?wall motion abnormalities. Left ventricular diastolic parameters are  ?consistent with Grade II diastolic  ?dysfunction (pseudonormalization). Elevated left atrial pressure.  ? 2. Right ventricular systolic function is normal. The right ventricular  ?size is normal.  ? 3. Suspect that there is probably moderate eccentric mitral  ?insufficiency, but this is poorly visualized. The mitral valve is grossly  ?normal. No evidence of mitral valve regurgitation. No evidence of mitral  ?stenosis.  ? 4. The aortic valve is normal in structure. Aortic valve regurgitation is  ?trivial. Mild to moderate aortic valve sclerosis/calcification is present,  ?without any evidence of aortic stenosis.  ? 5. The inferior vena cava is normal in size with greater than 50%  ?respiratory variability, suggesting right atrial pressure of 3 mmHg.  ? ?Comparison(s): No prior Echocardiogram.  ? ?Conclusion(s)/Recommendation(s): If physical findings suggest more than  ?moderate MR, consider TEE for better evaluation. ? ?LE Venous DVT 08/16/2021: ?Summary:  ?RIGHT:  ?- There is no evidence of deep vein thrombosis in  the lower extremity.  ?   ?- No cystic structure found in the popliteal fossa.  ?   ?LEFT:  ?- No evidence of common femoral vein obstruction.  ? ?EKG:  EKG is personally reviewed.   ?04/03/2022: EKG was not ordered. ?03/15/2022: EKG was not ordered. ?01/23/2022: sinus tachycardia at 114 bpm ?12/20/21: not ordered ?10/23/2021: NSR at 99 bpm, nonspecific ST pattern ? ?Recent Labs: ?08/10/2021: Brain Natriuretic Peptide 21 ?11/20/2021: ALT 10; Hemoglobin 12.8; Platelets 230; TSH 1.14 ?01/11/2022: BUN 20; Creatinine, Ser 0.98; Potassium 4.0; Sodium 150  ? ?Recent Lipid Panel ?   ?Component Value Date/Time  ? CHOL 210 (H) 11/20/2021 0949  ? TRIG 49 11/20/2021 0949  ? HDL 71 11/20/2021 0949  ? CHOLHDL 3.0 11/20/2021 0949  ? VLDL 11 01/14/2017 1136  ? [place] 124 (H) 11/20/2021 0949  ? ? ?Physical Exam:   ? ?VS:  BP 140/84   Pulse 94   Ht _0  (1.6 m)   Wt 183 lb 14.4 oz (83.4 kg)  SpO2 95%   BMI 32.58 kg/m?    ? ?Wt Readings from Last 3 Encounters:  ?04/03/22 183 lb 14.4 oz (83.4 kg)  ?03/15/22 196 lb (88.9 kg)  ?01/23/22 196 lb (88.9 kg)  ?  ?GEN: Well nourished, well developed in no acute distress; In wheelchair. ?HEENT: Normal, moist mucous membranes ?NECK: No JVD ?CARDIAC: regular rhythm, normal S1 and S2, no rubs or gallops. No murmur. ?VASCULAR: Radial and DP pulses 2+ bilaterally. No carotid bruits ?RESPIRATORY:  Clear to auscultation without rales, wheezing or rhonchi  ?ABDOMEN: Soft, non-tender, non-distended ?MUSCULOSKELETAL:  Ambulates independently ?SKIN: Warm and dry, trivial bilateral LE edema.  ?NEUROLOGIC:  Alert and oriented x 3. No focal neuro deficits noted. ?PSYCHIATRIC:  Normal affect   ? ?ASSESSMENT:   ? ?1. Bilateral leg edema   ?2. Primary hypertension   ?3. Chronic diastolic heart failure ([place])   ?4. Medication management   ?5. At increased risk for cardiovascular disease   ? ? ?PLAN:   ? ?Bilateral LE edema: ?-improved on BID lasix, continue ?-suspect components of both chronic diastolic heart  failure and chronic venous insufficiency ?-echo without high risk findings ?-reviewed instructions on daily weights, salt avoidance, etc ?-encouraged activity as tolerated ?-BMET today ?-discussed SGLT2i, decli

## 2022-04-03 NOTE — Patient Instructions (Signed)
Medication Instructions:  ?Your Physician recommend you continue on your current medication as directed.   ? ?*If you need a refill on your cardiac medications before your next appointment, please call your pharmacy* ? ? ?Lab Work: ?Your provider has recommended lab work today (BMP). Please have this collected at [facility] [place]. The lab is open 8:00 am - 4:30 pm. Please avoid 12:00p - 1:00p for lunch hour. You do not need an appointment. Please go to [address] [place], [place] [address]. This is in the Primary Care office on the 3rd floor, let them know you are there for blood work and they will direct you to the lab. ? ?If you have labs (blood work) drawn today and your tests are completely normal, you will receive your results only by: ?MyChart Message (if you have MyChart) OR ?A paper copy in the mail ?If you have any lab test that is abnormal or we need to change your treatment, we will call you to review the results. ? ? ?Testing/Procedures: ?None ordered today ? ? ?Follow-Up: ?At [facility], you and your health needs are our priority.  As part of our continuing mission to provide you with exceptional heart care, we have created designated Provider Care Teams.  These Care Teams include your primary Cardiologist (physician) and Advanced Practice Providers (APPs -  Physician Assistants and Nurse Practitioners) who all work together to provide you with the care you need, when you need it. ? ?We recommend signing up for the patient portal called "MyChart".  Sign up information is provided on this After Visit Summary.  MyChart is used to connect with patients for Virtual Visits (Telemedicine).  Patients are able to view lab/test results, encounter notes, upcoming appointments, etc.  Non-urgent messages can be sent to your provider as well.   ?To learn more about what you can do with MyChart, go to [email or web address].   ? ?Your next appointment:   ?3 month(s) ? ?The  format for your next appointment:   ?In Person ? ?Provider:   ?Buford Dresser, MD{ ? ? ? ?

## 2022-04-04 LAB — BASIC METABOLIC PANEL
BUN/Creatinine Ratio: 24 (ref 12–28)
BUN: 24 mg/dL (ref 8–27)
CO2: 23 mmol/L (ref 20–29)
Calcium: 9.6 mg/dL (ref 8.7–10.3)
Chloride: 104 mmol/L (ref 96–106)
Creatinine, Ser: 1.01 mg/dL — ABNORMAL HIGH (ref 0.57–1.00)
Glucose: 105 mg/dL — ABNORMAL HIGH (ref 70–99)
Potassium: 4.9 mmol/L (ref 3.5–5.2)
Sodium: 145 mmol/L — ABNORMAL HIGH (ref 134–144)
eGFR: 57 mL/min/{1.73_m2} — ABNORMAL LOW (ref >59)

## 2022-04-10 ENCOUNTER — Ambulatory Visit (INDEPENDENT_AMBULATORY_CARE_PROVIDER_SITE_OTHER): Payer: Medicare Other | Admitting: Podiatry

## 2022-04-10 ENCOUNTER — Encounter: Payer: Self-pay | Admitting: Podiatry

## 2022-04-10 DIAGNOSIS — B351 Tinea unguium: Secondary | ICD-10-CM

## 2022-04-10 DIAGNOSIS — L600 Ingrowing nail: Secondary | ICD-10-CM | POA: Diagnosis not present

## 2022-04-10 DIAGNOSIS — M79675 Pain in left toe(s): Secondary | ICD-10-CM

## 2022-04-10 DIAGNOSIS — M79674 Pain in right toe(s): Secondary | ICD-10-CM | POA: Diagnosis not present

## 2022-04-10 NOTE — Progress Notes (Signed)
?  Subjective:  ?[name], female    DOB: 1941-03-11,  MRN: [id number] ? ?Chief Complaint  ?Patient presents with  ? Nail Problem  ?  RFC nail trim 1-5 bilat   ? ?81 y.o. female presents for f/u nails are thickened and painful today and she cannot cut them herself. ?Objective:  ?Physical Exam: warm, good capillary refill, nail exam onychomycosis of the toenails with pain to palpation, no trophic changes or ulcerative lesions, normal DP and PT pulses, and normal sensory exam. Pitting edema right leg ?No images are attached to the encounter. ? ?Assessment:  ? ?1. Ingrown nail   ?2. Pain due to onychomycosis of toenails of both feet   ? ? ? ?Plan:  ?Patient was evaluated and treated and all questions answered. ? ?Onychomycosis  ?-Again debrided thickened and ingrown nails to patient relief. F/u PRN. ? ?No follow-ups on file.  ?

## 2022-05-28 ENCOUNTER — Other Ambulatory Visit: Payer: Medicare Other

## 2022-05-28 DIAGNOSIS — I10 Essential (primary) hypertension: Secondary | ICD-10-CM | POA: Diagnosis not present

## 2022-05-28 DIAGNOSIS — R7302 Impaired glucose tolerance (oral): Secondary | ICD-10-CM

## 2022-05-28 DIAGNOSIS — E7800 Pure hypercholesterolemia, unspecified: Secondary | ICD-10-CM | POA: Diagnosis not present

## 2022-05-29 LAB — COMPLETE METABOLIC PANEL WITH GFR
AG Ratio: 1.5 (calc) (ref 1.0–2.5)
ALT: 10 U/L (ref 6–29)
AST: 16 U/L (ref 10–35)
Albumin: 4.2 g/dL (ref 3.6–5.1)
Alkaline phosphatase (APISO): 103 U/L (ref 37–153)
BUN: 19 mg/dL (ref 7–25)
CO2: 23 mmol/L (ref 20–32)
Calcium: 9.5 mg/dL (ref 8.6–10.4)
Chloride: 104 mmol/L (ref 98–110)
Creat: 0.86 mg/dL (ref 0.60–1.00)
Globulin: 2.8 g/dL (calc) (ref 1.9–3.7)
Glucose, Bld: 112 mg/dL — ABNORMAL HIGH (ref 65–99)
Potassium: 4.2 mmol/L (ref 3.5–5.3)
Sodium: 142 mmol/L (ref 135–146)
Total Bilirubin: 0.8 mg/dL (ref 0.2–1.2)
Total Protein: 7 g/dL (ref 6.1–8.1)
eGFR: 69 mL/min/{1.73_m2} (ref >=60)

## 2022-05-29 LAB — HEMOGLOBIN A1C
Hgb A1c MFr Bld: 5.8 % of total Hgb — ABNORMAL HIGH (ref <5.7)
Mean Plasma Glucose: 120 mg/dL
eAG (mmol/L): 6.6 mmol/L

## 2022-05-29 LAB — LIPID PANEL
Cholesterol: 190 mg/dL (ref <200)
HDL: 72 mg/dL (ref >=50)
LDL Cholesterol (Calc): 106 mg/dL (calc) — ABNORMAL HIGH
Non-HDL Cholesterol (Calc): 118 mg/dL (calc) (ref <130)
Total CHOL/HDL Ratio: 2.6 (calc) (ref <5.0)
Triglycerides: 40 mg/dL (ref <150)

## 2022-05-30 ENCOUNTER — Encounter: Payer: Self-pay | Admitting: Internal Medicine

## 2022-05-30 ENCOUNTER — Ambulatory Visit (INDEPENDENT_AMBULATORY_CARE_PROVIDER_SITE_OTHER): Payer: Medicare Other | Admitting: Internal Medicine

## 2022-05-30 VITALS — BP 180/92 | HR 111 | Temp 98.4°F | Ht 63.0 in | Wt 170.8 lb

## 2022-05-30 DIAGNOSIS — F411 Generalized anxiety disorder: Secondary | ICD-10-CM

## 2022-05-30 DIAGNOSIS — R600 Localized edema: Secondary | ICD-10-CM

## 2022-05-30 DIAGNOSIS — E7800 Pure hypercholesterolemia, unspecified: Secondary | ICD-10-CM | POA: Diagnosis not present

## 2022-05-30 DIAGNOSIS — L209 Atopic dermatitis, unspecified: Secondary | ICD-10-CM | POA: Insufficient documentation

## 2022-05-30 DIAGNOSIS — J3081 Allergic rhinitis due to animal (cat) (dog) hair and dander: Secondary | ICD-10-CM | POA: Insufficient documentation

## 2022-05-30 DIAGNOSIS — J45991 Cough variant asthma: Secondary | ICD-10-CM | POA: Insufficient documentation

## 2022-05-30 DIAGNOSIS — J3089 Other allergic rhinitis: Secondary | ICD-10-CM | POA: Diagnosis not present

## 2022-05-30 DIAGNOSIS — I10 Essential (primary) hypertension: Secondary | ICD-10-CM | POA: Diagnosis not present

## 2022-05-30 DIAGNOSIS — J301 Allergic rhinitis due to pollen: Secondary | ICD-10-CM | POA: Insufficient documentation

## 2022-05-30 DIAGNOSIS — R7302 Impaired glucose tolerance (oral): Secondary | ICD-10-CM | POA: Diagnosis not present

## 2022-05-30 NOTE — Patient Instructions (Addendum)
Continue to increase your activity as tolerated with healing of torn tendon in left leg. Watch BP at home. Medicare wellness and health maintenance exam due in November 2023. Labs reviewed and are stable.

## 2022-05-30 NOTE — Progress Notes (Signed)
   Subjective:    [name], female    DOB: 1941/01/12, 81 y.o.   MRN: [id number]  HPI Comes in today for  6 month follow up. She says Dr. Maureen Ralphs told her  she has partial tear of a tendon and is using a walker. Pulse is elevated today with minimal walking into the office. In February, Dr. Maureen Ralphs found arthritis in her left hip and referred her to Dr. Nelva Bush for a hip injection. An MRI of hip was ordered in March. Do not see any recent notes  in Epic from [facility].   Says highest BP at home was 150/70 and lowest has been 120/70.  Recent Echo had normal EF  and moderate mitral insufficiency  Says she and her sister have purchased a condo together and will be moving soon to the [place] area. They each will have separate bedrooms on either end og the condo.  Review of Systems see above     Objective:   Physical Exam  BP 180/92 pulse is 111 T 98.4 degrees pulse ox 96% weight 170 lbs. BMI 30.25 BP rechecked and is still elevated. Patient having musculoskeletal pain with ambulation. Is to let me know if BP is persistently elevated. Agrees to follow up soon with Ortho.  Lipid panel stable and improved: and LDL  improved at 106 from 124 in November. Total cholesterol improved from 210 to 190.  Glucose is 112, Creatinine improved from 1.01 to 0.86  Hgb AIC is 5.8% and was 5.6% in November 2022  PE: Skin is warm and dry.  No cervical adenopathy.  No carotid bruits.  Chest clear to auscultation.  Cardiac exam: Regular rate and rhythm without ectopy.    Assessment & Plan:  Impaired glucose tolerance-has increased to 5.8% from 5.6% in November 2022.  This is currently diet controlled and she needs to watch her diet a bit and be more active if possible given orthopedic issues  Pure hypercholesterolemia LDL has improved from 1 24-1 06.  Total cholesterol has improved from 210 in November to 190 now.  She declines statin medication.  Wants to continue to work on diet and  exercise.  BMI 30.25 -decreased from 31.89 in November 2022.  Weight is currently 170 pounds and was 180 pounds in November.  This is good progress.  Hip pain-currently being evaluated by [facility] and treated recently with a steroid injection which possibly could have increased her Hemoglobin A1c a bit  Anxiety-stable  Hypertension treated with Lasix 20 mg twice daily and Diovan 80 mg twice daily  Dependent edema treated with Lasix 20 mg twice daily per Dr. Harrell Gave  Allergic rhinitis treated with Allegra, Astelin, and albuterol as needed.  Plan: Medicare wellness and health maintenance exam due November 2023.

## 2022-06-07 ENCOUNTER — Encounter: Payer: Self-pay | Admitting: Gastroenterology

## 2022-06-26 ENCOUNTER — Encounter: Payer: Self-pay | Admitting: Podiatry

## 2022-06-26 ENCOUNTER — Ambulatory Visit (INDEPENDENT_AMBULATORY_CARE_PROVIDER_SITE_OTHER): Payer: Medicare Other | Admitting: Podiatry

## 2022-06-26 DIAGNOSIS — M79674 Pain in right toe(s): Secondary | ICD-10-CM

## 2022-06-26 DIAGNOSIS — M79675 Pain in left toe(s): Secondary | ICD-10-CM | POA: Diagnosis not present

## 2022-06-26 DIAGNOSIS — B351 Tinea unguium: Secondary | ICD-10-CM

## 2022-06-26 DIAGNOSIS — L600 Ingrowing nail: Secondary | ICD-10-CM | POA: Diagnosis not present

## 2022-06-26 NOTE — Progress Notes (Signed)
  Subjective:  [name], female    DOB: Jun 02, 1941,  MRN: [id number]  Chief Complaint  Patient presents with   Nail Problem    RFC Bilateral nail trim 1-5   81 y.o. female presents for f/u nails are thickened and painful today and she cannot cut them herself. Objective:  Physical Exam: warm, good capillary refill, nail exam onychomycosis of the toenails with pain to palpation, no trophic changes or ulcerative lesions, normal DP and PT pulses, and normal sensory exam. Pitting edema right leg No images are attached to the encounter.  Assessment:   1. Pain due to onychomycosis of toenails of both feet   2. Ingrown nail       Plan:  Patient was evaluated and treated and all questions answered.  Onychomycosis  -Again debrided thickened and ingrown nails to patient relief. F/u PRN.  Return in about 2 months (around 08/26/2022) for rfc.

## 2022-07-05 ENCOUNTER — Ambulatory Visit (INDEPENDENT_AMBULATORY_CARE_PROVIDER_SITE_OTHER): Payer: Medicare Other | Admitting: Cardiology

## 2022-07-05 ENCOUNTER — Encounter (HOSPITAL_BASED_OUTPATIENT_CLINIC_OR_DEPARTMENT_OTHER): Payer: Self-pay | Admitting: Cardiology

## 2022-07-05 VITALS — BP 136/78 | HR 107 | Ht 63.0 in | Wt 180.5 lb

## 2022-07-05 DIAGNOSIS — Z9189 Other specified personal risk factors, not elsewhere classified: Secondary | ICD-10-CM

## 2022-07-05 DIAGNOSIS — I10 Essential (primary) hypertension: Secondary | ICD-10-CM | POA: Diagnosis not present

## 2022-07-05 DIAGNOSIS — E7800 Pure hypercholesterolemia, unspecified: Secondary | ICD-10-CM

## 2022-07-05 DIAGNOSIS — I5032 Chronic diastolic (congestive) heart failure: Secondary | ICD-10-CM

## 2022-07-05 DIAGNOSIS — R000 Tachycardia, unspecified: Secondary | ICD-10-CM

## 2022-07-05 DIAGNOSIS — R600 Localized edema: Secondary | ICD-10-CM

## 2022-07-05 NOTE — Patient Instructions (Signed)
Medication Instructions:  Your Physician recommend you continue on your current medication as directed.    *If you need a refill on your cardiac medications before your next appointment, please call your pharmacy*   Lab Work: None ordered today   Testing/Procedures: None ordered today    Follow-Up: At [facility], you and your health needs are our priority.  As part of our continuing mission to provide you with exceptional heart care, we have created designated Provider Care Teams.  These Care Teams include your primary Cardiologist (physician) and Advanced Practice Providers (APPs -  Physician Assistants and Nurse Practitioners) who all work together to provide you with the care you need, when you need it.  We recommend signing up for the patient portal called "MyChart".  Sign up information is provided on this After Visit Summary.  MyChart is used to connect with patients for Virtual Visits (Telemedicine).  Patients are able to view lab/test results, encounter notes, upcoming appointments, etc.  Non-urgent messages can be sent to your provider as well.   To learn more about what you can do with MyChart, go to [email or web address].    Your next appointment:   3 month(s)  The format for your next appointment:   In Person  Provider:   Buford Dresser, MD{  Other Instructions Heart Healthy Diet Recommendations: A low-salt diet is recommended. Meats should be grilled, baked, or boiled. Avoid fried foods. Focus on lean protein sources like fish or chicken with vegetables and fruits. The American Heart Association is a Microbiologist!  American Heart Association Diet and Lifeystyle Recommendations   Exercise recommendations: The American Heart Association recommends 150 minutes of moderate intensity exercise weekly. Try 30 minutes of moderate intensity exercise 4-5 times per week. This could include walking, jogging, or swimming.   Important Information About  Sugar

## 2022-07-05 NOTE — Progress Notes (Signed)
Cardiology Office Note:    Date:  07/05/2022   ID:  Lisa, [name] 09-05-1941, MRN [id number]  PCP:  Elby Showers, MD  Cardiologist:  Buford Dresser, MD  Referring MD: Elby Showers, MD   CC: follow up  History of Present Illness:    Lisa [name] is a 81 y.o. female with a hx of asthma, hyperlipidemia, hypertension, obesity, arthritis, cervical cancer, and goiter, who is seen for follow up today. I initially met her 10/23/21 as a new consult at the request of Baxley, Cresenciano Lick, MD for the evaluation and management of tachycardia.  Cardiovascular risk factors: Prior clinical ASCVD: None Comorbid conditions: hypertension, hyperlipidemia Metabolic syndrome/Obesity:  Obesity, Chronic inflammatory conditions: none Tobacco use history: Not a smoker (2-3 months in college) Alcohol consumption: Every now and then Family history: mother and father had hypertension, father had heart disease  At her last appointment, her main concern was difficulty lifting her left leg. She was found to have a torn tendon in her left thigh which she received a steriodal injection and pain medication for. She was also taking Lasix and the swelling in her legs improved. Her blood pressures were ranged from 150s/70s a month prior to her appointment. She has seasonal allergies and was started on Flovent. She had plans to increase her exercise and continue tos tay hydrated. Her sodium intake is not very high. She was continued on her medication and encouraged to increase exercise and recommended a heart healthy/mediterranean diet.  Today, she is doing good. Sister is present for exam .   She recently started a regimen for allergies and asthma. In the mornings, she takes Allegra, her inhaler, nose spray, and saline spray. In the evenings, she takes her inhaler, claritin, and azelastine.   Her blood pressure is above her usual today. At other clinic visits her blood pressure has been as high as 944  systolic, but usually improves to the 140's on recheck. At home her blood pressure averages in the 140's/70s-80's. Her lowest BP recently was around 120-130 at home.  She has some bilateral swelling that goes to the ankles. She had her compression socks on today.  She denies any palpitations, chest pain, or shortness of breath. No lightheadedness, headaches, syncope, orthopnea, or PND.  Past Medical History:  Diagnosis Date   Allergy    Arthritis    Cancer ([place])    cervical   Complication of anesthesia    Difficulty waking up after hysterectomy in 1973   Goiter    Headache    Sinus headaches   Hyperlipidemia    Hypertension    Obesity    Osteopenia    Vitamin D deficiency     Past Surgical History:  Procedure Laterality Date   ABDOMINAL HYSTERECTOMY     EYE SURGERY     HAMMER TOE SURGERY  12/02   right   TOTAL KNEE ARTHROPLASTY Left 08/21/2020   Procedure: TOTAL KNEE ARTHROPLASTY;  Surgeon: Gaynelle Arabian, MD;  Location: [facility];  Service: Orthopedics;  Laterality: Left;  59mn   WISDOM TOOTH EXTRACTION      Current Medications: Current Outpatient Medications on File Prior to Visit  Medication Sig   acetaminophen (TYLENOL) 500 MG tablet Take 500-1,000 mg by mouth every 6 (six) hours as needed for mild pain or moderate pain.   albuterol (VENTOLIN HFA) 108 (90 Base) MCG/ACT inhaler Inhale 2 puffs into the lungs every 6 (six) hours as needed for wheezing or shortness of breath.  Azelastine HCl 137 MCG/SPRAY SOLN 1 spray ea nostril   fexofenadine (ALLEGRA) 180 MG tablet 1 tablet   FLOVENT HFA 110 MCG/ACT inhaler Inhale 2 puffs into the lungs 2 (two) times daily.   fluticasone (FLONASE) 50 MCG/ACT nasal spray Place 2 sprays into both nostrils daily as needed for allergies or rhinitis.   furosemide (LASIX) 20 MG tablet Take 1 tablet (20 mg total) by mouth 2 (two) times daily.   loratadine (CLARITIN) 10 MG tablet Take 10 mg by mouth daily.   Spacer/Aero-Holding Chambers DEVI 1  each by Does not apply route in the morning, at noon, in the evening, and at bedtime.   triamcinolone ointment (KENALOG) 0.1 % SMARTSIG:sparingly Topical Twice Daily PRN   valsartan (DIOVAN) 80 MG tablet Take 1 tablet (80 mg total) by mouth 2 (two) times daily.   No current facility-administered medications on file prior to visit.     Allergies:   Penicillins, Codeine, Darvon, Tall ragweed, and Iodinated i 131 albumin   Social History   Tobacco Use   Smoking status: Never   Smokeless tobacco: Never  Vaping Use   Vaping Use: Never used  Substance Use Topics   Alcohol use: No    Comment: Wine once or twice a year   Drug use: No    Family History: family history includes Heart disease in her father; Hypertension in her father and mother; Thyroid disease in her father.  ROS:   Please see the history of present illness.  All other systems are reviewed and negative.     EKGs/Labs/Other Studies Reviewed:    The following studies were reviewed today:  Echo 10/31/2021: Sonographer Comments: Image acquisition challenging due to patient body habitus.  IMPRESSIONS    1. Left ventricular ejection fraction, by estimation, is 60 to 65%. The  left ventricle has normal function. The left ventricle has no regional  wall motion abnormalities. Left ventricular diastolic parameters are  consistent with Grade II diastolic  dysfunction (pseudonormalization). Elevated left atrial pressure.   2. Right ventricular systolic function is normal. The right ventricular  size is normal.   3. Suspect that there is probably moderate eccentric mitral  insufficiency, but this is poorly visualized. The mitral valve is grossly  normal. No evidence of mitral valve regurgitation. No evidence of mitral  stenosis.   4. The aortic valve is normal in structure. Aortic valve regurgitation is  trivial. Mild to moderate aortic valve sclerosis/calcification is present,  without any evidence of aortic stenosis.    5. The inferior vena cava is normal in size with greater than 50%  respiratory variability, suggesting right atrial pressure of 3 mmHg.   Comparison(s): No prior Echocardiogram.   Conclusion(s)/Recommendation(s): If physical findings suggest more than  moderate MR, consider TEE for better evaluation.  LE Venous DVT 08/16/2021: Summary:  RIGHT:  - There is no evidence of deep vein thrombosis in the lower extremity.     - No cystic structure found in the popliteal fossa.     LEFT:  - No evidence of common femoral vein obstruction.   EKG:  EKG is personally reviewed 07/05/2022: sinus tachycardia at 107 bpm 01/23/2022: sinus tachycardia at 114 bpm 10/23/2021: NSR at 99 bpm, nonspecific ST pattern  Recent Labs: 08/10/2021: Brain Natriuretic Peptide 21 11/20/2021: Hemoglobin 12.8; Platelets 230; TSH 1.14 05/28/2022: ALT 10; BUN 19; Creat 0.86; Potassium 4.2; Sodium 142   Recent Lipid Panel    Component Value Date/Time   CHOL 190 05/28/2022 0956  TRIG 40 05/28/2022 0956   HDL 72 05/28/2022 0956   CHOLHDL 2.6 05/28/2022 0956   VLDL 11 01/14/2017 1136   LDLCALC 106 (H) 05/28/2022 0956    Physical Exam:    VS:  BP 136/78 (BP Location: Left Arm, Patient Position: Sitting, Cuff Size: Large)   Pulse (!) 107   Ht 5' 3"  (1.6 m)   Wt 180 lb 8 oz (81.9 kg)   BMI 31.97 kg/m     Wt Readings from Last 3 Encounters:  07/05/22 180 lb 8 oz (81.9 kg)  05/30/22 170 lb 12 oz (77.5 kg)  04/03/22 183 lb 14.4 oz (83.4 kg)    GEN: Well nourished, well developed in no acute distress HEENT: Normal, moist mucous membranes NECK: No JVD CARDIAC: regular rhythm, normal S1 and S2, no rubs or gallops. No murmur. VASCULAR: Radial and DP pulses 2+ bilaterally. No carotid bruits RESPIRATORY:  Clear to auscultation without rales, wheezing or rhonchi  ABDOMEN: Soft, non-tender, non-distended MUSCULOSKELETAL:  Ambulates independently with walker SKIN: Warm and dry, compression stockings in place. Pitting  edema bilaterally to just above ankle NEUROLOGIC:  Alert and oriented x 3. No focal neuro deficits noted. PSYCHIATRIC:  Normal affect    ASSESSMENT:    1. Primary hypertension   2. Bilateral leg edema   3. Chronic diastolic heart failure ([place])   4. At increased risk for cardiovascular disease   5. Sinus tachycardia   6. Pure hypercholesterolemia      PLAN:    Bilateral LE edema: -stable on BID lasix, continue -suspect components of both chronic diastolic heart failure and chronic venous insufficiency -echo without high risk findings -reviewed instructions on daily weights, salt avoidance, etc -encouraged activity as tolerated -discussed SGLT2i, declines at this time -discussed compression, elevation. Doing well with compression stockings.  Hypertension -recheck improved -continue valsartan 80 mg BID, furosemide 20 mg bid -was on amlodipine prior, did not significantly change swelling, if BP remains elevated could consider adding back  Hypercholesterolemia -was on rosuvastatin 5 mg 3x/week previously, reported not taking at her visit with Dr. Renold Genta 12/18/21. Reassess at follow up -she has a very elevated ASCVD risk score  Sinus tachycardia -stable on ECG  Cardiac risk counseling and prevention recommendations: -recommend heart healthy/Mediterranean diet, with whole grains, fruits, vegetable, fish, lean meats, nuts, and olive oil. Limit salt. -recommend moderate walking, 3-5 times/week for 30-50 minutes each session. Aim for at least 150 minutes.week. Goal should be pace of 3 miles/hours, or walking 1.5 miles in 30 minutes -recommend avoidance of tobacco products. Avoid excess alcohol. -ASCVD risk score: The 10-year ASCVD risk score (Arnett DK, et al., 2019) is: 42.7%   Values used to calculate the score:     Age: 34 years     Sex: Female     Is Non-Hispanic African American: Yes     Diabetic: Yes     Tobacco smoker: No     Systolic Blood Pressure: 614 mmHg     Is BP  treated: Yes     HDL Cholesterol: 72 mg/dL     Total Cholesterol: 190 mg/dL    Plan for follow up: 3 months or sooner as needed.  Buford Dresser, MD, PhD, [facility]    Medication Adjustments/Labs and Tests Ordered: Current medicines are reviewed at length with the patient today.  Concerns regarding medicines are outlined above.   Orders Placed This Encounter  Procedures   EKG 12-Lead   No orders of the defined  types were placed in this encounter.  Patient Instructions  Medication Instructions:  Your Physician recommend you continue on your current medication as directed.    *If you need a refill on your cardiac medications before your next appointment, please call your pharmacy*   Lab Work: None ordered today   Testing/Procedures: None ordered today    Follow-Up: At [facility], you and your health needs are our priority.  As part of our continuing mission to provide you with exceptional heart care, we have created designated Provider Care Teams.  These Care Teams include your primary Cardiologist (physician) and Advanced Practice Providers (APPs -  Physician Assistants and Nurse Practitioners) who all work together to provide you with the care you need, when you need it.  We recommend signing up for the patient portal called "MyChart".  Sign up information is provided on this After Visit Summary.  MyChart is used to connect with patients for Virtual Visits (Telemedicine).  Patients are able to view lab/test results, encounter notes, upcoming appointments, etc.  Non-urgent messages can be sent to your provider as well.   To learn more about what you can do with MyChart, go to [email or web address].    Your next appointment:   3 month(s)  The format for your next appointment:   In Person  Provider:   Buford Dresser, MD{  Other Instructions Heart Healthy Diet Recommendations: A low-salt diet is recommended. Meats should be  grilled, baked, or boiled. Avoid fried foods. Focus on lean protein sources like fish or chicken with vegetables and fruits. The American Heart Association is a Microbiologist!  American Heart Association Diet and Lifeystyle Recommendations   Exercise recommendations: The American Heart Association recommends 150 minutes of moderate intensity exercise weekly. Try 30 minutes of moderate intensity exercise 4-5 times per week. This could include walking, jogging, or swimming.   Important Information About Sugar         I,Jenifer Velasquez,acting as a scribe for [employer], MD.,have documented all relevant documentation on the behalf of Buford Dresser, MD,as directed by  Buford Dresser, MD while in the presence of Buford Dresser, MD.  I, Buford Dresser, MD, have reviewed all documentation for this visit. The documentation on 07/05/22 for the exam, diagnosis, procedures, and orders are all accurate and complete.   Signed, Buford Dresser, MD PhD 07/05/2022     [place]

## 2022-07-10 ENCOUNTER — Ambulatory Visit: Payer: Medicare Other | Admitting: Podiatry

## 2022-08-20 ENCOUNTER — Encounter: Payer: Self-pay | Admitting: Podiatry

## 2022-08-20 ENCOUNTER — Ambulatory Visit (INDEPENDENT_AMBULATORY_CARE_PROVIDER_SITE_OTHER): Payer: Medicare Other | Admitting: Podiatry

## 2022-08-20 DIAGNOSIS — B351 Tinea unguium: Secondary | ICD-10-CM

## 2022-08-20 DIAGNOSIS — M79674 Pain in right toe(s): Secondary | ICD-10-CM

## 2022-08-20 DIAGNOSIS — M79675 Pain in left toe(s): Secondary | ICD-10-CM | POA: Diagnosis not present

## 2022-08-20 NOTE — Progress Notes (Signed)
  Subjective:  [name], female    DOB: 1941-11-07,  MRN: [id number]  Chief Complaint  Patient presents with   Debridement    Trim toenails/calluses   81 y.o. female presents for f/u nails are thickened and painful today and she cannot cut them herself. Objective:  Physical Exam: warm, good capillary refill, nail exam onychomycosis of the toenails with pain to palpation, no trophic changes or ulcerative lesions, normal DP and PT pulses, and normal sensory exam. Pitting edema right leg No images are attached to the encounter.  Assessment:   1. Pain due to onychomycosis of toenails of both feet       Plan:  Patient was evaluated and treated and all questions answered.  Onychomycosis  -Again debrided thickened and ingrown nails to patient relief. F/u PRN.  Return in about 3 months (around 11/20/2022) for rfc.

## 2022-08-28 ENCOUNTER — Ambulatory Visit: Payer: Medicare Other | Admitting: Podiatry

## 2022-09-17 DIAGNOSIS — Z1231 Encounter for screening mammogram for malignant neoplasm of breast: Secondary | ICD-10-CM | POA: Diagnosis not present

## 2022-09-17 LAB — HM MAMMOGRAPHY

## 2022-09-18 ENCOUNTER — Encounter: Payer: Self-pay | Admitting: Internal Medicine

## 2022-09-23 ENCOUNTER — Ambulatory Visit: Payer: Medicare Other | Admitting: Podiatry

## 2022-09-24 ENCOUNTER — Ambulatory Visit: Payer: Medicare Other | Admitting: Gastroenterology

## 2022-10-07 DIAGNOSIS — Z23 Encounter for immunization: Secondary | ICD-10-CM | POA: Diagnosis not present

## 2022-10-14 DIAGNOSIS — J45991 Cough variant asthma: Secondary | ICD-10-CM | POA: Diagnosis not present

## 2022-10-14 DIAGNOSIS — J3089 Other allergic rhinitis: Secondary | ICD-10-CM | POA: Diagnosis not present

## 2022-10-14 DIAGNOSIS — J3081 Allergic rhinitis due to animal (cat) (dog) hair and dander: Secondary | ICD-10-CM | POA: Diagnosis not present

## 2022-10-14 DIAGNOSIS — J301 Allergic rhinitis due to pollen: Secondary | ICD-10-CM | POA: Diagnosis not present

## 2022-10-16 ENCOUNTER — Encounter (HOSPITAL_BASED_OUTPATIENT_CLINIC_OR_DEPARTMENT_OTHER): Payer: Self-pay | Admitting: Cardiology

## 2022-10-16 ENCOUNTER — Ambulatory Visit (INDEPENDENT_AMBULATORY_CARE_PROVIDER_SITE_OTHER): Payer: Medicare Other | Admitting: Cardiology

## 2022-10-16 VITALS — BP 126/72 | HR 106 | Ht 63.0 in | Wt 180.8 lb

## 2022-10-16 DIAGNOSIS — I5032 Chronic diastolic (congestive) heart failure: Secondary | ICD-10-CM

## 2022-10-16 DIAGNOSIS — R000 Tachycardia, unspecified: Secondary | ICD-10-CM

## 2022-10-16 DIAGNOSIS — E7800 Pure hypercholesterolemia, unspecified: Secondary | ICD-10-CM | POA: Diagnosis not present

## 2022-10-16 DIAGNOSIS — R600 Localized edema: Secondary | ICD-10-CM | POA: Diagnosis not present

## 2022-10-16 DIAGNOSIS — I10 Essential (primary) hypertension: Secondary | ICD-10-CM | POA: Diagnosis not present

## 2022-10-16 NOTE — Patient Instructions (Signed)
Medication Instructions:  Your Physician recommend you continue on your current medication as directed.    *If you need a refill on your cardiac medications before your next appointment, please call your pharmacy*   Lab Work: None ordered today   Testing/Procedures: None ordered today   Follow-Up: At [facility], you and your health needs are our priority.  As part of our continuing mission to provide you with exceptional heart care, we have created designated Provider Care Teams.  These Care Teams include your primary Cardiologist (physician) and Advanced Practice Providers (APPs -  Physician Assistants and Nurse Practitioners) who all work together to provide you with the care you need, when you need it.  We recommend signing up for the patient portal called "MyChart".  Sign up information is provided on this After Visit Summary.  MyChart is used to connect with patients for Virtual Visits (Telemedicine).  Patients are able to view lab/test results, encounter notes, upcoming appointments, etc.  Non-urgent messages can be sent to your provider as well.   To learn more about what you can do with MyChart, go to [email or web address].    Your next appointment:   3 month(s)  The format for your next appointment:   In Person  Provider:   Bridgette Christopher, MD          

## 2022-10-16 NOTE — Progress Notes (Signed)
Cardiology Office Note:    Date:  10/16/2022   ID:  Lisa, [name] 11/12/1941, MRN [id number]  PCP:  Elby Showers, MD  Cardiologist:  Buford Dresser, MD  Referring MD: Elby Showers, MD   CC: follow up  History of Present Illness:    Lisa [name] is a 81 y.o. female with a hx of asthma, hyperlipidemia, hypertension, obesity, arthritis, cervical cancer, and goiter, who is seen for follow up today. I initially met her 10/23/21 as a new consult at the request of Baxley, Cresenciano Lick, MD for the evaluation and management of tachycardia.  Cardiovascular risk factors: Prior clinical ASCVD: None Comorbid conditions: hypertension, hyperlipidemia Metabolic syndrome/Obesity:  Obesity, Chronic inflammatory conditions: none Tobacco use history: Not a smoker (2-3 months in college) Alcohol consumption: Every now and then Family history: mother and father had hypertension, father had heart disease  Today:  She is accompanied by her sister today. She says she is feeling pretty good.   She is still experiencing some bilateral LE edema, but it appears to be milder than before and does not go up as high on her legs.  She endorses white coat hypertension and states that she has always been this way when she goes to clinics. Upon recheck in clinic, her blood pressure read 126/72.   She reports that she has been eating out more recently and probably eating too much salt. When she does not eat out, however, she tries to control and limit her salt intake.  She denies any palpitations, chest pain, or shortness of breath. No lightheadedness, headaches, syncope, orthopnea, or PND.  Past Medical History:  Diagnosis Date   Allergy    Arthritis    Cancer ([place])    cervical   Complication of anesthesia    Difficulty waking up after hysterectomy in 1973   Goiter    Headache    Sinus headaches   Hyperlipidemia    Hypertension    Obesity    Osteopenia    Vitamin D deficiency      Past Surgical History:  Procedure Laterality Date   ABDOMINAL HYSTERECTOMY     EYE SURGERY     HAMMER TOE SURGERY  12/02   right   TOTAL KNEE ARTHROPLASTY Left 08/21/2020   Procedure: TOTAL KNEE ARTHROPLASTY;  Surgeon: Gaynelle Arabian, MD;  Location: [facility];  Service: Orthopedics;  Laterality: Left;  40mn   WISDOM TOOTH EXTRACTION      Current Medications: Current Outpatient Medications on File Prior to Visit  Medication Sig   acetaminophen (TYLENOL) 500 MG tablet Take 500-1,000 mg by mouth every 6 (six) hours as needed for mild pain or moderate pain.   albuterol (VENTOLIN HFA) 108 (90 Base) MCG/ACT inhaler Inhale 2 puffs into the lungs every 6 (six) hours as needed for wheezing or shortness of breath.   Azelastine HCl 137 MCG/SPRAY SOLN 1 spray ea nostril   budesonide (PULMICORT FLEXHALER) 180 MCG/ACT inhaler 1 puff   fexofenadine (ALLEGRA) 180 MG tablet 1 tablet   fluticasone (FLONASE) 50 MCG/ACT nasal spray Place 2 sprays into both nostrils daily as needed for allergies or rhinitis.   furosemide (LASIX) 20 MG tablet Take 1 tablet (20 mg total) by mouth 2 (two) times daily.   loratadine (CLARITIN) 10 MG tablet Take 10 mg by mouth daily.   Spacer/Aero-Holding Chambers DEVI 1 each by Does not apply route in the morning, at noon, in the evening, and at bedtime.   triamcinolone ointment (KENALOG) 0.1 %  SMARTSIG:sparingly Topical Twice Daily PRN   valsartan (DIOVAN) 80 MG tablet Take 1 tablet (80 mg total) by mouth 2 (two) times daily.   No current facility-administered medications on file prior to visit.     Allergies:   Penicillins, Codeine, Darvon, Tall ragweed, and Iodinated i 131 albumin   Social History   Tobacco Use   Smoking status: Never   Smokeless tobacco: Never  Vaping Use   Vaping Use: Never used  Substance Use Topics   Alcohol use: No    Comment: Wine once or twice a year   Drug use: No    Family History: family history includes Heart disease in her  father; Hypertension in her father and mother; Thyroid disease in her father.  ROS:   Please see the history of present illness. (+) Bilateral LE edema  All other systems are reviewed and negative.     EKGs/Labs/Other Studies Reviewed:    The following studies were reviewed today:  Echo 10/31/2021: Sonographer Comments: Image acquisition challenging due to patient body habitus.  IMPRESSIONS    1. Left ventricular ejection fraction, by estimation, is 60 to 65%. The  left ventricle has normal function. The left ventricle has no regional  wall motion abnormalities. Left ventricular diastolic parameters are  consistent with Grade II diastolic  dysfunction (pseudonormalization). Elevated left atrial pressure.   2. Right ventricular systolic function is normal. The right ventricular  size is normal.   3. Suspect that there is probably moderate eccentric mitral  insufficiency, but this is poorly visualized. The mitral valve is grossly  normal. No evidence of mitral valve regurgitation. No evidence of mitral  stenosis.   4. The aortic valve is normal in structure. Aortic valve regurgitation is  trivial. Mild to moderate aortic valve sclerosis/calcification is present,  without any evidence of aortic stenosis.   5. The inferior vena cava is normal in size with greater than 50%  respiratory variability, suggesting right atrial pressure of 3 mmHg.   Comparison(s): No prior Echocardiogram.   Conclusion(s)/Recommendation(s): If physical findings suggest more than  moderate MR, consider TEE for better evaluation.  LE Venous DVT 08/16/2021: Summary:  RIGHT:  - There is no evidence of deep vein thrombosis in the lower extremity.     - No cystic structure found in the popliteal fossa.     LEFT:  - No evidence of common femoral vein obstruction.   EKG:  EKG is personally reviewed 10/16/22: not ordered today 07/05/2022: sinus tachycardia at 107 bpm 01/23/2022: sinus tachycardia at 114  bpm 10/23/2021: NSR at 99 bpm, nonspecific ST pattern  Recent Labs: 11/20/2021: Hemoglobin 12.8; Platelets 230; TSH 1.14 05/28/2022: ALT 10; BUN 19; Creat 0.86; Potassium 4.2; Sodium 142   Recent Lipid Panel    Component Value Date/Time   CHOL 190 05/28/2022 0956   TRIG 40 05/28/2022 0956   HDL 72 05/28/2022 0956   CHOLHDL 2.6 05/28/2022 0956   VLDL 11 01/14/2017 1136   LDLCALC 106 (H) 05/28/2022 0956    Physical Exam:    VS:  BP 126/72 (BP Location: Right Arm, Patient Position: Sitting, Cuff Size: Normal)   Pulse (!) 106   Ht 5' 3"  (1.6 m)   Wt 180 lb 12.8 oz (82 kg)   SpO2 97%   BMI 32.03 kg/m     Wt Readings from Last 3 Encounters:  07/05/22 180 lb 8 oz (81.9 kg)  05/30/22 170 lb 12 oz (77.5 kg)  04/03/22 183 lb 14.4 oz (83.4 kg)  GEN: Well nourished, well developed in no acute distress HEENT: Normal, moist mucous membranes NECK: No JVD CARDIAC: regular rhythm, normal S1 and S2, no rubs or gallops. No murmur. VASCULAR: Radial and DP pulses 2+ bilaterally. No carotid bruits RESPIRATORY:  Clear to auscultation without rales, wheezing or rhonchi  ABDOMEN: Soft, non-tender, non-distended MUSCULOSKELETAL:  Ambulates independently with walker SKIN: Warm and dry, compression stockings in place. Pitting edema bilaterally to just above ankle NEUROLOGIC:  Alert and oriented x 3. No focal neuro deficits noted. PSYCHIATRIC:  Normal affect    ASSESSMENT:    1. Primary hypertension   2. Bilateral leg edema   3. Chronic diastolic heart failure ([place])   4. Sinus tachycardia   5. Pure hypercholesterolemia     PLAN:    Bilateral LE edema: -stable on BID lasix, continue -suspect components of both chronic diastolic heart failure and chronic venous insufficiency -echo without high risk findings -reviewed instructions on daily weights, salt avoidance, etc -encouraged activity as tolerated -discussed SGLT2i, declines at this time -discussed compression, elevation. Doing  well with compression stockings.  Hypertension -recheck improved -continue valsartan 80 mg BID, furosemide 20 mg bid -was on amlodipine prior, did not significantly change swelling, if BP remains elevated could consider adding back  Hypercholesterolemia -was on rosuvastatin 5 mg 3x/week previously. Had myalgia. Continue to discuss alternatives -she has a very elevated ASCVD risk score  Sinus tachycardia -stable  Cardiac risk counseling and prevention recommendations: -recommend heart healthy/Mediterranean diet, with whole grains, fruits, vegetable, fish, lean meats, nuts, and olive oil. Limit salt. -recommend moderate walking, 3-5 times/week for 30-50 minutes each session. Aim for at least 150 minutes.week. Goal should be pace of 3 miles/hours, or walking 1.5 miles in 30 minutes -recommend avoidance of tobacco products. Avoid excess alcohol. -ASCVD risk score: The 10-year ASCVD risk score (Arnett DK, et al., 2019) is: 42.7%   Values used to calculate the score:     Age: 20 years     Sex: Female     Is Non-Hispanic African American: Yes     Diabetic: Yes     Tobacco smoker: No     Systolic Blood Pressure: 349 mmHg     Is BP treated: Yes     HDL Cholesterol: 72 mg/dL     Total Cholesterol: 190 mg/dL    Plan for follow up: 3 months.  Buford Dresser, MD, PhD, [facility]    Medication Adjustments/Labs and Tests Ordered: Current medicines are reviewed at length with the patient today.  Concerns regarding medicines are outlined above.   No orders of the defined types were placed in this encounter.  No orders of the defined types were placed in this encounter.  Patient Instructions  Medication Instructions:  Your Physician recommend you continue on your current medication as directed.    *If you need a refill on your cardiac medications before your next appointment, please call your pharmacy*   Lab Work: None ordered  today   Testing/Procedures: None ordered today   Follow-Up: At [facility], you and your health needs are our priority.  As part of our continuing mission to provide you with exceptional heart care, we have created designated Provider Care Teams.  These Care Teams include your primary Cardiologist (physician) and Advanced Practice Providers (APPs -  Physician Assistants and Nurse Practitioners) who all work together to provide you with the care you need, when you need it.  We recommend signing up for the patient  portal called "MyChart".  Sign up information is provided on this After Visit Summary.  MyChart is used to connect with patients for Virtual Visits (Telemedicine).  Patients are able to view lab/test results, encounter notes, upcoming appointments, etc.  Non-urgent messages can be sent to your provider as well.   To learn more about what you can do with MyChart, go to [email or web address].    Your next appointment:   3 month(s)  The format for your next appointment:   In Person  Provider:   Buford Dresser, MD         I,Breanna Adamick,acting as a scribe for Buford Dresser, MD.,have documented all relevant documentation on the behalf of Buford Dresser, MD,as directed by  Buford Dresser, MD while in the presence of Buford Dresser, MD.   I, Buford Dresser, MD, have reviewed all documentation for this visit. The documentation on 10/16/22 for the exam, diagnosis, procedures, and orders are all accurate and complete.   Signed, Buford Dresser, MD PhD 10/16/2022     [place]

## 2022-10-21 ENCOUNTER — Telehealth: Payer: Self-pay | Admitting: Internal Medicine

## 2022-10-21 NOTE — Telephone Encounter (Signed)
Patient called and said she needs to get her flu shot and she wasn't sure if she needed to get the regular or the high dose, She didn't know which one she got last year either. Call back is [phone]

## 2022-10-21 NOTE — Telephone Encounter (Signed)
Patient was called and informed of message

## 2022-10-25 ENCOUNTER — Other Ambulatory Visit (HOSPITAL_BASED_OUTPATIENT_CLINIC_OR_DEPARTMENT_OTHER): Payer: Self-pay

## 2022-10-25 MED ORDER — FLUAD QUADRIVALENT 0.5 ML IM PRSY
PREFILLED_SYRINGE | INTRAMUSCULAR | 0 refills | Status: DC
  Filled 2022-10-25: qty 0.5, 1d supply, fill #0

## 2022-10-31 ENCOUNTER — Ambulatory Visit (INDEPENDENT_AMBULATORY_CARE_PROVIDER_SITE_OTHER): Payer: Medicare Other | Admitting: Podiatry

## 2022-10-31 DIAGNOSIS — B351 Tinea unguium: Secondary | ICD-10-CM | POA: Diagnosis not present

## 2022-10-31 DIAGNOSIS — L84 Corns and callosities: Secondary | ICD-10-CM | POA: Diagnosis not present

## 2022-10-31 DIAGNOSIS — M79675 Pain in left toe(s): Secondary | ICD-10-CM

## 2022-10-31 DIAGNOSIS — M79674 Pain in right toe(s): Secondary | ICD-10-CM | POA: Diagnosis not present

## 2022-10-31 NOTE — Progress Notes (Signed)
  Subjective:  [name], female    DOB: 04-15-1941,  MRN: [id number]  Chief Complaint  Patient presents with   Nail Problem    Nail Trim , possible corn on 5th toe of right foot     81 y.o. female presents with the above complaint. History confirmed with patient. Patient presenting with pain related to dystrophic thickened elongated nails. Patient is unable to trim own nails related to nail dystrophy and/or mobility issues. Patient does not have a history of T2DM. Patient does have callus present located at the dorsolateral aspect of the right foot 5th PIPJ causing pain.   Objective:  Physical Exam: warm, good capillary refill nail exam onychomycosis of the toenails, onycholysis, and dystrophic nails DP pulses palpable, PT pulses palpable, and protective sensation intact Left Foot:  Pain with palpation of nails due to elongation and dystrophic growth.  Right Foot: Pain with palpation of nails due to elongation and dystrophic growth. Hyperkeratotic lesion present dorsal lateral aspect fifth toe proximal interphalangeal joint related to adductovarus toe deformity pain with palpation of this area.  Assessment:   1. Pain due to onychomycosis of toenails of both feet   2. Corns and callosities      Plan:  Patient was evaluated and treated and all questions answered.  #Hyperkeratotic lesions/pre ulcerative calluses present dorsolateral aspect right 5th toe All symptomatic hyperkeratoses x 1 separate lesions were safely debrided with a sterile #10 blade to patient's level of comfort without incident. We discussed preventative and palliative care of these lesions including supportive and accommodative shoegear, padding, prefabricated and custom molded accommodative orthoses, use of a pumice stone and lotions/creams daily.  #Onychomycosis with pain  -Nails palliatively debrided as below. -Educated on self-care  Procedure: Nail Debridement Rationale: Pain Type of  Debridement: manual, sharp debridement. Instrumentation: Nail nipper, rotary burr. Number of Nails: 10  Return in about 9 weeks (around 01/02/2023) for RFC.         Everitt Amber, DPM [place] [facility]

## 2022-11-11 ENCOUNTER — Encounter: Payer: Self-pay | Admitting: Physician Assistant

## 2022-11-11 ENCOUNTER — Ambulatory Visit (INDEPENDENT_AMBULATORY_CARE_PROVIDER_SITE_OTHER): Payer: Medicare Other | Admitting: Physician Assistant

## 2022-11-11 VITALS — BP 141/82 | HR 121 | Ht 63.0 in | Wt 180.0 lb

## 2022-11-11 DIAGNOSIS — Z86010 Personal history of colonic polyps: Secondary | ICD-10-CM | POA: Diagnosis not present

## 2022-11-11 NOTE — Patient Instructions (Addendum)
Follow up in June 2024 to discuss colonoscopy.  It was a pleasure to meet you and your sister today, Ellouise Newer, PA-C

## 2022-11-11 NOTE — Progress Notes (Signed)
Chief Complaint: Discuss colonoscopy  Old Data previously reviewed:   She underwent a colonoscopy at [facility] gastroenterology with Dr. Oletta Lamas August 2019.  I was able to view his procedure report from that.  Indications "3+ centimeter villous polyp in the cecum removed in [place]" findings diverticulosis, single subcentimeter adenoma was removed, scar was evident in the cecum as well.  Dr. Oletta Lamas recommended that she have a repeat colonoscopy at 5-year interval.   Colonoscopy May 2016 [place], scar site and cecum noted.  Another single subcentimeter polyp was removed.  Repeat colonoscopy recommended at 3-year interval   Colonoscopy 2015 [place] documented and removed a 3 cm cecal polyp as well as 2 or 3 other polyps spread throughout the colon.   Blood work November 2021 shows normal hemoglobin.  Normal platelets.  Normal creatinine   Note from Dr. Renold Genta November 2021 reads "tubular adenoma removed by Dr. Oletta Lamas and need follow-up in 2022.  She would like to see Dr. Collene Mares records will be faxed to her.   HPI:    Lisa [name] is a 81 year old African-American female with a past medical history as listed below including hypertension (10/31/2021 echo with LVEF 60-65%), known to Dr. Ardis Hughs, who presents to clinic today to discuss her history of colon polyps.    04/03/2021 patient seen in clinic by Dr. Ardis Hughs.  At that time was there to discuss a colonoscopy.  Noted that her last colonoscopy was as above in August 2019.  With recommendations to repeat in 5 years.  At that time she was put in recall for a colonoscopy around August 2024.  She was to call with any other concerns.    Today, the patient presents to clinic accompanied by her sister and tells me that she is just here for her recall colonoscopy discussion.  She thought that it was due this year.  Tells me in general her health has not really changed she has developed some year-round allergies for which she is on medicine as well  as allergy related to asthma for which she is on Flovent.  This is well controlled though.  Does tell me her medicine she is on for high blood pressure stopped working so they have since switched her to something else and that is working well.  She recently followed with cardiology who told her she is doing good.  Does discuss some increase in gas in her GI system but tells me she eats a lot of dairy with an increase of yogurt lately and loves broccoli.  She does not feel like it is any different.    Denies fever, chills, weight loss, blood in her stool, change in bowel habits, abdominal pain, nausea, vomiting, heartburn or reflux.  Past Medical History:  Diagnosis Date   Allergy    Arthritis    Cancer ([place])    cervical   Complication of anesthesia    Difficulty waking up after hysterectomy in 1973   Goiter    Headache    Sinus headaches   Hyperlipidemia    Hypertension    Obesity    Osteopenia    Vitamin D deficiency     Past Surgical History:  Procedure Laterality Date   ABDOMINAL HYSTERECTOMY     EYE SURGERY     HAMMER TOE SURGERY  12/02   right   TOTAL KNEE ARTHROPLASTY Left 08/21/2020   Procedure: TOTAL KNEE ARTHROPLASTY;  Surgeon: Gaynelle Arabian, MD;  Location: [facility];  Service: Orthopedics;  Laterality: Left;  14mn  WISDOM TOOTH EXTRACTION      Current Outpatient Medications  Medication Sig Dispense Refill   acetaminophen (TYLENOL) 500 MG tablet Take 500-1,000 mg by mouth every 6 (six) hours as needed for mild pain or moderate pain.     albuterol (VENTOLIN HFA) 108 (90 Base) MCG/ACT inhaler Inhale 2 puffs into the lungs every 6 (six) hours as needed for wheezing or shortness of breath. 8 g 0   Azelastine HCl 137 MCG/SPRAY SOLN 1 spray ea nostril     fexofenadine (ALLEGRA) 180 MG tablet 1 tablet     fluticasone (FLONASE) 50 MCG/ACT nasal spray Place 2 sprays into both nostrils daily as needed for allergies or rhinitis.     fluticasone (FLOVENT HFA) 110 MCG/ACT inhaler 2  puffs Inhalation Twice a day for 30 days     furosemide (LASIX) 20 MG tablet Take 1 tablet (20 mg total) by mouth 2 (two) times daily. 180 tablet 3   influenza vaccine adjuvanted (FLUAD QUADRIVALENT) 0.5 ML injection Inject into the muscle. 0.5 mL 0   loratadine (CLARITIN) 10 MG tablet Take 10 mg by mouth daily.     Spacer/Aero-Holding Chambers DEVI 1 each by Does not apply route in the morning, at noon, in the evening, and at bedtime. 1 each 0   triamcinolone ointment (KENALOG) 0.1 % SMARTSIG:sparingly Topical Twice Daily PRN     valsartan (DIOVAN) 80 MG tablet Take 1 tablet (80 mg total) by mouth 2 (two) times daily. 180 tablet 3   No current facility-administered medications for this visit.    Allergies as of 11/11/2022 - Review Complete 10/31/2022  Allergen Reaction Noted   Penicillins Other (See Comments) 08/26/2011   Codeine Other (See Comments) 08/26/2011   Darvon Nausea And Vomiting 08/26/2011   Tall ragweed Itching and Other (See Comments) 08/09/2020   Iodinated i 131 albumin Rash 02/19/2022    Family History  Problem Relation Age of Onset   Hypertension Mother    Heart disease Father    Hypertension Father    Thyroid disease Father     Social History   Socioeconomic History   Marital status: Widowed    Spouse name: Not on file   Number of children: Not on file   Years of education: Not on file   Highest education level: Not on file  Occupational History   Not on file  Tobacco Use   Smoking status: Never   Smokeless tobacco: Never  Vaping Use   Vaping Use: Never used  Substance and Sexual Activity   Alcohol use: No    Comment: Wine once or twice a year   Drug use: No   Sexual activity: Not on file    Comment: Hysterectomy  Other Topics Concern   Not on file  Social History Narrative   Not on file   Social Determinants of Health   Financial Resource Strain: Not on file  Food Insecurity: Not on file  Transportation Needs: Not on file  Physical  Activity: Not on file  Stress: Not on file  Social Connections: Not on file  Intimate Partner Violence: Not on file    Review of Systems:    Constitutional: No weight loss, fever or chills Cardiovascular: No chest pain Respiratory: No SOB  Gastrointestinal: See HPI and otherwise negative   Physical Exam:  Vital signs: BP (!) 141/82   Pulse (!) 121   Ht '5\' 3"'$  (1.6 m)   Wt 180 lb (81.6 kg)   BMI 31.89 kg/m  Constitutional:   Pleasant Elderly AA female appears to be in NAD, Well developed, Well nourished, alert and cooperative Respiratory: Respirations even and unlabored. Lungs clear to auscultation bilaterally.   No wheezes, crackles, or rhonchi.  Cardiovascular: Normal S1, S2. No MRG. Regular rate and rhythm. No peripheral edema, cyanosis or pallor.  Gastrointestinal:  Soft, nondistended, nontender. No rebound or guarding. Normal bowel sounds. No appreciable masses or hepatomegaly. Rectal:  Not performed.  Psychiatric: Oriented to person, place and time. Demonstrates good judgement and reason without abnormal affect or behaviors.  RELEVANT LABS AND IMAGING: CBC    Component Value Date/Time   WBC 4.8 11/20/2021 0949   RBC 5.13 (H) 11/20/2021 0949   HGB 12.8 11/20/2021 0949   HCT 40.9 11/20/2021 0949   PLT 230 11/20/2021 0949   MCV 79.7 (L) 11/20/2021 0949   MCH 25.0 (L) 11/20/2021 0949   MCHC 31.3 (L) 11/20/2021 0949   RDW 14.7 11/20/2021 0949   LYMPHSABS 960 11/20/2021 0949   MONOABS 294 01/14/2017 1136   EOSABS 48 11/20/2021 0949   BASOSABS 19 11/20/2021 0949    CMP     Component Value Date/Time   NA 142 05/28/2022 0956   NA 145 (H) 04/03/2022 1030   K 4.2 05/28/2022 0956   CL 104 05/28/2022 0956   CO2 23 05/28/2022 0956   GLUCOSE 112 (H) 05/28/2022 0956   BUN 19 05/28/2022 0956   BUN 24 04/03/2022 1030   CREATININE 0.86 05/28/2022 0956   CALCIUM 9.5 05/28/2022 0956   PROT 7.0 05/28/2022 0956   ALBUMIN 4.1 08/17/2020 1409   AST 16 05/28/2022 0956   ALT  10 05/28/2022 0956   ALKPHOS 89 08/17/2020 1409   BILITOT 0.8 05/28/2022 0956   GFRNONAA 49 (L) 11/16/2020 0958   GFRAA 57 (L) 11/16/2020 0958    Assessment: 1.  History of adenomatous polyps: Last colonoscopy in August 2019 with repeat recommended in 5 years, this would make it August 2024, patient is not quite due yet  Plan: 1.  Discussed with patient that if she is still in good health next year then she would be due for repeat colonoscopy for her history of adenomatous polyps.  She verbalized understanding.  In lieu of Dr. Ardis Hughs absence patient wished to be placed with Dr. Carlean Purl next year. 2.  Martin Majestic ahead and put patient in recall for an appointment in June 2024 to rediscuss her surveillance colonoscopy at that time so that we can make sure she does not have any large health changes before then.  She verbalized understanding. 3.  Patient to follow in clinic with [place] as above.  Lisa Newer, PA-C [facility] 11/11/2022, 3:01 PM  Cc: Elby Showers, MD

## 2022-11-18 ENCOUNTER — Other Ambulatory Visit (HOSPITAL_BASED_OUTPATIENT_CLINIC_OR_DEPARTMENT_OTHER): Payer: Self-pay

## 2022-11-18 MED ORDER — AREXVY 120 MCG/0.5ML IM SUSR
INTRAMUSCULAR | 0 refills | Status: DC
  Filled 2022-11-18: qty 0.5, 1d supply, fill #0

## 2022-12-10 DIAGNOSIS — H40033 Anatomical narrow angle, bilateral: Secondary | ICD-10-CM | POA: Diagnosis not present

## 2022-12-10 DIAGNOSIS — H40013 Open angle with borderline findings, low risk, bilateral: Secondary | ICD-10-CM | POA: Diagnosis not present

## 2022-12-10 DIAGNOSIS — H2513 Age-related nuclear cataract, bilateral: Secondary | ICD-10-CM | POA: Diagnosis not present

## 2022-12-10 DIAGNOSIS — H1045 Other chronic allergic conjunctivitis: Secondary | ICD-10-CM | POA: Diagnosis not present

## 2022-12-11 ENCOUNTER — Encounter: Payer: Self-pay | Admitting: Internal Medicine

## 2022-12-12 ENCOUNTER — Ambulatory Visit (INDEPENDENT_AMBULATORY_CARE_PROVIDER_SITE_OTHER): Payer: Medicare Other | Admitting: Podiatry

## 2022-12-12 DIAGNOSIS — M79674 Pain in right toe(s): Secondary | ICD-10-CM

## 2022-12-12 DIAGNOSIS — M79675 Pain in left toe(s): Secondary | ICD-10-CM

## 2022-12-12 DIAGNOSIS — B353 Tinea pedis: Secondary | ICD-10-CM | POA: Diagnosis not present

## 2022-12-12 DIAGNOSIS — L84 Corns and callosities: Secondary | ICD-10-CM

## 2022-12-12 DIAGNOSIS — B351 Tinea unguium: Secondary | ICD-10-CM

## 2022-12-12 MED ORDER — KETOCONAZOLE 2 % EX CREA: 1.0000 | TOPICAL_CREAM | Freq: Every day | CUTANEOUS | 0 refills | Status: DC

## 2022-12-12 NOTE — Progress Notes (Signed)
  Subjective:  [name], female    DOB: Nov 19, 1941,  MRN: [id number]  Chief Complaint  Patient presents with   foot care    RFC, hardened skin on the right 5th digit, possible fungus on the left foot, heel    81 y.o. female presents with the above complaint. History confirmed with patient. Patient presenting with pain related to dystrophic thickened elongated nails. Patient is unable to trim own nails related to nail dystrophy and/or mobility issues. Patient does not have a history of T2DM. Patient does have callus present located at the dorsolateral aspect of the right foot 5th PIPJ causing pain. Also concerned about possible athletes foot fungal infection, requesting antifungal medication.   Objective:  Physical Exam: warm, good capillary refill nail exam onychomycosis of the toenails, onycholysis, and dystrophic nails Mild xerosis of the plantar foot, discoloration medial ankle left side concerning for possible tinea pedis.  DP pulses palpable, PT pulses palpable, and protective sensation intact Left Foot:  Pain with palpation of nails due to elongation and dystrophic growth.  Right Foot: Pain with palpation of nails due to elongation and dystrophic growth. Hyperkeratotic lesion present dorsal lateral aspect fifth toe proximal interphalangeal joint related to adductovarus toe deformity pain with palpation of this area.  Assessment:   1. Pain due to onychomycosis of toenails of both feet   2. Corns and callosities   3. Tinea pedis of left foot      Plan:  Patient was evaluated and treated and all questions answered.  # tinea pedis left foot Discussed the etiology and treatment options for tinea pedis.  Discussed topical and oral treatment.  Recommended topical treatment with 2% ketoconazole cream.  This was sent to the patient's pharmacy.  Also discussed appropriate foot hygiene, use of antifungal spray such as Tinactin in shoes, as well as cleaning her foot  surfaces such as showers and bathroom floors with bleach.  #Hyperkeratotic lesions/pre ulcerative calluses present dorsolateral aspect right 5th toe All symptomatic hyperkeratoses x 1 separate lesions were safely debrided with a sterile #10 blade to patient's level of comfort without incident. We discussed preventative and palliative care of these lesions including supportive and accommodative shoegear, padding, prefabricated and custom molded accommodative orthoses, use of a pumice stone and lotions/creams daily.  #Onychomycosis with pain  -Nails palliatively debrided as below. -Educated on self-care  Procedure: Nail Debridement Rationale: Pain Type of Debridement: manual, sharp debridement. Instrumentation: Nail nipper, rotary burr. Number of Nails: 10  Return in about 3 months (around 03/13/2023) for RFC.         Everitt Amber, DPM [facility]

## 2022-12-16 ENCOUNTER — Encounter: Payer: Self-pay | Admitting: Internal Medicine

## 2022-12-16 ENCOUNTER — Telehealth (INDEPENDENT_AMBULATORY_CARE_PROVIDER_SITE_OTHER): Payer: Medicare Other | Admitting: Internal Medicine

## 2022-12-16 VITALS — Wt 179.0 lb

## 2022-12-16 DIAGNOSIS — U071 COVID-19: Secondary | ICD-10-CM

## 2022-12-16 DIAGNOSIS — E7800 Pure hypercholesterolemia, unspecified: Secondary | ICD-10-CM | POA: Diagnosis not present

## 2022-12-16 DIAGNOSIS — I10 Essential (primary) hypertension: Secondary | ICD-10-CM | POA: Diagnosis not present

## 2022-12-16 DIAGNOSIS — R7302 Impaired glucose tolerance (oral): Secondary | ICD-10-CM | POA: Diagnosis not present

## 2022-12-16 DIAGNOSIS — J4520 Mild intermittent asthma, uncomplicated: Secondary | ICD-10-CM

## 2022-12-16 DIAGNOSIS — F411 Generalized anxiety disorder: Secondary | ICD-10-CM | POA: Diagnosis not present

## 2022-12-16 MED ORDER — NIRMATRELVIR/RITONAVIR (PAXLOVID)TABLET: 3.0000 | ORAL_TABLET | Freq: Two times a day (BID) | ORAL | 0 refills | Status: AC

## 2022-12-16 MED ORDER — BENZONATATE 100 MG PO CAPS: 100.0000 mg | ORAL_CAPSULE | Freq: Three times a day (TID) | ORAL | 0 refills | Status: DC | PRN

## 2022-12-16 NOTE — Patient Instructions (Signed)
Patient is to quarantine at home for 5 days.  She is to stay well-hydrated and walk some around her home to prevent atelectasis.  Recommend monitoring pulse oximetry with home oxygen monitor which can be obtained at pharmacy.  Take regular strength Paxlovid as directed and Tessalon Perles as needed for cough.  Call if symptoms worsen or not improving within 24 to 48 hours.

## 2022-12-16 NOTE — Progress Notes (Signed)
   Subjective:    [name], female    DOB: 09-Nov-1941, 81 y.o.   MRN: [id number]  HPI 81 year old Black Female with history of essential hypertension, mild glucose intolerance, pure hypercholesterolemia, BMI 30.25, musculoskeletal pain, dependent edema, allergic rhinitis follow-up today with a positive COVID test.  A relative came to her home to fix something inside the home and apparently has come down with COVID as well.  Her sister resides with her and also has COVID.  Patient has malaise and fatigue.  She has some mild respiratory congestion.  Says her throat hurts.  Says she has been coughing for several days and that her throat is sore today.  Denies fever.  Has malaise and fatigue.  She was vaccinated for COVID-19 again on 12/07/2022 and also received a flu vaccine 10/25/2022.  Patient is seen virtually today.  She is at her home and I am at my office.  She is identified using 2 identifiers as Lisa [name], a patient in this practice.  She is agreeable to visit in this format.   Review of Systems no nausea, vomiting, diarrhea, respiratory distress, chest pain, shortness of breath     Objective:   Physical Exam She is seen virtually and is able to give a clear concise history.  Does not sound tachypneic.  Not heard to be coughing at the present time.       Assessment & Plan:  Acute COVID-19 virus infection-currently vaccinated.  Had COVID-19 in July 2022.  Essential hypertension-treated with Diovan and Lasix  Dependent edema-treated with Lasix  Mild glucose intolerance  Hyperlipidemia  History of reactive airways disease treated with albuterol and Astelin as well as Allegra and Flonase and Flovent  Plan: We discussed treatment options such as supportive treatment versus Paxlovid.  Patient is agreeable to taking Paxlovid.  Her estimated GFR was checked from labs done in May 2023 and his 69 cc/min.  Regular strength Paxlovid will be called in for her.   She should quarantine at home for 5 days.  She should stay well-hydrated and monitor pulse oximetry.  She is to call if symptoms worsen.

## 2022-12-17 ENCOUNTER — Other Ambulatory Visit: Payer: Medicare Other

## 2022-12-17 DIAGNOSIS — E041 Nontoxic single thyroid nodule: Secondary | ICD-10-CM

## 2022-12-17 DIAGNOSIS — R7302 Impaired glucose tolerance (oral): Secondary | ICD-10-CM

## 2022-12-17 DIAGNOSIS — R7989 Other specified abnormal findings of blood chemistry: Secondary | ICD-10-CM

## 2022-12-17 DIAGNOSIS — R5383 Other fatigue: Secondary | ICD-10-CM

## 2022-12-17 DIAGNOSIS — I10 Essential (primary) hypertension: Secondary | ICD-10-CM

## 2022-12-17 DIAGNOSIS — E8881 Metabolic syndrome: Secondary | ICD-10-CM

## 2022-12-17 DIAGNOSIS — R000 Tachycardia, unspecified: Secondary | ICD-10-CM

## 2022-12-17 DIAGNOSIS — E7800 Pure hypercholesterolemia, unspecified: Secondary | ICD-10-CM

## 2022-12-19 ENCOUNTER — Ambulatory Visit (INDEPENDENT_AMBULATORY_CARE_PROVIDER_SITE_OTHER): Payer: Medicare Other

## 2022-12-19 VITALS — Wt 178.0 lb

## 2022-12-19 DIAGNOSIS — Z0000 Encounter for general adult medical examination without abnormal findings: Secondary | ICD-10-CM

## 2022-12-19 NOTE — Progress Notes (Signed)
Subjective:   Lisa [name] is a 81 y.o. female who presents for Medicare Annual (Subsequent) preventive examination.  Review of Systems    AWV       Objective:    There were no vitals filed for this visit. There is no height or weight on file to calculate BMI.     11/26/2021   11:16 AM 08/21/2020   12:15 PM 08/17/2020    1:38 PM  Advanced Directives  Does Patient Have a Medical Advance Directive? Yes Yes Yes  Type of Paramedic of [place];Living will Healthcare Power of [place]  Does patient want to make changes to medical advance directive? No - Patient declined No - Patient declined   Copy of [place] in Chart? No - copy requested  No - copy requested    Current Medications (verified) Outpatient Encounter Medications as of 12/19/2022  Medication Sig   acetaminophen (TYLENOL) 500 MG tablet Take 500-1,000 mg by mouth every 6 (six) hours as needed for mild pain or moderate pain.   albuterol (VENTOLIN HFA) 108 (90 Base) MCG/ACT inhaler Inhale 2 puffs into the lungs every 6 (six) hours as needed for wheezing or shortness of breath.   Azelastine HCl 137 MCG/SPRAY SOLN 1 spray ea nostril   benzonatate (TESSALON) 100 MG capsule Take 1 capsule (100 mg total) by mouth 3 (three) times daily as needed for cough.   fexofenadine (ALLEGRA) 180 MG tablet 1 tablet   fluticasone (FLONASE) 50 MCG/ACT nasal spray Place 2 sprays into both nostrils daily as needed for allergies or rhinitis.   fluticasone (FLOVENT HFA) 110 MCG/ACT inhaler 2 puffs Inhalation Twice a day for 30 days   furosemide (LASIX) 20 MG tablet Take 1 tablet (20 mg total) by mouth 2 (two) times daily.   influenza vaccine adjuvanted (FLUAD QUADRIVALENT) 0.5 ML injection Inject into the muscle.   ketoconazole (NIZORAL) 2 % cream Apply 1 Application topically daily.   loratadine (CLARITIN) 10 MG tablet Take 10 mg by mouth daily.    nirmatrelvir/ritonavir EUA (PAXLOVID) 20 x 150 MG & 10 x 100MG TABS Take 3 tablets by mouth 2 (two) times daily for 5 days. (Take nirmatrelvir 150 mg two tablets twice daily for 5 days and ritonavir 100 mg one tablet twice daily for 5 days) Patient GFR is 69 cc/min   RSV vaccine recomb adjuvanted (AREXVY) 120 MCG/0.5ML injection Inject into the muscle.   Spacer/Aero-Holding Chambers DEVI 1 each by Does not apply route in the morning, at noon, in the evening, and at bedtime.   triamcinolone ointment (KENALOG) 0.1 % SMARTSIG:sparingly Topical Twice Daily PRN   valsartan (DIOVAN) 80 MG tablet Take 1 tablet (80 mg total) by mouth 2 (two) times daily.   No facility-administered encounter medications on file as of 12/19/2022.    Allergies (verified) Grass pollen([id number]-o-r-[id number]-swt vern), Penicillins, Pollen extract, Codeine, Darvon, Tall ragweed, and Iodinated i 131 albumin   History: Past Medical History:  Diagnosis Date   Allergy    Arthritis    Cancer ([place])    cervical   Complication of anesthesia    Difficulty waking up after hysterectomy in 1973   Goiter    Headache    Sinus headaches   Hyperlipidemia    Hypertension    Obesity    Osteopenia    Vitamin D deficiency    Past Surgical History:  Procedure Laterality Date   ABDOMINAL HYSTERECTOMY     EYE SURGERY  HAMMER TOE SURGERY  12/02   right   TOTAL KNEE ARTHROPLASTY Left 08/21/2020   Procedure: TOTAL KNEE ARTHROPLASTY;  Surgeon: Gaynelle Arabian, MD;  Location: [facility];  Service: Orthopedics;  Laterality: Left;  35mn   WISDOM TOOTH EXTRACTION     Family History  Problem Relation Age of Onset   Hypertension Mother    Heart disease Father    Hypertension Father    Thyroid disease Father    Social History   Socioeconomic History   Marital status: Widowed    Spouse name: Not on file   Number of children: Not on file   Years of education: Not on file   Highest education level: Not on file  Occupational History   Not on  file  Tobacco Use   Smoking status: Never   Smokeless tobacco: Never  Vaping Use   Vaping Use: Never used  Substance and Sexual Activity   Alcohol use: No    Comment: Wine once or twice a year   Drug use: No   Sexual activity: Not on file    Comment: Hysterectomy  Other Topics Concern   Not on file  Social History Narrative   Not on file   Social Determinants of Health   Financial Resource Strain: Not on file  Food Insecurity: Not on file  Transportation Needs: Not on file  Physical Activity: Not on file  Stress: Not on file  Social Connections: Not on file    Tobacco Counseling Counseling given: Not Answered   Clinical Intake:                 Diabetic?Yes          Activities of Daily Living     No data to display           Patient Care Team: [place], MCresenciano Lick MD as PCP - General (Internal Medicine) CBuford Dresser MD as PCP - Cardiology (Cardiology)  Indicate any recent Medical Services you may have received from other than [facility] providers in the past year (date may be approximate).     Assessment:   This is a routine wellness examination for Lisa [name]  Hearing/Vision screen No results found.  Dietary issues and exercise activities discussed:     Goals Addressed   None   Depression Screen    11/26/2021   11:17 AM 10/05/2021    3:01 PM 11/27/2020   10:00 PM 03/13/2020    2:50 PM 11/16/2019    2:12 PM 11/09/2018    3:11 PM 02/21/2014   10:43 PM  PHQ 2/9 Scores  PHQ - 2 Score 0 0 0 0 0 0 0  PHQ- 9 Score 0 0 1        Fall Risk    10/05/2021    2:53 PM 08/07/2020    3:48 PM 06/06/2020   12:12 PM 11/16/2019    2:12 PM 05/30/2019   10:43 AM  Fall Risk   Falls in the past year? 0 0 0 0   Number falls in past yr: 0 0 0    Injury with Fall? 0 0 0    Risk for fall due to : No Fall Risks  No Fall Risks  Impaired balance/gait  Follow up Falls evaluation completed Falls evaluation completed Falls evaluation completed      [place]  Any stairs in or around the home? Yes  If so, are there any without handrails? No  Home free  of loose throw rugs in walkways, pet beds, electrical cords, etc? Yes  Adequate lighting in your home to reduce risk of falls? Yes   ASSISTIVE DEVICES UTILIZED TO PREVENT FALLS:  Life alert? No  Use of a cane, walker or w/c? Yes  Grab bars in the bathroom? Yes  Shower chair or bench in shower? Yes  Elevated toilet seat or a handicapped toilet? Yes   TIMED UP AND GO:  Was the test performed?  N/A  .  Length of time to ambulate 10 feet: N/A sec.   Gait steady and fast with assistive device  Cognitive Function:        11/26/2021   11:19 AM  6CIT Screen  What Year? 0 points  What month? 0 points  What time? 0 points  Count back from 20 0 points  Months in reverse 0 points  Repeat phrase 0 points  Total Score 0 points    Immunizations Immunization History  Administered Date(s) Administered   COVID-19, mRNA, vaccine(Comirnaty)12 years and older 10/07/2022   Fluad Quad(high Dose 65+) 10/25/2022   Influenza, High Dose Seasonal PF 11/05/2016   Influenza,inj,Quad PF,6+ Mos 11/09/2013, 11/28/2015, 12/25/2017, 11/20/2018, 09/20/2019, 09/28/2020   Influenza-Unspecified 11/05/2021   Moderna Sars-Covid-2 Vaccination 02/27/2020, 03/19/2020   PFIZER Comirnaty(Gray Top)Covid-19 Tri-Sucrose Vaccine 04/20/2021   PFIZER(Purple Top)SARS-COV-2 Vaccination 01/19/2020, 02/09/2020, 10/14/2020   Pfizer Covid-19 Vaccine Bivalent Booster 60yr & up 11/13/2021   Pneumococcal Conjugate-13 12/07/2015   Pneumococcal Polysaccharide-23 01/15/2011   Respiratory Syncytial Virus Vaccine,Recomb Aduvanted(Arexvy) 11/18/2022   Tdap 08/12/1995, 03/18/2014   Zoster, Live 01/15/2011    TDAP status: Up to date  Flu Vaccine status: Up to date  Pneumococcal vaccine status: Due, Education has been provided regarding the importance of this vaccine. Advised may receive this  vaccine at local pharmacy or Health Dept. Aware to provide a copy of the vaccination record if obtained from local pharmacy or Health Dept. Verbalized acceptance and understanding.  Covid-19 vaccine status: Information provided on how to obtain vaccines.   Qualifies for Shingles Vaccine? Yes   Zostavax completed No   Shingrix Completed?: No.    Education has been provided regarding the importance of this vaccine. Patient has been advised to call insurance company to determine out of pocket expense if they have not yet received this vaccine. Advised may also receive vaccine at local pharmacy or Health Dept. Verbalized acceptance and understanding.  Screening Tests Health Maintenance  Topic Date Due   Hepatitis C Screening  Never done   Zoster Vaccines- Shingrix (1 of 2) Never done   DEXA SCAN  Never done   FOOT EXAM  05/13/2020   OPHTHALMOLOGY EXAM  05/31/2022   Diabetic kidney evaluation - Urine ACR  11/26/2022   COVID-19 Vaccine (9 - 2023-24 season) 12/02/2022   HEMOGLOBIN A1C  11/28/2022   Diabetic kidney evaluation - eGFR measurement  05/29/2023   COLONOSCOPY (Pts 45-449yrInsurance coverage will need to be confirmed)  08/05/2023   MAMMOGRAM  09/18/2023   Medicare Annual Wellness (AWV)  12/20/2023   DTaP/Tdap/Td (3 - Td or Tdap) 03/18/2024   Pneumonia Vaccine 6597Years old  Completed   INFLUENZA VACCINE  Completed   [place]PV [place]AP[place]ores[place] Due  Topic Date Due   Hepatitis C Screening  Never done   Zoster Vaccines- Shingrix (1 of 2) Never done   DEXA SCAN  Never done   FOOT EXAM  05/13/2020   OPHTHALMOLOGY EXAM  05/31/2022  Diabetic kidney evaluation - Urine ACR  11/26/2022   COVID-19 Vaccine (9 - 2023-24 season) 12/02/2022   HEMOGLOBIN A1C  11/28/2022    Colorectal cancer screening: Type of screening: Colonoscopy. Completed 08/04/18. Repeat every 5 years  Mammogram status: Completed 09/17/22. Repeat every year  Bone Density  status: Ordered TBD. Pt provided with contact info and advised to call to schedule appt.  Lung Cancer Screening: (Low Dose CT Chest recommended if Age 69-80 years, 30 pack-year currently smoking OR have quit w/in 15years.) does not qualify.   Lung Cancer Screening Referral: N/A  Additional Screening:  Hepatitis C Screening: does not qualify; Completed N/A  Vision Screening: Recommended annual ophthalmology exams for early detection of glaucoma and other disorders of the eye. Is the patient up to date with their annual eye exam?  Yes  Who is the provider or what is the name of the office in which the patient attends annual eye exams? [facility]  If pt is not established with a provider, would they like to be referred to a provider to establish care? No .   Dental Screening: Recommended annual dental exams for proper oral hygiene  Community Resource Referral / Chronic Care Management: CRR required this visit?  No   CCM required this visit?  No      Plan:     I have personally reviewed and noted the following in the patient's chart:   Medical and social history Use of alcohol, tobacco or illicit drugs  Current medications and supplements including opioid prescriptions. Patient is not currently taking opioid prescriptions. Functional ability and status Nutritional status Physical activity Advanced directives List of other physicians Hospitalizations, surgeries, and ER visits in previous 12 months Vitals Screenings to include cognitive, depression, and falls Referrals and appointments  In addition, I have reviewed and discussed with patient certain preventive protocols, quality metrics, and best practice recommendations. A written personalized care plan for preventive services as well as general preventive health recommendations were provided to patient.     Pearlean Brownie, [place]   12/19/2022   Nurse Notes:

## 2023-01-02 DIAGNOSIS — L304 Erythema intertrigo: Secondary | ICD-10-CM | POA: Insufficient documentation

## 2023-01-02 DIAGNOSIS — L821 Other seborrheic keratosis: Secondary | ICD-10-CM | POA: Insufficient documentation

## 2023-01-02 DIAGNOSIS — L280 Lichen simplex chronicus: Secondary | ICD-10-CM | POA: Insufficient documentation

## 2023-01-10 ENCOUNTER — Other Ambulatory Visit: Payer: Self-pay | Admitting: Internal Medicine

## 2023-01-10 DIAGNOSIS — R062 Wheezing: Secondary | ICD-10-CM

## 2023-01-10 DIAGNOSIS — R059 Cough, unspecified: Secondary | ICD-10-CM

## 2023-01-15 NOTE — Progress Notes (Signed)
Cardiology Office Note:    Date:  01/16/2023   ID:  Zonda, Sundin 1941-02-04, MRN [id number]  PCP:  Elby Showers, MD  Cardiologist:  Buford Dresser, MD  Referring MD: Elby Showers, MD   CC: follow up  History of Present Illness:    Lisa [name] is a 82 y.o. female with a hx of asthma, hyperlipidemia, hypertension, obesity, arthritis, cervical cancer, and goiter, who is seen for follow up today. I initially met her 10/23/21 as a new consult at the request of Baxley, Cresenciano Lick, MD for the evaluation and management of tachycardia.  Cardiovascular risk factors: Prior clinical ASCVD: None Comorbid conditions: hypertension, hyperlipidemia Metabolic syndrome/Obesity:  Obesity, Chronic inflammatory conditions: none Tobacco use history: Not a smoker (2-3 months in college) Alcohol consumption: Every now and then Family history: mother and father had hypertension, father had heart disease  At her last appointment, she had started a regimen for allergies and asthma. In the mornings: took [place], her inhaler, nose spray, and saline spray. In the evenings: used her inhaler, claritin, and azelastine. Blood pressure was elevated above normal in clinic, but improved on recheck. Home averages with blood pressure in the 140's/70s-80's. She complained of bilateral swelling to the ankles; was doing well with compression stockings and BID lasix. We also discussed SGLT2i, which she declined. Was on rosuvastatin 5 mg 3x/week previously, reported not taking at her visit with Dr. Renold Genta 12/18/21. Reassess at follow up.  Today, her sister is present for exam. She is feeling okay today. No chest heaviness or shortness of breath. Her headaches have resolved. In clinic her blood pressure is initially 176/82, which she would attribute to walking into the office today. Her blood pressure improved to 138/72 on recheck. She reports more well controlled readings at home and other clinic  visits.  She continues to have seasonal allergies, treated with medications. We reviewed these and discussed if they had potential affects with her blood pressure. Since her recent move she has not yet located her blood pressure cuff.  Additionally she complains of tinnitus with onset a couple weeks ago that has been intermittent mostly at nighttime. It does not occur every day. The severity seems to correlate with the amount of congestion she has. She denies recent issues with dizziness; she has been able to bend over without the dizziness.  Usually she takes her Lasix around 11 AM - 12 PM, and then again at dinnertime. She still has swelling present today but it has improved. She endorses adequate urine production.  Currently she takes a Dance movement psychotherapist multivitamin. She started this about 2 months ago and generally has been feeling better. She confirms developing myalgias when she was on rosuvastatin previously.  She denies any palpitations, syncope, orthopnea, or PND.   Past Medical History:  Diagnosis Date   Allergy    Arthritis    Cancer ([place])    cervical   Complication of anesthesia    Difficulty waking up after hysterectomy in 1973   Goiter    Headache    Sinus headaches   Hyperlipidemia    Hypertension    Obesity    Osteopenia    Vitamin D deficiency     Past Surgical History:  Procedure Laterality Date   ABDOMINAL HYSTERECTOMY     EYE SURGERY     HAMMER TOE SURGERY  12/02   right   TOTAL KNEE ARTHROPLASTY Left 08/21/2020   Procedure: TOTAL KNEE ARTHROPLASTY;  Surgeon: Gaynelle Arabian, MD;  Location: [facility];  Service: Orthopedics;  Laterality: Left;  1mn   WISDOM TOOTH EXTRACTION      Current Medications: Current Outpatient Medications on File Prior to Visit  Medication Sig   acetaminophen (TYLENOL) 500 MG tablet Take 500-1,000 mg by mouth every 6 (six) hours as needed for mild pain or moderate pain.   albuterol (VENTOLIN HFA) 108 (90 Base) MCG/ACT inhaler Inhale  2 puffs into the lungs every 6 (six) hours as needed for wheezing or shortness of breath.   Azelastine HCl 137 MCG/SPRAY SOLN 1 spray ea nostril   fexofenadine (ALLEGRA) 180 MG tablet 1 tablet   fluticasone (FLONASE) 50 MCG/ACT nasal spray Place 2 sprays into both nostrils daily as needed for allergies or rhinitis.   fluticasone (FLOVENT HFA) 110 MCG/ACT inhaler 2 puffs Inhalation Twice a day for 30 days   furosemide (LASIX) 20 MG tablet Take 1 tablet (20 mg total) by mouth 2 (two) times daily.   influenza vaccine adjuvanted (FLUAD QUADRIVALENT) 0.5 ML injection Inject into the muscle.   ketoconazole (NIZORAL) 2 % cream Apply 1 Application topically daily.   loratadine (CLARITIN) 10 MG tablet Take 10 mg by mouth daily.   RSV vaccine recomb adjuvanted (AREXVY) 120 MCG/0.5ML injection Inject into the muscle.   Spacer/Aero-Holding Chambers DEVI 1 each by Does not apply route in the morning, at noon, in the evening, and at bedtime.   triamcinolone ointment (KENALOG) 0.1 % SMARTSIG:sparingly Topical Twice Daily PRN   valsartan (DIOVAN) 80 MG tablet Take 1 tablet (80 mg total) by mouth 2 (two) times daily.   No current facility-administered medications on file prior to visit.     Allergies:   Grass pollen(k-o-r-t-swt vern), Penicillins, Pollen extract, Codeine, Darvon, Tall ragweed, and Iodinated i 131 albumin   Social History   Tobacco Use   Smoking status: Never   Smokeless tobacco: Never  Vaping Use   Vaping Use: Never used  Substance Use Topics   Alcohol use: No    Comment: Wine once or twice a year   Drug use: No    Family History: family history includes Heart disease in her father; Hypertension in her father and mother; Thyroid disease in her father.  ROS:   Please see the history of present illness. (+) Seasonal allergies (+) Congestion (+) Tinnitus (+) LE edema All other systems are reviewed and negative.     EKGs/Labs/Other Studies Reviewed:    The following studies  were reviewed today:  Echo 10/31/2021: Sonographer Comments: Image acquisition challenging due to patient body habitus.  IMPRESSIONS    1. Left ventricular ejection fraction, by estimation, is 60 to 65%. The  left ventricle has normal function. The left ventricle has no regional  wall motion abnormalities. Left ventricular diastolic parameters are  consistent with Grade II diastolic  dysfunction (pseudonormalization). Elevated left atrial pressure.   2. Right ventricular systolic function is normal. The right ventricular  size is normal.   3. Suspect that there is probably moderate eccentric mitral  insufficiency, but this is poorly visualized. The mitral valve is grossly  normal. No evidence of mitral valve regurgitation. No evidence of mitral  stenosis.   4. The aortic valve is normal in structure. Aortic valve regurgitation is  trivial. Mild to moderate aortic valve sclerosis/calcification is present,  without any evidence of aortic stenosis.   5. The inferior vena cava is normal in size with greater than 50%  respiratory variability, suggesting right atrial pressure of 3 mmHg.   Comparison(s):  No prior Echocardiogram.   Conclusion(s)/Recommendation(s): If physical findings suggest more than  moderate MR, consider TEE for better evaluation.  LE Venous DVT 08/16/2021: Summary:  RIGHT:  - There is no evidence of deep vein thrombosis in the lower extremity.     - No cystic structure found in the popliteal fossa.     LEFT:  - No evidence of common femoral vein obstruction.   EKG:  EKG is personally reviewed 01/16/2023:  sinus tachycardia at 125 bpm 07/05/2022: sinus tachycardia at 107 bpm 01/23/2022: sinus tachycardia at 114 bpm 10/23/2021: NSR at 99 bpm, nonspecific ST pattern  Recent Labs: 05/28/2022: ALT 10; BUN 19; Creat 0.86; Potassium 4.2; Sodium 142   Recent Lipid Panel    Component Value Date/Time   CHOL 190 05/28/2022 0956   TRIG 40 05/28/2022 0956   HDL 72  05/28/2022 0956   CHOLHDL 2.6 05/28/2022 0956   VLDL 11 01/14/2017 1136   LDLCALC 106 (H) 05/28/2022 0956    Physical Exam:    VS:  BP 138/72 (BP Location: Left Arm, Patient Position: Sitting, Cuff Size: Large)   Pulse (!) 125   Ht 5' 3"$  (1.6 m)   Wt 181 lb 6.4 oz (82.3 kg)   BMI 32.13 kg/m     Wt Readings from Last 3 Encounters:  01/16/23 181 lb 6.4 oz (82.3 kg)  12/19/22 178 lb (80.7 kg)  12/16/22 179 lb (81.2 kg)    GEN: Well nourished, well developed in no acute distress HEENT: Normal, moist mucous membranes NECK: No JVD CARDIAC: regular rhythm, normal S1 and S2, no rubs or gallops. No murmur. VASCULAR: Radial and DP pulses 2+ bilaterally. No carotid bruits RESPIRATORY:  Clear to auscultation without rales, wheezing or rhonchi  ABDOMEN: Soft, non-tender, non-distended MUSCULOSKELETAL:  Ambulates independently with walker SKIN: Warm and dry, compression stockings in place. Pitting edema bilaterally to just above ankle NEUROLOGIC:  Alert and oriented x 3. No focal neuro deficits noted. PSYCHIATRIC:  Normal affect    ASSESSMENT:    1. Primary hypertension   2. Chronic diastolic heart failure ([place])   3. Sinus tachycardia   4. At increased risk for cardiovascular disease   5. Pure hypercholesterolemia   6. Bilateral leg edema   7. Counseling on health promotion and disease prevention     PLAN:    Bilateral LE edema: -stable on BID lasix, continue -suspect components of both chronic diastolic heart failure and chronic venous insufficiency -echo without high risk findings -reviewed instructions on daily weights, salt avoidance, etc -encouraged activity as tolerated -discussed SGLT2i, declines at this time -discussed compression, elevation. Doing well with compression stockings.  Hypertension -recheck improved -continue valsartan 80 mg BID, furosemide 20 mg bid -was on amlodipine prior, did not significantly change swelling, if BP remains elevated could consider  adding back  Hypercholesterolemia -was on rosuvastatin 5 mg 3x/week previously, did not tolerate -discussed options. She would be willing to try a non-statin agent. Will trial ezetimibe. -she has a very elevated ASCVD risk score  Sinus tachycardia -stable on ECG. HR improves with resting. Increase activity as tolerated.  Cardiac risk counseling and prevention recommendations: -recommend heart healthy/Mediterranean diet, with whole grains, fruits, vegetable, fish, lean meats, nuts, and olive oil. Limit salt. -recommend moderate walking, 3-5 times/week for 30-50 minutes each session. Aim for at least 150 minutes.week. Goal should be pace of 3 miles/hours, or walking 1.5 miles in 30 minutes -recommend avoidance of tobacco products. Avoid excess alcohol. -ASCVD risk score: The 10-year ASCVD  risk score (Arnett DK, et al., 2019) is: 45.2%   Values used to calculate the score:     Age: 59 years     Sex: Female     Is Non-Hispanic African American: Yes     Diabetic: Yes     Tobacco smoker: No     Systolic Blood Pressure: 0000000 mmHg     Is BP treated: Yes     HDL Cholesterol: 72 mg/dL     Total Cholesterol: 190 mg/dL    Plan for follow up: 3 months or sooner as needed.  Buford Dresser, MD, PhD, [facility]    Medication Adjustments/Labs and Tests Ordered: Current medicines are reviewed at length with the patient today.  Concerns regarding medicines are outlined above.   Orders Placed This Encounter  Procedures   EKG 12-Lead   Meds ordered this encounter  Medications   ezetimibe (ZETIA) 10 MG tablet    Sig: Take 1 tablet (10 mg total) by mouth daily.    Dispense:  90 tablet    Refill:  3   Patient Instructions  Medication Instructions:  START EZETIMIBE 10 MG DAILY   *If you need a refill on your cardiac medications before your next appointment, please call your pharmacy*  Lab Work: NONE  Testing/Procedures: NONE  Follow-Up: At [facility], you and your health needs are our priority.  As part of our continuing mission to provide you with exceptional heart care, we have created designated Provider Care Teams.  These Care Teams include your primary Cardiologist (physician) and Advanced Practice Providers (APPs -  Physician Assistants and Nurse Practitioners) who all work together to provide you with the care you need, when you need it.  We recommend signing up for the patient portal called "MyChart".  Sign up information is provided on this After Visit Summary.  MyChart is used to connect with patients for Virtual Visits (Telemedicine).  Patients are able to view lab/test results, encounter notes, upcoming appointments, etc.  Non-urgent messages can be sent to your provider as well.   To learn more about what you can do with MyChart, go to [email or web address].    Your next appointment:   3 month(s)  Provider:   Buford Dresser, MD         [facility] [place],acting as a scribe for Buford Dresser, MD.,have documented all relevant documentation on the behalf of Buford Dresser, MD,as directed by  Buford Dresser, MD while in the presence of Buford Dresser, MD.  I, Buford Dresser, MD, have reviewed all documentation for this visit. The documentation on 01/16/23 for the exam, diagnosis, procedures, and orders are all accurate and complete.   Signed, Buford Dresser, MD PhD 01/16/2023     [place]

## 2023-01-16 ENCOUNTER — Encounter (HOSPITAL_BASED_OUTPATIENT_CLINIC_OR_DEPARTMENT_OTHER): Payer: Self-pay | Admitting: Cardiology

## 2023-01-16 ENCOUNTER — Ambulatory Visit (INDEPENDENT_AMBULATORY_CARE_PROVIDER_SITE_OTHER): Payer: Medicare Other | Admitting: Cardiology

## 2023-01-16 VITALS — BP 138/72 | HR 125 | Ht 63.0 in | Wt 181.4 lb

## 2023-01-16 DIAGNOSIS — I10 Essential (primary) hypertension: Secondary | ICD-10-CM

## 2023-01-16 DIAGNOSIS — R000 Tachycardia, unspecified: Secondary | ICD-10-CM

## 2023-01-16 DIAGNOSIS — R600 Localized edema: Secondary | ICD-10-CM

## 2023-01-16 DIAGNOSIS — Z9189 Other specified personal risk factors, not elsewhere classified: Secondary | ICD-10-CM | POA: Diagnosis not present

## 2023-01-16 DIAGNOSIS — E7800 Pure hypercholesterolemia, unspecified: Secondary | ICD-10-CM | POA: Diagnosis not present

## 2023-01-16 DIAGNOSIS — I5032 Chronic diastolic (congestive) heart failure: Secondary | ICD-10-CM

## 2023-01-16 DIAGNOSIS — Z7189 Other specified counseling: Secondary | ICD-10-CM | POA: Diagnosis not present

## 2023-01-16 MED ORDER — EZETIMIBE 10 MG PO TABS: 10.0000 mg | ORAL_TABLET | Freq: Every day | ORAL | 3 refills | Status: DC

## 2023-01-16 NOTE — Patient Instructions (Addendum)
Medication Instructions:  START EZETIMIBE 10 MG DAILY   *If you need a refill on your cardiac medications before your next appointment, please call your pharmacy*  Lab Work: NONE  Testing/Procedures: NONE  Follow-Up: At [facility], you and your health needs are our priority.  As part of our continuing mission to provide you with exceptional heart care, we have created designated Provider Care Teams.  These Care Teams include your primary Cardiologist (physician) and Advanced Practice Providers (APPs -  Physician Assistants and Nurse Practitioners) who all work together to provide you with the care you need, when you need it.  We recommend signing up for the patient portal called "MyChart".  Sign up information is provided on this After Visit Summary.  MyChart is used to connect with patients for Virtual Visits (Telemedicine).  Patients are able to view lab/test results, encounter notes, upcoming appointments, etc.  Non-urgent messages can be sent to your provider as well.   To learn more about what you can do with MyChart, go to [email or web address].    Your next appointment:   3 month(s)  Provider:   Buford Dresser, MD

## 2023-01-19 ENCOUNTER — Encounter (HOSPITAL_BASED_OUTPATIENT_CLINIC_OR_DEPARTMENT_OTHER): Payer: Self-pay | Admitting: Cardiology

## 2023-01-29 DIAGNOSIS — H5203 Hypermetropia, bilateral: Secondary | ICD-10-CM | POA: Diagnosis not present

## 2023-01-29 DIAGNOSIS — H2513 Age-related nuclear cataract, bilateral: Secondary | ICD-10-CM | POA: Diagnosis not present

## 2023-02-05 ENCOUNTER — Telehealth: Payer: Self-pay | Admitting: Cardiology

## 2023-02-05 NOTE — Telephone Encounter (Signed)
Pt c/o medication issue:  1. Name of Medication: valsartan (DIOVAN) 80 MG tablet   2. How are you currently taking this medication (dosage and times per day)?   3. Are you having a reaction (difficulty breathing--STAT)?   4. What is your medication issue? Pt would like some advice on when is the best time for her to take these medications. Please advise.

## 2023-02-05 NOTE — Telephone Encounter (Signed)
Spoke with patient and she has been taking the Valsartan twice day for a while but had never been told how to take  Advised patient to continue taking twice a day and try to do 12 hours apart, patient verbalized understanding

## 2023-02-12 ENCOUNTER — Telehealth (HOSPITAL_BASED_OUTPATIENT_CLINIC_OR_DEPARTMENT_OTHER): Payer: Self-pay | Admitting: Cardiology

## 2023-02-12 NOTE — Telephone Encounter (Signed)
Returned call to patient, provided instructions on how to check her BP correctly.  Pt is going to take her second Valsartan of the day 1 hr before bedtime, check her bp 1 hr after. Pt verbalized understanding of all instructions as given.  She will check her Bp twice daily 1 hr after medications for one week and send those Bps to our office. Georgana Curio MHA RN CCM

## 2023-02-12 NOTE — Telephone Encounter (Signed)
On yesterday her bp was                       165/95 hr 98 right arm                       180/100 hr 100 left arm.  After medication of 7pm yesterday                     139/74 hr 99 right arm.  Today 2pm 164/97 hr 99 right arm.   Patient is concern bout her bp raising overnight while she is sleeping. Please advise

## 2023-02-17 ENCOUNTER — Encounter (HOSPITAL_BASED_OUTPATIENT_CLINIC_OR_DEPARTMENT_OTHER): Payer: Self-pay | Admitting: Cardiology

## 2023-02-27 ENCOUNTER — Ambulatory Visit (INDEPENDENT_AMBULATORY_CARE_PROVIDER_SITE_OTHER): Payer: Medicare Other | Admitting: Podiatry

## 2023-02-27 DIAGNOSIS — B353 Tinea pedis: Secondary | ICD-10-CM

## 2023-02-27 DIAGNOSIS — M79675 Pain in left toe(s): Secondary | ICD-10-CM | POA: Diagnosis not present

## 2023-02-27 DIAGNOSIS — M79674 Pain in right toe(s): Secondary | ICD-10-CM | POA: Diagnosis not present

## 2023-02-27 DIAGNOSIS — L84 Corns and callosities: Secondary | ICD-10-CM

## 2023-02-27 DIAGNOSIS — B351 Tinea unguium: Secondary | ICD-10-CM | POA: Diagnosis not present

## 2023-02-27 NOTE — Progress Notes (Signed)
  Subjective:  [name], female    DOB: 18-Nov-1941,  MRN: [id number]  Chief Complaint  Patient presents with   Nail Problem    RFC    82 y.o. female presents with the above complaint. History confirmed with patient. Patient presenting with pain related to dystrophic thickened elongated nails. Patient is unable to trim own nails related to nail dystrophy and/or mobility issues. Patient does not have a history of T2DM. Patient does have callus present located at the dorsolateral aspect of the right foot 5th PIPJ causing pain.   Objective:  Physical Exam: warm, good capillary refill nail exam onychomycosis of the toenails, onycholysis, and dystrophic nails Mild xerosis of the plantar foot, discoloration medial ankle left side concerning for possible tinea pedis.  DP pulses palpable, PT pulses palpable, and protective sensation intact Left Foot:  Pain with palpation of nails due to elongation and dystrophic growth.  Right Foot: Pain with palpation of nails due to elongation and dystrophic growth. Hyperkeratotic lesion present dorsal lateral aspect fifth toe proximal interphalangeal joint related to adductovarus toe deformity pain with palpation of this area.  Assessment:   1. Pain due to onychomycosis of toenails of both feet   2. Corns and callosities   3. Tinea pedis of left foot       Plan:  Patient was evaluated and treated and all questions answered.  #Hyperkeratotic lesions/pre ulcerative calluses present dorsolateral aspect right 5th toe All symptomatic hyperkeratoses x 1 separate lesions were safely debrided with a sterile #10 blade to patient's level of comfort without incident. We discussed preventative and palliative care of these lesions including supportive and accommodative shoegear, padding, prefabricated and custom molded accommodative orthoses, use of a pumice stone and lotions/creams daily.  #Onychomycosis with pain  -Nails palliatively debrided  as below. -Educated on self-care  Procedure: Nail Debridement Rationale: Pain Type of Debridement: manual, sharp debridement. Instrumentation: Nail nipper, rotary burr. Number of Nails: 10  Return in about 3 months (around 05/28/2023) for RFC.         Everitt Amber, DPM [facility]

## 2023-03-04 ENCOUNTER — Other Ambulatory Visit (HOSPITAL_BASED_OUTPATIENT_CLINIC_OR_DEPARTMENT_OTHER): Payer: Self-pay | Admitting: Cardiology

## 2023-03-04 DIAGNOSIS — I10 Essential (primary) hypertension: Secondary | ICD-10-CM

## 2023-03-04 NOTE — Telephone Encounter (Signed)
Rx request sent to pharmacy.  

## 2023-04-15 NOTE — Progress Notes (Signed)
Cardiology Office Note:    Date:  04/16/2023   ID:  Lisa, [name] 02/12/1941, MRN [id number]  PCP:  Margaree Mackintosh, MD  Cardiologist:  Jodelle Red, MD  Referring MD: Margaree Mackintosh, MD   CC: follow up  History of Present Illness:    Lisa [name] is a 82 y.o. female with a hx of asthma, hyperlipidemia, hypertension, obesity, arthritis, cervical cancer, and goiter, who is seen for follow up today. I initially met her 10/23/21 as a new consult at the request of Lisa, Luanna Cole, MD for the evaluation and management of tachycardia.  Cardiovascular risk factors: Prior clinical ASCVD: None Comorbid conditions: hypertension, hyperlipidemia Metabolic syndrome/Obesity:  Obesity, Chronic inflammatory conditions: none Tobacco use history: Not a smoker (2-3 months in college) Alcohol consumption: Every now and then Family history: mother and father had hypertension, father had heart disease  At a prior appointment, she had started a regimen for allergies and asthma. In the mornings: took Allegra, her inhaler, nose spray, and saline spray. In the evenings: used her inhaler, claritin, and azelastine. Blood pressure was elevated above normal in clinic, but improved on recheck. Home averages with blood pressure in the 140's/70s-80's. She complained of bilateral swelling to the ankles; was doing well with compression stockings and BID lasix. We also discussed SGLT2i, which she declined. Was on rosuvastatin 5 mg 3x/week previously, reported not taking at her visit with Dr. Lenord Fellers 12/18/21. Reassess at follow up.  At her last appointment on 01/16/2023, her sister was present for exam. She was feeling okay during the visit. No chest heaviness or shortness of breath. Her headaches had resolved. In clinic her blood pressure was initially 176/82, which she attributed to walking into the office today. Her blood pressure improved to 138/72 on recheck. She reported more well controlled  readings at home and other clinic visits.  She continued to have seasonal allergies, treated with medications. We reviewed these and discussed if they had potential affects with her blood pressure. Since her move she had not yet located her blood pressure cuff.  Additionally she complained of tinnitus with onset a couple weeks prior that had been intermittent mostly at nighttime. It did not occur every day. The severity seemed to correlate with the amount of congestion she had. She denied recent issues with dizziness; she had been able to bend over without the dizziness.  Usually she took her Lasix around 11 AM - 12 PM, and then again at dinnertime. She still had swelling present during this visit but it had improved. She endorsed adequate urine production.  She took Centrum Silver multivitamins. She started this about 2 months prior and generally had been feeling better. She confirmed developing myalgias when she was on rosuvastatin previously.  Today, her blood pressure was elevated during initial check, at 164/86. When rechecked it slightly decreased to 156/82. She attributes multiple life stressors that have contributed to her blood pressure being elevated. She has associated anxiety at nighttime and has been struggling to sleep well. Her blood pressure was previously well controlled. She states she took her antihypertensives this morning at 9:30 am.    She continues to have seasonal allergies, which she treats with Allegra, Claritin, and 2 prescribed nasal drops. She reports persistent sinus pressure.   She endorses bilateral LE peripheral edema but notes it is not worse than usual.   She denies any palpitations, chest pain, or shortness of breath. No lightheadedness, headaches, syncope, orthopnea, or PND.  Past Medical  History:  Diagnosis Date   Allergy    Arthritis    Cancer    cervical   Complication of anesthesia    Difficulty waking up after hysterectomy in 1973   Goiter     Headache    Sinus headaches   Hyperlipidemia    Hypertension    Obesity    Osteopenia    Vitamin D deficiency     Past Surgical History:  Procedure Laterality Date   ABDOMINAL HYSTERECTOMY     EYE SURGERY     HAMMER TOE SURGERY  12/02   right   TOTAL KNEE ARTHROPLASTY Left 08/21/2020   Procedure: TOTAL KNEE ARTHROPLASTY;  Surgeon: Ollen Gross, MD;  Location: [facility];  Service: Orthopedics;  Laterality: Left;    WISDOM TOOTH EXTRACTION      Current Medications: Current Outpatient Medications on File Prior to Visit  Medication Sig   acetaminophen (TYLENOL) 500 MG tablet Take 500-1,000 mg by mouth every 6 (six) hours as needed for mild pain or moderate pain.   albuterol (VENTOLIN HFA) 108 (90 Base) MCG/ACT inhaler Inhale 2 puffs into the lungs every 6 (six) hours as needed for wheezing or shortness of breath.   Azelastine HCl 137 MCG/SPRAY SOLN 1 spray ea nostril   ezetimibe (ZETIA) 10 MG tablet Take 1 tablet (10 mg total) by mouth daily.   fexofenadine (ALLEGRA) 180 MG tablet 1 tablet   fluticasone (FLONASE) 50 MCG/ACT nasal spray Place 2 sprays into both nostrils daily as needed for allergies or rhinitis.   fluticasone (FLOVENT HFA) 110 MCG/ACT inhaler 2 puffs Inhalation Twice a day for 30 days   furosemide (LASIX) 20 MG tablet Take 1 tablet (20 mg total) by mouth 2 (two) times daily.   influenza vaccine adjuvanted (FLUAD QUADRIVALENT) 0.5 ML injection Inject into the muscle.   ketoconazole (NIZORAL) 2 % cream Apply 1 Application topically daily.   loratadine (CLARITIN) 10 MG tablet Take 10 mg by mouth daily.   RSV vaccine recomb adjuvanted (AREXVY) 120 MCG/0.5ML injection Inject into the muscle.   Spacer/Aero-Holding Chambers DEVI 1 each by Does not apply route in the morning, at noon, in the evening, and at bedtime.   triamcinolone ointment (KENALOG) 0.1 % SMARTSIG:sparingly Topical Twice Daily PRN   valsartan (DIOVAN) 80 MG tablet TAKE ONE TABLET BY MOUTH TWICE DAILY    No current facility-administered medications on file prior to visit.     Allergies:   Grass pollen([id number]-o-r-t-swt vern), Penicillins, Pollen extract, Codeine, Darvon, Tall ragweed, and Iodinated i 131 albumin   Social History   Tobacco Use   Smoking status: Never   Smokeless tobacco: Never  Vaping Use   Vaping Use: Never used  Substance Use Topics   Alcohol use: No    Comment: Wine once or twice a year   Drug use: No    Family History: family history includes Heart disease in her father; Hypertension in her father and mother; Thyroid disease in her father.  ROS:   Please see the history of present illness. (+) BLE peripheral edema (+) Insomnia -- stress related (+) Sinus pressure -- attributed to seasonal allergies All other systems are reviewed and negative.     EKGs/Labs/Other Studies Reviewed:    The following studies were reviewed today:  Echo 10/31/2021: Sonographer Comments: Image acquisition challenging due to patient body habitus.  IMPRESSIONS   1. Left ventricular ejection fraction, by estimation, is 60 to 65%. The  left ventricle has normal function. The left ventricle has  no regional  wall motion abnormalities. Left ventricular diastolic parameters are  consistent with Grade II diastolic  dysfunction (pseudonormalization). Elevated left atrial pressure.   2. Right ventricular systolic function is normal. The right ventricular  size is normal.   3. Suspect that there is probably moderate eccentric mitral  insufficiency, but this is poorly visualized. The mitral valve is grossly  normal. No evidence of mitral valve regurgitation. No evidence of mitral  stenosis.   4. The aortic valve is normal in structure. Aortic valve regurgitation is  trivial. Mild to moderate aortic valve sclerosis/calcification is present,  without any evidence of aortic stenosis.   5. The inferior vena cava is normal in size with greater than 50%  respiratory variability, suggesting  right atrial pressure of 3 mmHg.   Comparison(s): No prior Echocardiogram.   Conclusion(s)/Recommendation(s): If physical findings suggest more than  moderate MR, consider TEE for better evaluation.  LE Venous DVT 08/16/2021: Summary:  RIGHT:  - There is no evidence of deep vein thrombosis in the lower extremity.  - No cystic structure found in the popliteal fossa.  LEFT:  - No evidence of common femoral vein obstruction.   EKG:  EKG is personally reviewed 04/16/2023: Sinus tachycardia at 113 bpm 01/16/2023:  sinus tachycardia at 125 bpm 07/05/2022: sinus tachycardia at 107 bpm 01/23/2022: sinus tachycardia at 114 bpm 10/23/2021: NSR at 99 bpm, nonspecific ST pattern  Recent Labs: 05/28/2022: ALT 10; BUN 19; Creat 0.86; Potassium 4.2; Sodium 142   Recent Lipid Panel    Component Value Date/Time   CHOL 190 05/28/2022 0956   TRIG 40 05/28/2022 0956   HDL 72 05/28/2022 0956   CHOLHDL 2.6 05/28/2022 0956   VLDL 11 01/14/2017 1136   LDLCALC 106 (H) 05/28/2022 0956    Physical Exam:    VS:  BP (!) 156/82 (BP Location: Left Arm, Patient Position: Sitting)   Pulse (!) 113   Ht 5\' 3"  (1.6 m)   Wt 179 lb 6.4 oz (81.4 kg)   BMI 31.78 kg/m     Wt Readings from Last 3 Encounters:  04/16/23 179 lb 6.4 oz (81.4 kg)  01/16/23 181 lb 6.4 oz (82.3 kg)  12/19/22 178 lb (80.7 kg)    GEN: Well nourished, well developed in no acute distress HEENT: Normal, moist mucous membranes NECK: No JVD CARDIAC: regular rhythm, normal S1 and S2, no rubs or gallops. No murmur. VASCULAR: Radial and DP pulses 2+ bilaterally. No carotid bruits RESPIRATORY:  Clear to auscultation without rales, wheezing or rhonchi  ABDOMEN: Soft, non-tender, non-distended MUSCULOSKELETAL:  Ambulates independently with walker SKIN: Warm and dry, compression stockings in place. Pitting edema bilaterally to just above ankle NEUROLOGIC:  Alert and oriented x 3. No focal neuro deficits noted. PSYCHIATRIC:  Normal affect     ASSESSMENT:    1. Primary hypertension   2. Chronic diastolic heart failure   3. Sinus tachycardia   4. At increased risk for cardiovascular disease   5. Pure hypercholesterolemia   6. Bilateral leg edema    PLAN:    Bilateral LE edema: -stable on BID lasix, continue -suspect components of both chronic diastolic heart failure and chronic venous insufficiency -echo without high risk findings -reviewed instructions on daily weights, salt avoidance, etc -encouraged activity as tolerated -discussed SGLT2i, declines at this time -discussed compression, elevation. Doing well with compression stockings.  Hypertension -above goal -add amlodipine, watch for swelling (though was on previously and did not have significant increase in edema) -continue valsartan  80 mg BID, furosemide 20 mg bid  Hypercholesterolemia -was on rosuvastatin 5 mg 3x/week previously, did not tolerate -tolerating ezetimibe -she has a very elevated ASCVD risk score  Sinus tachycardia -stable on ECG. HR improves with resting. Increase activity as tolerated.  Cardiac risk counseling and prevention recommendations: -recommend heart healthy/Mediterranean diet, with whole grains, fruits, vegetable, fish, lean meats, nuts, and olive oil. Limit salt. -recommend moderate walking, 3-5 times/week for 30-50 minutes each session. Aim for at least 150 minutes.week. Goal should be pace of 3 miles/hours, or walking 1.5 miles in 30 minutes -recommend avoidance of tobacco products. Avoid excess alcohol. -ASCVD risk score: The 10-year ASCVD risk score (Arnett DK, et al., 2019) is: 50.1%   Values used to calculate the score:     Age: 65 years     Sex: Female     Is Non-Hispanic African American: Yes     Diabetic: Yes     Tobacco smoker: No     Systolic Blood Pressure: 156 mmHg     Is BP treated: Yes     HDL Cholesterol: 72 mg/dL     Total Cholesterol: 190 mg/dL    Plan for follow up: 2 months, or sooner as  needed.  Jodelle Red, MD, PhD, [facility]  [facility]    Medication Adjustments/Labs and Tests Ordered: Current medicines are reviewed at length with the patient today.  Concerns regarding medicines are outlined above.   Orders Placed This Encounter  Procedures   EKG 12-Lead   Meds ordered this encounter  Medications   amLODipine (NORVASC) 5 MG tablet    Sig: Take 1 tablet (5 mg total) by mouth daily.    Dispense:  90 tablet    Refill:  3   Patient Instructions  Medication Instructions:  START AMLODIPINE 5 MG DAILY   *If you need a refill on your cardiac medications before your next appointment, please call your pharmacy*  Lab Work: NONE  Testing/Procedures: NONE   Follow-Up: At [facility], you and your health needs are our priority.  As part of our continuing mission to provide you with exceptional heart care, we have created designated Provider Care Teams.  These Care Teams include your primary Cardiologist (physician) and Advanced Practice Providers (APPs -  Physician Assistants and Nurse Practitioners) who all work together to provide you with the care you need, when you need it.  We recommend signing up for the patient portal called "MyChart".  Sign up information is provided on this After Visit Summary.  MyChart is used to connect with patients for Virtual Visits (Telemedicine).  Patients are able to view lab/test results, encounter notes, upcoming appointments, etc.  Non-urgent messages can be sent to your provider as well.   To learn more about what you can do with MyChart, go to [email or web address].    Your next appointment:   2 month(s)  Provider:   Jodelle Red, MD      I,Rachel Rivera,acting as a scribe for Jodelle Red, MD.,have documented all relevant documentation on the behalf of Jodelle Red, MD,as directed by  Jodelle Red, MD while in the presence of Jodelle Red, MD.  I,  Jodelle Red, MD, have reviewed all documentation for this visit. The documentation on 06/11/23 for the exam, diagnosis, procedures, and orders are all accurate and complete.   Signed, Jodelle Red, MD PhD 04/16/2023     [facility]

## 2023-04-16 ENCOUNTER — Encounter (HOSPITAL_BASED_OUTPATIENT_CLINIC_OR_DEPARTMENT_OTHER): Payer: Self-pay | Admitting: Cardiology

## 2023-04-16 ENCOUNTER — Ambulatory Visit (INDEPENDENT_AMBULATORY_CARE_PROVIDER_SITE_OTHER): Payer: Medicare Other | Admitting: Cardiology

## 2023-04-16 VITALS — BP 156/82 | HR 113 | Ht 63.0 in | Wt 179.4 lb

## 2023-04-16 DIAGNOSIS — Z9189 Other specified personal risk factors, not elsewhere classified: Secondary | ICD-10-CM

## 2023-04-16 DIAGNOSIS — R000 Tachycardia, unspecified: Secondary | ICD-10-CM | POA: Diagnosis not present

## 2023-04-16 DIAGNOSIS — E7800 Pure hypercholesterolemia, unspecified: Secondary | ICD-10-CM | POA: Diagnosis not present

## 2023-04-16 DIAGNOSIS — R600 Localized edema: Secondary | ICD-10-CM

## 2023-04-16 DIAGNOSIS — I5032 Chronic diastolic (congestive) heart failure: Secondary | ICD-10-CM | POA: Diagnosis not present

## 2023-04-16 DIAGNOSIS — I10 Essential (primary) hypertension: Secondary | ICD-10-CM

## 2023-04-16 MED ORDER — AMLODIPINE BESYLATE 5 MG PO TABS: 5.0000 mg | ORAL_TABLET | Freq: Every day | ORAL | 3 refills | Status: DC

## 2023-04-16 NOTE — Patient Instructions (Signed)
Medication Instructions:  START AMLODIPINE 5 MG DAILY   *If you need a refill on your cardiac medications before your next appointment, please call your pharmacy*  Lab Work: NONE  Testing/Procedures: NONE   Follow-Up: At [facility], you and your health needs are our priority.  As part of our continuing mission to provide you with exceptional heart care, we have created designated Provider Care Teams.  These Care Teams include your primary Cardiologist (physician) and Advanced Practice Providers (APPs -  Physician Assistants and Nurse Practitioners) who all work together to provide you with the care you need, when you need it.  We recommend signing up for the patient portal called "MyChart".  Sign up information is provided on this After Visit Summary.  MyChart is used to connect with patients for Virtual Visits (Telemedicine).  Patients are able to view lab/test results, encounter notes, upcoming appointments, etc.  Non-urgent messages can be sent to your provider as well.   To learn more about what you can do with MyChart, go to [email or web address].    Your next appointment:   2 month(s)  Provider:   Jodelle Red, MD

## 2023-04-25 ENCOUNTER — Telehealth (HOSPITAL_BASED_OUTPATIENT_CLINIC_OR_DEPARTMENT_OTHER): Payer: Self-pay | Admitting: Cardiology

## 2023-04-25 NOTE — Telephone Encounter (Signed)
Patient calling to see if the dr knew what was added on her chart, from her visit on 4/17. Please advise

## 2023-04-25 NOTE — Telephone Encounter (Signed)
Returned call to patient,   Patient states she was seen by Dr. Cristal Deer on 4/17, and there was an allergy in her chart that she isn't sure where it came from. Discussed this further with patient and we have removed the incorrect allergy at this time.

## 2023-05-15 ENCOUNTER — Ambulatory Visit (INDEPENDENT_AMBULATORY_CARE_PROVIDER_SITE_OTHER): Payer: Medicare Other | Admitting: Podiatry

## 2023-05-15 ENCOUNTER — Ambulatory Visit: Payer: Medicare Other | Admitting: Podiatry

## 2023-05-15 DIAGNOSIS — M79674 Pain in right toe(s): Secondary | ICD-10-CM | POA: Diagnosis not present

## 2023-05-15 DIAGNOSIS — L84 Corns and callosities: Secondary | ICD-10-CM | POA: Diagnosis not present

## 2023-05-15 DIAGNOSIS — B353 Tinea pedis: Secondary | ICD-10-CM

## 2023-05-15 DIAGNOSIS — M79675 Pain in left toe(s): Secondary | ICD-10-CM

## 2023-05-15 DIAGNOSIS — B351 Tinea unguium: Secondary | ICD-10-CM

## 2023-05-15 MED ORDER — KETOCONAZOLE 2 % EX CREA: 1.0000 | TOPICAL_CREAM | Freq: Every day | CUTANEOUS | 0 refills | Status: DC

## 2023-05-15 NOTE — Progress Notes (Signed)
  Subjective:  [name], female    DOB: 1941-02-25,  MRN: [id number]  Chief Complaint  Patient presents with   Nail Problem    Routine Foot Care     82 y.o. female presents with the above complaint. History confirmed with patient. Patient presenting with pain related to dystrophic thickened elongated nails. Patient is unable to trim own nails related to nail dystrophy and/or mobility issues. Patient does not have a history of T2DM. Patient does have callus present located at the dorsolateral aspect of the right foot 5th PIPJ causing pain.   Objective:  Physical Exam: warm, good capillary refill nail exam onychomycosis of the toenails, onycholysis, and dystrophic nails Mild xerosis of the plantar foot, discoloration medial ankle left side concerning for possible tinea pedis.  DP pulses palpable, PT pulses palpable, and protective sensation intact Left Foot:  Pain with palpation of nails due to elongation and dystrophic growth.  Right Foot: Pain with palpation of nails due to elongation and dystrophic growth. Hyperkeratotic lesion present dorsal lateral aspect fifth toe proximal interphalangeal joint related to adductovarus toe deformity pain with palpation of this area.  Assessment:   1. Pain due to onychomycosis of toenails of both feet   2. Corns and callosities   3. Tinea pedis of left foot        Plan:  Patient was evaluated and treated and all questions answered.  #Hyperkeratotic lesions/pre ulcerative calluses present dorsolateral aspect right 5th toe All symptomatic hyperkeratoses x 1 separate lesions were safely debrided with a sterile #10 blade to patient's level of comfort without incident. We discussed preventative and palliative care of these lesions including supportive and accommodative shoegear, padding, prefabricated and custom molded accommodative orthoses, use of a pumice stone and lotions/creams daily.  #tinea pedis Discussed the etiology and  treatment options for tinea pedis.  Discussed topical and oral treatment.  Recommended topical treatment with 2% ketoconazole cream.  This was sent to the patient's pharmacy.  Also discussed appropriate foot hygiene, use of antifungal spray such as Tinactin in shoes, as well as cleaning her foot surfaces such as showers and bathroom floors with bleach.   #Onychomycosis with pain  -Nails palliatively debrided as below. -Educated on self-care  Procedure: Nail Debridement Rationale: Pain Type of Debridement: manual, sharp debridement. Instrumentation: Nail nipper, rotary burr. Number of Nails: 10  Return in about 9 weeks (around 07/17/2023) for RFC.         Corinna Gab, DPM [facility] / [facility]

## 2023-05-22 ENCOUNTER — Encounter: Payer: Self-pay | Admitting: Podiatry

## 2023-05-22 ENCOUNTER — Ambulatory Visit (INDEPENDENT_AMBULATORY_CARE_PROVIDER_SITE_OTHER): Payer: Medicare Other | Admitting: Podiatry

## 2023-05-22 ENCOUNTER — Ambulatory Visit (INDEPENDENT_AMBULATORY_CARE_PROVIDER_SITE_OTHER): Payer: Medicare Other

## 2023-05-22 DIAGNOSIS — M79672 Pain in left foot: Secondary | ICD-10-CM

## 2023-05-22 DIAGNOSIS — M76822 Posterior tibial tendinitis, left leg: Secondary | ICD-10-CM

## 2023-05-22 MED ORDER — TRIAMCINOLONE ACETONIDE 10 MG/ML IJ SUSP
10.0000 mg | Freq: Once | INTRAMUSCULAR | Status: AC
Start: 2023-05-22 — End: 2023-05-22
  Administered 2023-05-22: 10 mg

## 2023-05-23 NOTE — Progress Notes (Signed)
Subjective:   [name], female   DOB: 82 y.o.   MRN: [id number]   HPI Patient states she was very active over the weekend  and developed pain in the arch of the left foot.  Patient states it has been hard to walk on  ROS      Objective:  Physical Exam  Neurovascular status intact inflammation around the medial arch with patient having natural swelling pattern that she states is a normal phenomenon and has quite a bit of inflammation and pain with pressure to the area around the posterior tibial insertion     Assessment:  Inflammatory tendinitis secondary to gait that occurred with the posterior tibial tendon involved at insertion     Plan:  H&P x-ray taken and I went ahead and I did a careful injection of the medial insertional tendon 3 mg dexamethasone Kenalog 5 mg Xylocaine advised on reduced activity and reappoint if symptoms persist  X-rays indicate no signs of fracture quite a bit of arthritis around this area

## 2023-05-30 ENCOUNTER — Other Ambulatory Visit: Payer: Self-pay | Admitting: Podiatry

## 2023-05-30 DIAGNOSIS — M76822 Posterior tibial tendinitis, left leg: Secondary | ICD-10-CM

## 2023-05-30 DIAGNOSIS — M79672 Pain in left foot: Secondary | ICD-10-CM

## 2023-06-04 ENCOUNTER — Encounter (HOSPITAL_BASED_OUTPATIENT_CLINIC_OR_DEPARTMENT_OTHER): Payer: Self-pay | Admitting: Cardiology

## 2023-06-04 ENCOUNTER — Ambulatory Visit (INDEPENDENT_AMBULATORY_CARE_PROVIDER_SITE_OTHER): Payer: Medicare Other | Admitting: Cardiology

## 2023-06-04 VITALS — BP 134/68 | HR 110 | Ht 63.0 in | Wt 176.1 lb

## 2023-06-04 DIAGNOSIS — Z7189 Other specified counseling: Secondary | ICD-10-CM

## 2023-06-04 DIAGNOSIS — I10 Essential (primary) hypertension: Secondary | ICD-10-CM

## 2023-06-04 DIAGNOSIS — E7800 Pure hypercholesterolemia, unspecified: Secondary | ICD-10-CM | POA: Diagnosis not present

## 2023-06-04 DIAGNOSIS — I5032 Chronic diastolic (congestive) heart failure: Secondary | ICD-10-CM | POA: Diagnosis not present

## 2023-06-04 DIAGNOSIS — R000 Tachycardia, unspecified: Secondary | ICD-10-CM

## 2023-06-04 NOTE — Patient Instructions (Addendum)
Medication Instructions:  Your physician recommends that you continue on your current medications as directed. Please refer to the Current Medication list given to you today.  *If you need a refill on your cardiac medications before your next appointment, please call your pharmacy*  Lab Work: FASTING LP/CMET SOON   Testing/Procedures: NONE  Follow-Up: At [facility], you and your health needs are our priority.  As part of our continuing mission to provide you with exceptional heart care, we have created designated Provider Care Teams.  These Care Teams include your primary Cardiologist (physician) and Advanced Practice Providers (APPs -  Physician Assistants and Nurse Practitioners) who all work together to provide you with the care you need, when you need it.  We recommend signing up for the patient portal called "MyChart".  Sign up information is provided on this After Visit Summary.  MyChart is used to connect with patients for Virtual Visits (Telemedicine).  Patients are able to view lab/test results, encounter notes, upcoming appointments, etc.  Non-urgent messages can be sent to your provider as well.   To learn more about what you can do with MyChart, go to [email or web address].    Your next appointment:   6 month(s)  The format for your next appointment:   In Person  Provider:   Jodelle Red, MD

## 2023-06-04 NOTE — Progress Notes (Signed)
Cardiology Office Note:    Date:  06/04/2023   ID:  Shanija, Perusse [birth year], MRN [id number]  PCP:  Margaree Mackintosh, MD  Cardiologist:  Jodelle Red, MD  Referring MD: Margaree Mackintosh, MD   CC: follow up  History of Present Illness:    Lisa [name] is a 82 y.o. female with a hx of asthma, hyperlipidemia, hypertension, obesity, arthritis, cervical cancer, and goiter, who is seen for follow up today. I initially met her 10/23/21 as a new consult at the request of Baxley, Luanna Cole, MD for the evaluation and management of tachycardia.  Cardiovascular risk factors: Prior clinical ASCVD: None Comorbid conditions: hypertension, hyperlipidemia Metabolic syndrome/Obesity:  Obesity, Chronic inflammatory conditions: none Tobacco use history: Not a smoker (2-3 months in college) Alcohol consumption: Every now and then Family history: mother and father had hypertension, father had heart disease  At a prior appointment, she had started a regimen for allergies and asthma. In the mornings: took Allegra, her inhaler, nose spray, and saline spray. In the evenings: used her inhaler, claritin, and azelastine. Blood pressure was elevated above normal in clinic, but improved on recheck. Home averages with blood pressure in the 140's/70s-80's. She complained of bilateral swelling to the ankles; was doing well with compression stockings and BID lasix. We also discussed SGLT2i, which she declined. Was on rosuvastatin 5 mg 3x/week previously, reported not taking at her visit with Dr. Lenord Fellers 12/18/21.  At her appointment on 01/16/2023, her sister was present for exam. Her headaches had resolved. In clinic her blood pressure was initially 176/82, which she attributed to walking into the office. Her blood pressure improved to 138/72 on recheck. She reported more well controlled readings at home and other clinic visits. She continued to have seasonal allergies, treated with medications. We reviewed  these and discussed if they had potential affects with her blood pressure. Additionally she complained of tinnitus with onset a couple weeks prior that had been intermittent mostly at nighttime. It did not occur every day.   At her last visit, her blood pressure was elevated which she attributed to multiple life stressors. She had associated anxiety and was struggling to sleep. We started 5 mg amlodipine daily.   Today, she is accompanied by her sister. She states she is feeling pretty good. Her main complaint today is her seasonal allergies. Sometimes she has woken up in the mornings and felt a little dizzy. She isn't certain if this is a side effect of her valsartan/amlodipine or her allergies.  Two weeks ago, she was travelling and completed more walking that time than she had done since her left knee surgery. However, on a Sunday evening she developed left foot pain. She has followed up with her podiatrist who felt she had a bruised tendon due to the prolonged walking. At home she has both a cane and walker. Usually she uses her walker to feel more stable while walking. Since her knee surgery she denies any significant issues from her knees. She has chronic swelling in her ankles and does wear her compression socks when able.  In the office today her blood pressure is 164/75. On manual recheck her BP is 132/70 in her right arm, and 134/68 in her left arm.   Lately she believes she has been sleeping better.   She denies any palpitations, chest pain, shortness of breath, headaches, syncope, orthopnea, or PND.   Past Medical History:  Diagnosis Date   Allergy    Arthritis  Cancer (HCC)    cervical   Complication of anesthesia    Difficulty waking up after hysterectomy in 1973   Goiter    Headache    Sinus headaches   Hyperlipidemia    Hypertension    Obesity    Osteopenia    Vitamin D deficiency     Past Surgical History:  Procedure Laterality Date   ABDOMINAL HYSTERECTOMY      EYE SURGERY     HAMMER TOE SURGERY  12/02   right   TOTAL KNEE ARTHROPLASTY Left 08/21/2020   Procedure: TOTAL KNEE ARTHROPLASTY;  Surgeon: Ollen Gross, MD;  Location: [facility];  Service: Orthopedics;  Laterality: Left;    WISDOM TOOTH EXTRACTION      Current Medications: Current Outpatient Medications on File Prior to Visit  Medication Sig   acetaminophen (TYLENOL) 500 MG tablet Take 500-1,000 mg by mouth every 6 (six) hours as needed for mild pain or moderate pain.   albuterol (VENTOLIN HFA) 108 (90 Base) MCG/ACT inhaler Inhale 2 puffs into the lungs every 6 (six) hours as needed for wheezing or shortness of breath.   amLODipine (NORVASC) 5 MG tablet Take 1 tablet (5 mg total) by mouth daily.   Azelastine HCl 137 MCG/SPRAY SOLN 1 spray ea nostril   ezetimibe (ZETIA) 10 MG tablet Take 1 tablet (10 mg total) by mouth daily.   fexofenadine (ALLEGRA) 180 MG tablet 1 tablet   fluticasone (FLONASE) 50 MCG/ACT nasal spray Place 2 sprays into both nostrils daily as needed for allergies or rhinitis.   fluticasone (FLOVENT HFA) 110 MCG/ACT inhaler 2 puffs Inhalation Twice a day for 30 days   furosemide (LASIX) 20 MG tablet Take 1 tablet (20 mg total) by mouth 2 (two) times daily.   influenza vaccine adjuvanted (FLUAD QUADRIVALENT) 0.5 ML injection Inject into the muscle.   ketoconazole (NIZORAL) 2 % cream Apply 1 Application topically daily.   ketoconazole (NIZORAL) 2 % cream Apply 1 Application topically daily.   loratadine (CLARITIN) 10 MG tablet Take 10 mg by mouth daily.   RSV vaccine recomb adjuvanted (AREXVY) 120 MCG/0.5ML injection Inject into the muscle.   Spacer/Aero-Holding Chambers DEVI 1 each by Does not apply route in the morning, at noon, in the evening, and at bedtime.   triamcinolone ointment (KENALOG) 0.1 % SMARTSIG:sparingly Topical Twice Daily PRN   valsartan (DIOVAN) 80 MG tablet TAKE ONE TABLET BY MOUTH TWICE DAILY   No current facility-administered medications on file  prior to visit.     Allergies:   Grass pollen(k-o-r-t-swt vern), Penicillins, Pollen extract, Codeine, Darvon, and Tall ragweed   Social History   Tobacco Use   Smoking status: Never   Smokeless tobacco: Never  Vaping Use   Vaping Use: Never used  Substance Use Topics   Alcohol use: No    Comment: Wine once or twice a year   Drug use: No    Family History: family history includes Heart disease in her father; Hypertension in her father and mother; Thyroid disease in her father.  ROS:   Please see the history of present illness. (+) Seasonal allergies (+) Dizziness (+) Left foot pain (+) Bilateral ankle swelling R>L All other systems are reviewed and negative.     EKGs/Labs/Other Studies Reviewed:    The following studies were reviewed today:  Echo 10/31/2021: Sonographer Comments: Image acquisition challenging due to patient body habitus.  IMPRESSIONS   1. Left ventricular ejection fraction, by estimation, is 60 to 65%. The  left  ventricle has normal function. The left ventricle has no regional  wall motion abnormalities. Left ventricular diastolic parameters are  consistent with Grade II diastolic  dysfunction (pseudonormalization). Elevated left atrial pressure.   2. Right ventricular systolic function is normal. The right ventricular  size is normal.   3. Suspect that there is probably moderate eccentric mitral  insufficiency, but this is poorly visualized. The mitral valve is grossly  normal. No evidence of mitral valve regurgitation. No evidence of mitral  stenosis.   4. The aortic valve is normal in structure. Aortic valve regurgitation is  trivial. Mild to moderate aortic valve sclerosis/calcification is present,  without any evidence of aortic stenosis.   5. The inferior vena cava is normal in size with greater than 50%  respiratory variability, suggesting right atrial pressure of 3 mmHg.   Comparison(s): No prior Echocardiogram.    Conclusion(s)/Recommendation(s): If physical findings suggest more than  moderate MR, consider TEE for better evaluation.  LE Venous DVT 08/16/2021: Summary:  RIGHT:  - There is no evidence of deep vein thrombosis in the lower extremity.  - No cystic structure found in the popliteal fossa.  LEFT:  - No evidence of common femoral vein obstruction.   EKG:  EKG is personally reviewed 06/04/2023:  not ordered today 01/16/2023:  sinus tachycardia at 125 bpm 07/05/2022: sinus tachycardia at 107 bpm 01/23/2022: sinus tachycardia at 114 bpm 10/23/2021: NSR at 99 bpm, nonspecific ST pattern  Recent Labs: No results found for requested labs within last 365 days.   Recent Lipid Panel    Component Value Date/Time   CHOL 190 05/28/2022 0956   TRIG 40 05/28/2022 0956   HDL 72 05/28/2022 0956   CHOLHDL 2.6 05/28/2022 0956   VLDL 11 01/14/2017 1136   LDLCALC 106 (H) 05/28/2022 0956    Physical Exam:    VS:  BP 134/68 (BP Location: Left Arm, Patient Position: Sitting, Cuff Size: Normal)   Pulse (!) 110   Ht 5\' 3"  (1.6 m)   Wt 176 lb 1.6 oz (79.9 kg)   BMI 31.19 kg/m     Wt Readings from Last 3 Encounters:  06/04/23 176 lb 1.6 oz (79.9 kg)  04/16/23 179 lb 6.4 oz (81.4 kg)  01/16/23 181 lb 6.4 oz (82.3 kg)    GEN: Well nourished, well developed in no acute distress HEENT: Normal, moist mucous membranes NECK: No JVD CARDIAC: regular rhythm, normal S1 and S2, no rubs or gallops. No murmur. VASCULAR: Radial and DP pulses 2+ bilaterally. No carotid bruits RESPIRATORY:  Clear to auscultation without rales, wheezing or rhonchi  ABDOMEN: Soft, non-tender, non-distended MUSCULOSKELETAL:  Ambulates independently with walker SKIN: Warm and dry, compression stockings in place. Trivial bilateral LE edema NEUROLOGIC:  Alert and oriented x 3. No focal neuro deficits noted. PSYCHIATRIC:  Normal affect    ASSESSMENT:    1. Primary hypertension   2. Sinus tachycardia   3. Chronic diastolic  heart failure (HCC)   4. Pure hypercholesterolemia   5. Counseling on health promotion and disease prevention     PLAN:    Bilateral LE edema: -stable on BID lasix, continue -suspect components of both chronic diastolic heart failure and chronic venous insufficiency -echo without high risk findings -reviewed instructions on daily weights, salt avoidance, etc -encouraged activity as tolerated -discussed SGLT2i, declines -discussed compression, elevation. Doing well with compression stockings.  Hypertension -recheck improved -continue valsartan 80 mg BID, furosemide 20 mg bid -was on amlodipine prior, did not significantly change swelling,  if BP remains elevated could consider adding back  Hypercholesterolemia -was on rosuvastatin 5 mg 3x/week previously, did not tolerate -tolerating ezetimibe -she has a very elevated ASCVD risk score  Sinus tachycardia -HR improves with resting. Increase activity as tolerated.  Cardiac risk counseling and prevention recommendations: -recommend heart healthy/Mediterranean diet, with whole grains, fruits, vegetable, fish, lean meats, nuts, and olive oil. Limit salt. -recommend moderate walking, 3-5 times/week for 30-50 minutes each session. Aim for at least 150 minutes.week. Goal should be pace of 3 miles/hours, or walking 1.5 miles in 30 minutes -recommend avoidance of tobacco products. Avoid excess alcohol. -ASCVD risk score: The 10-year ASCVD risk score (Arnett DK, et al., 2019) is: 44%   Values used to calculate the score:     Age: 44 years     Sex: Female     Is Non-Hispanic African American: Yes     Diabetic: Yes     Tobacco smoker: No     Systolic Blood Pressure: 134 mmHg     Is BP treated: Yes     HDL Cholesterol: 72 mg/dL     Total Cholesterol: 190 mg/dL    Plan for follow up: 6 months, or sooner as needed.  Jodelle Red, MD, PhD, [facility]  [facility]    I,Mathew Stumpf,acting as a scribe for Jodelle Red, MD.,have documented all relevant documentation on the behalf of Jodelle Red, MD,as directed by  Jodelle Red, MD while in the presence of Jodelle Red, MD.  I, Jodelle Red, MD, have reviewed all documentation for this visit. The documentation on 07/24/23 for the exam, diagnosis, procedures, and orders are all accurate and complete.   Signed, Jodelle Red, MD PhD 06/04/2023     [facility]

## 2023-06-06 DIAGNOSIS — E7800 Pure hypercholesterolemia, unspecified: Secondary | ICD-10-CM | POA: Diagnosis not present

## 2023-06-06 DIAGNOSIS — I10 Essential (primary) hypertension: Secondary | ICD-10-CM | POA: Diagnosis not present

## 2023-06-07 LAB — COMPREHENSIVE METABOLIC PANEL
ALT: 17 IU/L (ref 0–32)
AST: 22 IU/L (ref 0–40)
Albumin/Globulin Ratio: 1.5 (ref 1.2–2.2)
Albumin: 4.3 g/dL (ref 3.8–4.8)
Alkaline Phosphatase: 119 IU/L (ref 44–121)
BUN/Creatinine Ratio: 24 (ref 12–28)
BUN: 25 mg/dL (ref 8–27)
Bilirubin Total: 0.7 mg/dL (ref 0.0–1.2)
CO2: 22 mmol/L (ref 20–29)
Calcium: 9.7 mg/dL (ref 8.7–10.3)
Chloride: 106 mmol/L (ref 96–106)
Creatinine, Ser: 1.03 mg/dL — ABNORMAL HIGH (ref 0.57–1.00)
Globulin, Total: 2.9 g/dL (ref 1.5–4.5)
Glucose: 104 mg/dL — ABNORMAL HIGH (ref 70–99)
Potassium: 4.2 mmol/L (ref 3.5–5.2)
Sodium: 142 mmol/L (ref 134–144)
Total Protein: 7.2 g/dL (ref 6.0–8.5)
eGFR: 55 mL/min/{1.73_m2} — ABNORMAL LOW (ref >59)

## 2023-06-07 LAB — LIPID PANEL
Chol/HDL Ratio: 2.4 ratio (ref 0.0–4.4)
Cholesterol, Total: 173 mg/dL (ref 100–199)
HDL: 72 mg/dL (ref >39)
LDL Chol Calc (NIH): 92 mg/dL (ref 0–99)
Triglycerides: 44 mg/dL (ref 0–149)
VLDL Cholesterol Cal: 9 mg/dL (ref 5–40)

## 2023-06-11 ENCOUNTER — Encounter (HOSPITAL_BASED_OUTPATIENT_CLINIC_OR_DEPARTMENT_OTHER): Payer: Self-pay | Admitting: Cardiology

## 2023-06-17 DIAGNOSIS — H9313 Tinnitus, bilateral: Secondary | ICD-10-CM | POA: Diagnosis not present

## 2023-06-17 DIAGNOSIS — H9312 Tinnitus, left ear: Secondary | ICD-10-CM | POA: Insufficient documentation

## 2023-06-17 DIAGNOSIS — H9319 Tinnitus, unspecified ear: Secondary | ICD-10-CM | POA: Diagnosis not present

## 2023-06-17 DIAGNOSIS — H9190 Unspecified hearing loss, unspecified ear: Secondary | ICD-10-CM | POA: Diagnosis not present

## 2023-06-17 DIAGNOSIS — H9113 Presbycusis, bilateral: Secondary | ICD-10-CM | POA: Insufficient documentation

## 2023-06-17 DIAGNOSIS — H938X9 Other specified disorders of ear, unspecified ear: Secondary | ICD-10-CM | POA: Diagnosis not present

## 2023-06-17 DIAGNOSIS — H903 Sensorineural hearing loss, bilateral: Secondary | ICD-10-CM | POA: Diagnosis not present

## 2023-06-20 ENCOUNTER — Ambulatory Visit: Payer: Medicare Other | Admitting: Podiatry

## 2023-06-30 DIAGNOSIS — Z124 Encounter for screening for malignant neoplasm of cervix: Secondary | ICD-10-CM | POA: Diagnosis not present

## 2023-06-30 DIAGNOSIS — Z01419 Encounter for gynecological examination (general) (routine) without abnormal findings: Secondary | ICD-10-CM | POA: Diagnosis not present

## 2023-06-30 DIAGNOSIS — Z01411 Encounter for gynecological examination (general) (routine) with abnormal findings: Secondary | ICD-10-CM | POA: Diagnosis not present

## 2023-06-30 DIAGNOSIS — C539 Malignant neoplasm of cervix uteri, unspecified: Secondary | ICD-10-CM | POA: Diagnosis not present

## 2023-06-30 DIAGNOSIS — Z6831 Body mass index (BMI) 31.0-31.9, adult: Secondary | ICD-10-CM | POA: Diagnosis not present

## 2023-06-30 DIAGNOSIS — Z90711 Acquired absence of uterus with remaining cervical stump: Secondary | ICD-10-CM | POA: Diagnosis not present

## 2023-07-01 ENCOUNTER — Ambulatory Visit (INDEPENDENT_AMBULATORY_CARE_PROVIDER_SITE_OTHER): Payer: Medicare Other | Admitting: Podiatry

## 2023-07-01 DIAGNOSIS — I739 Peripheral vascular disease, unspecified: Secondary | ICD-10-CM

## 2023-07-01 DIAGNOSIS — B351 Tinea unguium: Secondary | ICD-10-CM | POA: Diagnosis not present

## 2023-07-01 DIAGNOSIS — L84 Corns and callosities: Secondary | ICD-10-CM

## 2023-07-01 NOTE — Progress Notes (Signed)
    Subjective:  [name], female    DOB: 12/29/1941,  MRN: [id number]  Lisa [name] presents to clinic today for:  Chief Complaint  Patient presents with   Nail Problem    PFC pt also wants her corn from her right foot great toe to be shaved down   . Patient notes nails are thick and elongated, causing pain in shoe gear when ambulating.  She also has a painful corn on the right fifth toe that she would like shaved today.  She wears compression stockings regularly.  She lives with her sister.  PCP is Baxley, Luanna Cole, MD.  Allergies  Allergen Reactions   Grass Pollen(K-O-R-T-Swt Vern) Itching and Shortness Of Breath   Penicillins Other (See Comments)    Chest pain 12 years   Pollen Extract Cough, Itching and Shortness Of Breath   Codeine Other (See Comments)    Hard time waking-up   Darvon Nausea And Vomiting   Tall Ragweed Itching and Other (See Comments)    Seasonal  Congestion/ watery eyes/ Sneezing and chest congestion, itching eyes and ears Trees Grass     Review of Systems: Negative except as noted in the HPI.  Objective:  There were no vitals filed for this visit.  Lisa [name] is a pleasant 82 y.o. female in NAD. AAO x 3.  Vascular Examination: Patient has palpable DP pulse, absent PT pulse bilateral.  Delayed capillary refill bilateral toes.  Sparse digital hair bilateral.  Proximal to distal cooling WNL bilateral.    Dermatological Examination: Interspaces are clear with no open lesions noted bilateral.  Nails are 3-5mm thick, with yellowish/brown discoloration, subungual debris and distal onycholysis x10.  There is pain with compression of nails x10.  There are hyperkeratotic lesions noted dorsal lateral aspect of right fifth toe PIPJ.  Neurological Examination: Protective sensation intact b/l LE. Vibratory sensation intact b/l LE.  Musculoskeletal Examination: Muscle strength 5/5 to all LE muscle groups b/l.  Mild adductovarus  deformity to the right fifth toe.  Patient qualifies for at-risk foot care because of mild PVD, pain and nails.  Assessment/Plan: 1. Dermatophytosis of nail   2. Corns   3. PVD (peripheral vascular disease) (HCC)     Mycotic nails x10 were sharply debrided with sterile nail nippers and power debriding burr to decrease bulk and length.  Hyperkeratotic lesion on right fifth toe PIPJ was shaved with #312 blade.  Continue with daily use of compression stockings.  Return in about 3 months (around 10/01/2023) for RFC.   Clerance Lav, DPM, FACFAS [facility]     [address] [place], [place] [address]                Office [phone]  Fax [phone]

## 2023-07-07 ENCOUNTER — Telehealth: Payer: Self-pay | Admitting: Physician Assistant

## 2023-07-07 NOTE — Telephone Encounter (Signed)
Lisa [name], pt will be due for recall colonoscopy in 07/2023. Pt was last seen in 10/2022. Will she need updated OV or can she proceed with scheduling recall colonoscopy? Please advise, thanks.

## 2023-07-07 NOTE — Telephone Encounter (Signed)
Chary reviewed  Looks ok for direct colonoscopy  Hx polyps

## 2023-07-07 NOTE — Telephone Encounter (Signed)
Inbound call from patient calling to schedule procedure. Inquiring if she still needs to come in for a ov since she had one in November. Patient states she came into the office in November thinking it was time for her procedure. Please advise on scheduling.

## 2023-07-07 NOTE — Telephone Encounter (Signed)
Lisa [name], please see note below. OK for direct colonoscopy in the [facility] - hx of colon polyps. Thanks

## 2023-07-09 ENCOUNTER — Encounter: Payer: Self-pay | Admitting: Internal Medicine

## 2023-07-09 NOTE — Telephone Encounter (Signed)
Patient scheduled for procedure and previsit in September

## 2023-07-25 ENCOUNTER — Other Ambulatory Visit (HOSPITAL_BASED_OUTPATIENT_CLINIC_OR_DEPARTMENT_OTHER): Payer: Self-pay | Admitting: Cardiology

## 2023-07-25 DIAGNOSIS — R600 Localized edema: Secondary | ICD-10-CM

## 2023-07-25 DIAGNOSIS — I10 Essential (primary) hypertension: Secondary | ICD-10-CM

## 2023-07-25 DIAGNOSIS — I5032 Chronic diastolic (congestive) heart failure: Secondary | ICD-10-CM

## 2023-08-06 ENCOUNTER — Other Ambulatory Visit (HOSPITAL_BASED_OUTPATIENT_CLINIC_OR_DEPARTMENT_OTHER): Payer: Self-pay | Admitting: Cardiology

## 2023-08-06 DIAGNOSIS — I10 Essential (primary) hypertension: Secondary | ICD-10-CM

## 2023-08-06 NOTE — Telephone Encounter (Signed)
Rx request sent to pharmacy.  

## 2023-09-03 ENCOUNTER — Ambulatory Visit (AMBULATORY_SURGERY_CENTER): Payer: Medicare Other

## 2023-09-03 VITALS — Ht 63.0 in | Wt 178.0 lb

## 2023-09-03 DIAGNOSIS — Z86010 Personal history of colonic polyps: Secondary | ICD-10-CM

## 2023-09-03 MED ORDER — NA SULFATE-K SULFATE-MG SULF 17.5-3.13-1.6 GM/177ML PO SOLN
1.0000 | Freq: Once | ORAL | 0 refills | Status: AC
Start: 2023-09-03 — End: 2023-09-03

## 2023-09-03 NOTE — Progress Notes (Signed)
No egg or soy allergy known to patient  No issues known to pt with past sedation with any surgeries or procedures Patient denies ever being told they had issues or difficulty with intubation  No FH of Malignant Hyperthermia Pt is not on diet pills Pt is not on  home 02  Pt is not on blood thinners  Pt denies issues with constipation  No A fib or A flutter Have any cardiac testing pending--NO Pt can ambulate with cane Pt denies use of chewing tobacco Discussed diabetic I weight loss medication holds Discussed NSAID holds Checked BMI Pt instructed to use [employer] or GoodRx for a price reduction on prep  Patient's chart reviewed by Cathlyn Parsons CNRA prior to previsit and patient appropriate for the [facility].  Pre visit completed and red dot placed by patient's name on their procedure day (on provider's schedule).

## 2023-09-04 ENCOUNTER — Ambulatory Visit (INDEPENDENT_AMBULATORY_CARE_PROVIDER_SITE_OTHER): Payer: Medicare Other | Admitting: Podiatry

## 2023-09-04 DIAGNOSIS — M79675 Pain in left toe(s): Secondary | ICD-10-CM | POA: Diagnosis not present

## 2023-09-04 DIAGNOSIS — I739 Peripheral vascular disease, unspecified: Secondary | ICD-10-CM

## 2023-09-04 DIAGNOSIS — M79674 Pain in right toe(s): Secondary | ICD-10-CM | POA: Diagnosis not present

## 2023-09-04 DIAGNOSIS — B351 Tinea unguium: Secondary | ICD-10-CM | POA: Diagnosis not present

## 2023-09-04 DIAGNOSIS — L84 Corns and callosities: Secondary | ICD-10-CM | POA: Diagnosis not present

## 2023-09-04 NOTE — Progress Notes (Signed)
  Subjective:  [name], female    DOB: 06-02-1941,  MRN: [id number]  Chief Complaint  Patient presents with   Nail Problem    RFC   Callouses    Right 5 th toe     82 y.o. female presents with the above complaint. History confirmed with patient. Patient presenting with pain related to dystrophic thickened elongated nails. Patient is unable to trim own nails related to nail dystrophy and/or mobility issues. Patient does not have a history of T2DM. Patient does have callus present located at the dorsolateral aspect of the right foot 5th PIPJ causing pain.   Objective:  Physical Exam: warm, good capillary refill nail exam onychomycosis of the toenails, onycholysis, and dystrophic nails Mild xerosis of the plantar foot, discoloration medial ankle left side concerning for possible tinea pedis.  DP pulses palpable, PT pulses palpable, and protective sensation intact Left Foot:  Pain with palpation of nails due to elongation and dystrophic growth.  Right Foot: Pain with palpation of nails due to elongation and dystrophic growth. Hyperkeratotic lesion present dorsal lateral aspect fifth toe proximal interphalangeal joint related to adductovarus toe deformity pain with palpation of this area.  Assessment:   1. Corns   2. Pain due to onychomycosis of toenails of both feet   3. PVD (peripheral vascular disease) (HCC)         Plan:  Patient was evaluated and treated and all questions answered.  #Hyperkeratotic lesions/pre ulcerative calluses present dorsolateral aspect right 5th toe All symptomatic hyperkeratoses x 1 separate lesions were safely debrided with a sterile #10 blade to patient's level of comfort without incident. We discussed preventative and palliative care of these lesions including supportive and accommodative shoegear, padding, prefabricated and custom molded accommodative orthoses, use of a pumice stone and lotions/creams daily.  #Onychomycosis with  pain  -Nails palliatively debrided as below. -Educated on self-care  Procedure: Nail Debridement Rationale: Pain Type of Debridement: manual, sharp debridement. Instrumentation: Nail nipper, rotary burr. Number of Nails: 10  No follow-ups on file.         Corinna Gab, DPM [facility] / [facility]

## 2023-09-16 ENCOUNTER — Telehealth: Payer: Self-pay | Admitting: Physician Assistant

## 2023-09-16 NOTE — Telephone Encounter (Signed)
Inbound call from patient, would like to know when to take her blood pressure medication, she also wants to know when to take the puff of her inhaler the morning of her procedure.

## 2023-09-16 NOTE — Telephone Encounter (Signed)
Spoke with pt and instructed her on when to take her BP ( she takes it 2 x every day) and use her inhaler. Instructed her to take BP med before 7:30 cut off time for fluids and to use her inhaler when she gets up at %:30 to start prep since that is her norm ("I take it when I get up"). Pt verbalized understanding and did not have any other questions or concerns at this time.

## 2023-09-18 ENCOUNTER — Encounter: Payer: Self-pay | Admitting: Internal Medicine

## 2023-09-18 ENCOUNTER — Ambulatory Visit (AMBULATORY_SURGERY_CENTER): Payer: Medicare Other | Admitting: Internal Medicine

## 2023-09-18 VITALS — BP 146/83 | HR 97 | Temp 98.4°F | Resp 21 | Ht 63.0 in | Wt 178.0 lb

## 2023-09-18 DIAGNOSIS — Z86010 Personal history of colonic polyps: Secondary | ICD-10-CM

## 2023-09-18 DIAGNOSIS — I10 Essential (primary) hypertension: Secondary | ICD-10-CM | POA: Diagnosis not present

## 2023-09-18 DIAGNOSIS — Z09 Encounter for follow-up examination after completed treatment for conditions other than malignant neoplasm: Secondary | ICD-10-CM

## 2023-09-18 DIAGNOSIS — E669 Obesity, unspecified: Secondary | ICD-10-CM | POA: Diagnosis not present

## 2023-09-18 DIAGNOSIS — J45909 Unspecified asthma, uncomplicated: Secondary | ICD-10-CM | POA: Diagnosis not present

## 2023-09-18 MED ORDER — SODIUM CHLORIDE 0.9 % IV SOLN: 500.0000 mL | INTRAVENOUS | Status: DC

## 2023-09-18 NOTE — Progress Notes (Signed)
[facility] History and Physical   Primary Care Physician:  Margaree Mackintosh, MD   Reason for Procedure:   Hx colon polyps  Plan:    colonoscopy     HPI: Lisa [name] is a 82 y.o. female s/p removal of 3 cm adenoma years ago - here for a 5 year surveillance exam.   Past Medical History:  Diagnosis Date   Allergy    Arthritis    Asthma    Cancer (HCC)    cervical   Complication of anesthesia    Difficulty waking up after hysterectomy in 1973   Goiter    Headache    Sinus headaches   Hyperlipidemia    Hypertension    Obesity    Osteopenia    Vitamin D deficiency     Past Surgical History:  Procedure Laterality Date   ABDOMINAL HYSTERECTOMY     COLONOSCOPY     EYE SURGERY     HAMMER TOE SURGERY  11/2001   right   TOTAL KNEE ARTHROPLASTY Left 08/21/2020   Procedure: TOTAL KNEE ARTHROPLASTY;  Surgeon: Ollen Gross, MD;  Location: [facility];  Service: Orthopedics;  Laterality: Left;    WISDOM TOOTH EXTRACTION      Prior to Admission medications   Medication Sig Start Date End Date Taking? Authorizing Provider  albuterol (VENTOLIN HFA) 108 (90 Base) MCG/ACT inhaler Inhale 2 puffs into the lungs every 6 (six) hours as needed for wheezing or shortness of breath. 01/10/23  Yes Baxley, Luanna Cole, MD  amLODipine (NORVASC) 5 MG tablet Take 1 tablet (5 mg total) by mouth daily. 04/16/23 04/10/24 Yes Jodelle Red, MD  ezetimibe (ZETIA) 10 MG tablet Take 1 tablet (10 mg total) by mouth daily. 01/16/23 01/11/24 Yes Jodelle Red, MD  fexofenadine [facility]) 180 MG tablet 1 tablet 02/19/22  Yes [provider]  fluticasone (FLONASE) 50 MCG/ACT nasal spray Place 2 sprays into both nostrils daily as needed for allergies or rhinitis.   Yes [provider]  fluticasone (FLOVENT HFA) 110 MCG/ACT inhaler 2 puffs Inhalation Twice a day for 30 days   Yes [provider]  furosemide (LASIX) 20 MG tablet TAKE ONE TABLET BY MOUTH  TWICE DAILY 07/25/23  Yes Jodelle Red, MD  loratadine (CLARITIN) 10 MG tablet Take 10 mg by mouth daily.   Yes [provider]  valsartan (DIOVAN) 80 MG tablet TAKE ONE TABLET BY MOUTH TWICE DAILY 08/06/23  Yes Jodelle Red, MD  acetaminophen (TYLENOL) 500 MG tablet Take 500-1,000 mg by mouth every 6 (six) hours as needed for mild pain or moderate pain.    [provider]  Azelastine HCl 137 MCG/SPRAY SOLN  02/19/22   [provider]  influenza vaccine adjuvanted (FLUAD QUADRIVALENT) 0.5 ML injection Inject into the muscle. 10/25/22     ketoconazole (NIZORAL) 2 % cream Apply 1 Application topically daily. Patient not taking: Reported on 09/18/2023 12/12/22   Standiford, Jenelle Mages, DPM  ketoconazole (NIZORAL) 2 % cream Apply 1 Application topically daily. Patient not taking: Reported on 09/18/2023 05/15/23   Standiford, Jenelle Mages, DPM  RSV vaccine recomb adjuvanted (AREXVY) 120 MCG/0.5ML injection Inject into the muscle. 11/18/22   Judyann Munson, MD  Spacer/Aero-Holding Deretha Emory DEVI 1 each by Does not apply route in the morning, at noon, in the evening, and at bedtime. 12/18/21   Margaree Mackintosh, MD  triamcinolone ointment (KENALOG) 0.1 %  02/19/22   [provider]    Current Outpatient Medications  Medication  Sig Dispense Refill   albuterol (VENTOLIN HFA) 108 (90 Base) MCG/ACT inhaler Inhale 2 puffs into the lungs every 6 (six) hours as needed for wheezing or shortness of breath. 8.5 g 0   amLODipine (NORVASC) 5 MG tablet Take 1 tablet (5 mg total) by mouth daily. 90 tablet 3   ezetimibe (ZETIA) 10 MG tablet Take 1 tablet (10 mg total) by mouth daily. 90 tablet 3   fexofenadine (ALLEGRA) 180 MG tablet 1 tablet     fluticasone (FLONASE) 50 MCG/ACT nasal spray Place 2 sprays into both nostrils daily as needed for allergies or rhinitis.     fluticasone (FLOVENT HFA) 110 MCG/ACT inhaler 2 puffs Inhalation Twice a day for 30 days      furosemide (LASIX) 20 MG tablet TAKE ONE TABLET BY MOUTH TWICE DAILY 180 tablet 3   loratadine (CLARITIN) 10 MG tablet Take 10 mg by mouth daily.     valsartan (DIOVAN) 80 MG tablet TAKE ONE TABLET BY MOUTH TWICE DAILY 180 tablet 1   acetaminophen (TYLENOL) 500 MG tablet Take 500-1,000 mg by mouth every 6 (six) hours as needed for mild pain or moderate pain.     Azelastine HCl 137 MCG/SPRAY SOLN  (Patient not taking: Reported on 09/18/2023)     influenza vaccine adjuvanted (FLUAD QUADRIVALENT) 0.5 ML injection Inject into the muscle. 0.5 mL 0   ketoconazole (NIZORAL) 2 % cream Apply 1 Application topically daily. (Patient not taking: Reported on 09/18/2023) 15 g 0   ketoconazole (NIZORAL) 2 % cream Apply 1 Application topically daily. (Patient not taking: Reported on 09/18/2023) 30 g 0   RSV vaccine recomb adjuvanted (AREXVY) 120 MCG/0.5ML injection Inject into the muscle. 0.5 mL 0   Spacer/Aero-Holding Chambers DEVI 1 each by Does not apply route in the morning, at noon, in the evening, and at bedtime. 1 each 0   triamcinolone ointment (KENALOG) 0.1 %  (Patient not taking: Reported on 09/18/2023)     Current Facility-Administered Medications  Medication Dose Route Frequency Provider Last Rate Last Admin   0.9 %  sodium chloride infusion  500 mL Intravenous Continuous Iva Boop, MD        Allergies as of 09/18/2023 - Review Complete 09/18/2023  Allergen Reaction Noted   Grass pollen(k-o-r-t-swt vern) Itching and Shortness Of Breath 02/21/2022   Penicillins Other (See Comments) 08/26/2011   Pollen extract Cough, Itching, and Shortness Of Breath 02/21/2022   Codeine Other (See Comments) 08/26/2011   Darvon Nausea And Vomiting 08/26/2011   Tall ragweed Itching and Other (See Comments) 08/09/2020    Family History  Problem Relation Age of Onset   Colon polyps Mother    Hypertension Mother    Heart disease Father    Hypertension Father    Thyroid disease Father    Colon polyps Sister     Colon cancer Neg Hx    Esophageal cancer Neg Hx    Rectal cancer Neg Hx    Stomach cancer Neg Hx     Social History   Socioeconomic History   Marital status: Widowed    Spouse name: Not on file   Number of children: Not on file   Years of education: Not on file   Highest education level: Not on file  Occupational History   Not on file  Tobacco Use   Smoking status: Never   Smokeless tobacco: Never  Vaping Use   Vaping status: Never Used  Substance and Sexual Activity   Alcohol use: No  Comment: Wine once or twice a year   Drug use: No   Sexual activity: Not Currently    Birth control/protection: Surgical    Comment: Hysterectomy  Other Topics Concern   Not on file  Social History Narrative   Not on file   Social Determinants of Health   Financial Resource Strain: Low Risk  (12/19/2022)   Overall Financial Resource Strain (CARDIA)    Difficulty of Paying Living Expenses: Not hard at all  Food Insecurity: Low Risk  (06/17/2023)   [facility]   Hunger Vital Sign    Worried About Running Out of Food in the Last Year: Never true    Ran Out of Food in the Last Year: Never true  Transportation Needs: No Transportation Needs (06/17/2023)   [facility]   Transportation    In the past 12 months, has lack of reliable transportation kept you from medical appointments, meetings, work or from getting things needed for daily living? : No  Physical Activity: Insufficiently Active (12/19/2022)   Exercise Vital Sign    Days of Exercise per Week: 3 days    Minutes of Exercise per Session: 10 min  Stress: No Stress Concern Present (12/19/2022)   [employer] of Occupational Health - Occupational Stress Questionnaire    Feeling of Stress : Not at all  Social Connections: Moderately Integrated (12/19/2022)   Social Connection and Isolation Panel [NHANES]    Frequency of Communication with Friends and Family: More  than three times a week    Frequency of Social Gatherings with Friends and Family: Three times a week    Attends Religious Services: More than 4 times per year    Active Member of Clubs or Organizations: Yes    Attends Banker Meetings: More than 4 times per year    Marital Status: Widowed  Intimate Partner Violence: Not At Risk (12/19/2022)   Humiliation, Afraid, Rape, and Kick questionnaire    Fear of Current or Ex-Partner: No    Emotionally Abused: No    Physically Abused: No    Sexually Abused: No    Review of Systems:  All other review of systems negative except as mentioned in the HPI.  Physical Exam: Vital signs BP (!) 140/69   Pulse 100   Temp 98.4 F (36.9 C)   Resp 15   Ht 5\' 3"  (1.6 m)   Wt 178 lb (80.7 kg)   SpO2 98%   BMI 31.53 kg/m   General:   Alert,  Well-developed, well-nourished, pleasant and cooperative in NAD Lungs:  Clear throughout to auscultation.   Heart:  Regular rate and rhythm; no murmurs, clicks, rubs,  or gallops. Abdomen:  Soft, nontender and nondistended. Normal bowel sounds.   Neuro/Psych:  Alert and cooperative. Normal mood and affect. A and O x 3   @Jaxsyn Azam  Sena Slate, MD, [facility] [phone] (pager) 09/18/2023 10:44 AM@

## 2023-09-18 NOTE — Op Note (Signed)
[facility] Patient Name: Lisa [name] Procedure Date: 09/18/2023 10:31 AM MRN: [id number] Endoscopist: Iva Boop , MD, [id number] Age: 82 Referring MD:  Date of Birth: 07/11/1941 Gender: Female Account #: [id number] Procedure:                Colonoscopy Indications:              Surveillance: Personal history of adenomatous                            polyps on last colonoscopy 5 years ago Medicines:                Monitored Anesthesia Care Procedure:                Pre-Anesthesia Assessment:                           - Prior to the procedure, a History and Physical                            was performed, and patient medications and                            allergies were reviewed. The patient's tolerance of                            previous anesthesia was also reviewed. The risks                            and benefits of the procedure and the sedation                            options and risks were discussed with the patient.                            All questions were answered, and informed consent                            was obtained. Prior Anticoagulants: The patient has                            taken no anticoagulant or antiplatelet agents. ASA                            Grade Assessment: III - A patient with severe                            systemic disease. After reviewing the risks and                            benefits, the patient was deemed in satisfactory                            condition to undergo the procedure.  After obtaining informed consent, the colonoscope                            was passed under direct vision. Throughout the                            procedure, the patient's blood pressure, pulse, and                            oxygen saturations were monitored continuously. The                            CF [id number] #[id number] was introduced through the anus                            and advanced  to the the cecum, identified by                            appendiceal orifice and ileocecal valve. The                            colonoscopy was somewhat difficult due to                            significant looping. Successful completion of the                            procedure was aided by applying abdominal pressure.                            The patient tolerated the procedure well. The                            quality of the bowel preparation was good. The                            ileocecal valve, appendiceal orifice, and rectum                            were photographed. Scope In: 10:52:51 AM Scope Out: 11:03:22 AM Scope Withdrawal Time: 0 hours 4 minutes 25 seconds  Total Procedure Duration: 0 hours 10 minutes 31 seconds  Findings:                 The perianal and digital rectal examinations were                            normal.                           Multiple diverticula were found in the sigmoid                            colon.  A post polypectomy scar was found in the cecum.                           The exam was otherwise without abnormality on                            direct and retroflexion views. Complications:            No immediate complications. Estimated Blood Loss:     Estimated blood loss: none. Impression:               - Diverticulosis in the sigmoid colon.                           - Post-polypectomy scar in the cecum.                           - The examination was otherwise normal on direct                            and retroflexion views.                           - No specimens collected. Recommendation:           - Patient has a contact number available for                            emergencies. The signs and symptoms of potential                            delayed complications were discussed with the                            patient. Return to normal activities tomorrow.                            Written  discharge instructions were provided to the                            patient.                           - Resume previous diet.                           - Continue present medications.                           - No repeat colonoscopy due to age and the absence                            of colonic polyps. Iva Boop, MD 09/18/2023 11:09:48 AM This report has been signed electronically.

## 2023-09-18 NOTE — Progress Notes (Signed)
Pt's states no medical or surgical changes since previsit or office visit. 

## 2023-09-18 NOTE — Progress Notes (Signed)
Vss nad trans to pacu 

## 2023-09-18 NOTE — Patient Instructions (Addendum)
Handout provided about diverticulosis.   Resume previous diet.   Continue present medications. No repeat colonoscopy due to age and the absence of colonic polyps.   YOU HAD AN ENDOSCOPIC PROCEDURE TODAY AT THE [facility]:   Refer to the procedure report that was given to you for any specific questions about what was found during the examination.  If the procedure report does not answer your questions, please call your gastroenterologist to clarify.  If you requested that your care partner not be given the details of your procedure findings, then the procedure report has been included in a sealed envelope for you to review at your convenience later.  YOU SHOULD EXPECT: Some feelings of bloating in the abdomen. Passage of more gas than usual.  Walking can help get rid of the air that was put into your GI tract during the procedure and reduce the bloating. If you had a lower endoscopy (such as a colonoscopy or flexible sigmoidoscopy) you may notice spotting of blood in your stool or on the toilet paper. If you underwent a bowel prep for your procedure, you may not have a normal bowel movement for a few days.  Please Note:  You might notice some irritation and congestion in your nose or some drainage.  This is from the oxygen used during your procedure.  There is no need for concern and it should clear up in a day or so.  SYMPTOMS TO REPORT IMMEDIATELY:  Following lower endoscopy (colonoscopy or flexible sigmoidoscopy):  Excessive amounts of blood in the stool  Significant tenderness or worsening of abdominal pains  Swelling of the abdomen that is new, acute  Fever of 100F or higher   For urgent or emergent issues, a gastroenterologist can be reached at any hour by calling [phone]. Do not use MyChart messaging for urgent concerns.    DIET:  We do recommend a small meal at first, but then you may proceed to your regular diet.  Drink plenty of fluids but you should avoid  alcoholic beverages for 24 hours.  ACTIVITY:  You should plan to take it easy for the rest of today and you should NOT DRIVE or use heavy machinery until tomorrow (because of the sedation medicines used during the test).    FOLLOW UP: Our staff will call the number listed on your records the next business day following your procedure.  We will call around 7:15- 8:00 am to check on you and address any questions or concerns that you may have regarding the information given to you following your procedure. If we do not reach you, we will leave a message.     If any biopsies were taken you will be contacted by phone or by letter within the next 1-3 weeks.  Please call us at [phone] if you have not heard about the biopsies in 3 weeks.    SIGNATURES/CONFIDENTIALITY: You and/or your care partner have signed paperwork which will be entered into your electronic medical record.  These signatures attest to the fact that that the information above on your After Visit Summary has been reviewed and is understood.  Full responsibility of the confidentiality of this discharge information lies with you and/or your care-partner.No polyps found. You do have diverticulosis - thickened muscle rings and pouches in the colon wall. Please read the handout about this condition.  You should stop routine colonoscopy testing.  I appreciate the opportunity to care for you. Iva Boop, MD, [facility]

## 2023-09-22 ENCOUNTER — Telehealth: Payer: Self-pay | Admitting: *Deleted

## 2023-09-22 NOTE — Telephone Encounter (Signed)
  Follow up Call-     09/18/2023    9:46 AM  Call back number  Post procedure Call Back phone  # [phone]  Permission to leave phone message Yes     Patient questions:  Do you have a fever, pain , or abdominal swelling? No. Pain Score  0 *  Have you tolerated food without any problems? Yes.    Have you been able to return to your normal activities? Yes.    Do you have any questions about your discharge instructions: Diet   No. Medications  No. Follow up visit  No.  Do you have questions or concerns about your Care? No.  Actions: * If pain score is 4 or above: No action needed, pain <4.

## 2023-10-06 ENCOUNTER — Other Ambulatory Visit (HOSPITAL_BASED_OUTPATIENT_CLINIC_OR_DEPARTMENT_OTHER): Payer: Self-pay

## 2023-10-06 MED ORDER — COVID-19 MRNA VAC-TRIS(PFIZER) 30 MCG/0.3ML IM SUSY
0.3000 mL | PREFILLED_SYRINGE | Freq: Once | INTRAMUSCULAR | 0 refills | Status: AC
  Filled 2023-10-06: qty 0.3, 1d supply, fill #0

## 2023-10-24 ENCOUNTER — Other Ambulatory Visit (HOSPITAL_BASED_OUTPATIENT_CLINIC_OR_DEPARTMENT_OTHER): Payer: Self-pay

## 2023-10-24 MED ORDER — INFLUENZA VAC A&B SURF ANT ADJ 0.5 ML IM SUSY
0.5000 mL | PREFILLED_SYRINGE | Freq: Once | INTRAMUSCULAR | 0 refills | Status: AC
  Filled 2023-10-24: qty 0.5, 1d supply, fill #0

## 2023-11-05 DIAGNOSIS — J301 Allergic rhinitis due to pollen: Secondary | ICD-10-CM | POA: Diagnosis not present

## 2023-11-05 DIAGNOSIS — J45991 Cough variant asthma: Secondary | ICD-10-CM | POA: Diagnosis not present

## 2023-11-05 DIAGNOSIS — J3081 Allergic rhinitis due to animal (cat) (dog) hair and dander: Secondary | ICD-10-CM | POA: Diagnosis not present

## 2023-11-05 DIAGNOSIS — J3089 Other allergic rhinitis: Secondary | ICD-10-CM | POA: Diagnosis not present

## 2023-11-07 ENCOUNTER — Encounter: Payer: Self-pay | Admitting: Podiatry

## 2023-11-07 ENCOUNTER — Ambulatory Visit (INDEPENDENT_AMBULATORY_CARE_PROVIDER_SITE_OTHER): Payer: Medicare Other | Admitting: Podiatry

## 2023-11-07 DIAGNOSIS — M79674 Pain in right toe(s): Secondary | ICD-10-CM

## 2023-11-07 DIAGNOSIS — I739 Peripheral vascular disease, unspecified: Secondary | ICD-10-CM | POA: Diagnosis not present

## 2023-11-07 DIAGNOSIS — B351 Tinea unguium: Secondary | ICD-10-CM

## 2023-11-07 DIAGNOSIS — M79675 Pain in left toe(s): Secondary | ICD-10-CM

## 2023-11-07 DIAGNOSIS — L84 Corns and callosities: Secondary | ICD-10-CM

## 2023-11-07 NOTE — Progress Notes (Signed)
  Subjective:  [name], female    DOB: April 10, 1941,  MRN: [id number]  Chief Complaint  Patient presents with   Routine Post Op    PATIENT STATES SHE NEED HER TOE NAILS CUT AND AND SHAVE DOWN , PATIENT STATES THAT SHE HAS NOT BEEN IN ANY PAIN . PATIENT HAS A CALLOUSES ON THE BOTTOM LF THAT SHE WOULD LIKE DOCTOR TO LOOK AT .     82 y.o. female presents with the above complaint. History confirmed with patient. Patient presenting with pain related to dystrophic thickened elongated nails. Patient is unable to trim own nails related to nail dystrophy and/or mobility issues. Patient does not have a history of T2DM. Patient does have callus present located at the dorsolateral aspect of the right foot 5th PIPJ causing pain.  Also has hyperkeratotic lesion plantar medial midfoot  Objective:  Physical Exam: warm, good capillary refill nail exam onychomycosis of the toenails, onycholysis, and dystrophic nails Mild xerosis of the plantar foot, discoloration medial ankle left side concerning for possible tinea pedis.  DP pulses palpable, PT pulses palpable, and protective sensation intact Left Foot:  Pain with palpation of nails due to elongation and dystrophic growth.  Hyperkeratotic lesion plantar medial midfoot of the left foot Right Foot: Pain with palpation of nails due to elongation and dystrophic growth. Hyperkeratotic lesion present dorsal lateral aspect fifth toe proximal interphalangeal joint related to adductovarus toe deformity pain with palpation of this area.  Assessment:   1. Pre-ulcerative calluses   2. Pain due to onychomycosis of toenails of both feet   3. PVD (peripheral vascular disease) (HCC)     Plan:  Patient was evaluated and treated and all questions answered.  #Hyperkeratotic lesions/pre ulcerative calluses present dorsolateral aspect right 5th toe All symptomatic hyperkeratoses x 2 separate lesions were safely debrided with a sterile #10 blade to  patient's level of comfort without incident. We discussed preventative and palliative care of these lesions including supportive and accommodative shoegear, padding, prefabricated and custom molded accommodative orthoses, use of a pumice stone and lotions/creams daily.  #Onychomycosis with pain  -Nails palliatively debrided as below. -Educated on self-care  Procedure: Nail Debridement Rationale: Pain Type of Debridement: manual, sharp debridement. Instrumentation: Nail nipper, rotary burr. Number of Nails: 10  Return in about 3 months (around 02/07/2024) for RFC.         Corinna Gab, DPM [facility] / [facility]

## 2023-11-20 ENCOUNTER — Ambulatory Visit: Payer: Medicare Other | Admitting: Podiatry

## 2023-12-15 ENCOUNTER — Encounter (HOSPITAL_BASED_OUTPATIENT_CLINIC_OR_DEPARTMENT_OTHER): Payer: Self-pay | Admitting: Cardiology

## 2023-12-15 ENCOUNTER — Ambulatory Visit (HOSPITAL_BASED_OUTPATIENT_CLINIC_OR_DEPARTMENT_OTHER): Payer: Medicare Other | Admitting: Cardiology

## 2023-12-15 ENCOUNTER — Telehealth: Payer: Self-pay | Admitting: Internal Medicine

## 2023-12-15 VITALS — BP 144/78 | HR 111 | Ht 63.75 in | Wt 181.1 lb

## 2023-12-15 DIAGNOSIS — Z7182 Exercise counseling: Secondary | ICD-10-CM | POA: Diagnosis not present

## 2023-12-15 DIAGNOSIS — I10 Essential (primary) hypertension: Secondary | ICD-10-CM | POA: Diagnosis not present

## 2023-12-15 DIAGNOSIS — E7800 Pure hypercholesterolemia, unspecified: Secondary | ICD-10-CM

## 2023-12-15 DIAGNOSIS — R000 Tachycardia, unspecified: Secondary | ICD-10-CM | POA: Diagnosis not present

## 2023-12-15 DIAGNOSIS — R600 Localized edema: Secondary | ICD-10-CM

## 2023-12-15 DIAGNOSIS — I5032 Chronic diastolic (congestive) heart failure: Secondary | ICD-10-CM

## 2023-12-15 NOTE — Progress Notes (Signed)
Cardiology Office Note:  .   Date:  12/15/2023  ID:  Lisa [name], Lisa [name] [birth year], MRN [id number] PCP: Margaree Mackintosh, MD  [facility] Providers Cardiologist:  Jodelle Red, MD {  History of Present Illness: .   Lisa [name] is a 82 y.o. female with a hx of asthma, hyperlipidemia, hypertension, obesity, arthritis, cervical cancer, and goiter, who is seen for follow up today. I initially met her 10/23/21 as a new consult at the request of Baxley, Luanna Cole, MD for the evaluation and management of tachycardia.   Cardiovascular history: Prior clinical ASCVD: None Comorbid conditions: hypertension, hyperlipidemia, obesity Family history: mother and father had hypertension, father had heart disease Prior workup: echo 2022 with EF 60-65, G2DD, RV normal, mild-moderate eccentric MR (not well seen)  Today: Here with her sister today.  Overall doing well. Blood pressure usually high on initial check. She has not felt any high blood pressure symptoms. Her numbers have been good when checked recently at the dentist.  Swelling has been stable. Has been wearing diabetic socks by accident, going back to compression socks. Notes that swelling improves with elevation. No shortness of breath, hasn't used rescue inhaler recently. Working to increase her walking in her condo, with her rollator. Discussed habits that she has been successful with in the past. Discussed chair exercises, using pedal bike while sitting.   Weights have been stable. Makes good urine on 20 mg lasix twice a day.   ROS: Denies chest pain, shortness of breath at rest or with normal exertion. No PND, orthopnea, or unexpected weight gain. No syncope or palpitations. ROS otherwise negative except as noted.   Studies Reviewed: Marland Kitchen    EKG:       Physical Exam:   VS:  BP (!) 144/78 (BP Location: Left Arm, Patient Position: Sitting, Cuff Size: Large)   Pulse (!) 111   Ht 5' 3.75" (1.619 m)   Wt 181 lb 1.6 oz  (82.1 kg)   SpO2 95%   BMI 31.33 kg/m    Wt Readings from Last 3 Encounters:  12/15/23 181 lb 1.6 oz (82.1 kg)  09/18/23 178 lb (80.7 kg)  09/03/23 178 lb (80.7 kg)    GEN: Well nourished, well developed in no acute distress HEENT: Normal, moist mucous membranes NECK: No JVD CARDIAC: regular rhythm, normal S1 and S2, no rubs or gallops. No murmur. VASCULAR: Radial and DP pulses 2+ bilaterally. No carotid bruits RESPIRATORY:  Clear to auscultation without rales, wheezing or rhonchi  ABDOMEN: Soft, non-tender, non-distended MUSCULOSKELETAL:  Ambulates independently SKIN: Warm and dry, R>L bilateral pitting LE 2+edema worst at ankles NEUROLOGIC:  Alert and oriented x 3. No focal neuro deficits noted. PSYCHIATRIC:  Normal affect    ASSESSMENT AND PLAN: .    Bilateral LE edema: -stable on BID lasix, continue -suspect components of both chronic diastolic heart failure and chronic venous insufficiency -echo without high risk findings -reviewed instructions on daily weights, salt avoidance, etc -encouraged activity as tolerated -discussed SGLT2i, declines -discussed compression, elevation. Doing well with compression stockings.   Hypertension -recheck improved but not at typical baseline -discussed checking BP at home -continue valsartan 80 mg BID, furosemide 20 mg bid -was on amlodipine prior, did not significantly change swelling, if BP remains elevated could consider adding back   Hypercholesterolemia -was on rosuvastatin 5 mg 3x/week previously, did not tolerate -tolerating ezetimibe -she has a very elevated ASCVD risk score   Sinus tachycardia -HR improves with resting. Increase activity  as tolerated.  CV risk counseling and prevention -recommend heart healthy/Mediterranean diet, with whole grains, fruits, vegetable, fish, lean meats, nuts, and olive oil. Limit salt. -recommend moderate walking, 3-5 times/week for 30-50 minutes each session. Aim for at least 150  minutes.week. Goal should be pace of 3 miles/hours, or walking 1.5 miles in 30 minutes -recommend avoidance of tobacco products. Avoid excess alcohol. -ASCVD risk score: The ASCVD Risk score (Arnett DK, et al., 2019) failed to calculate for the following reasons:   The 2019 ASCVD risk score is only valid for ages 42 to 64    Dispo: 6 mos  Signed, Jodelle Red, MD   Jodelle Red, MD, PhD, [facility]  [facility]  [place]  [facility] at [facility] [address], [address] [place], [place] [address] [phone]

## 2023-12-15 NOTE — Telephone Encounter (Signed)
Called patient back and she is going to drop off form to be filled out in the morning and I will call her when it is complete.

## 2023-12-15 NOTE — Telephone Encounter (Signed)
Copied from CRM [id number]. Topic: Clinical - Request for Lab/Test Order >> Dec 15, 2023  2:37 PM Dennison Nancy wrote: Reason for CRM: patient request to renew her handicap placard it has expired and need Marlan Palau to signed . Patient call back # [phone]

## 2023-12-15 NOTE — Patient Instructions (Addendum)
Medication Instructions:  Your physician recommends that you continue on your current medications as directed. Please refer to the Current Medication list given to you today.  Labwork: NONE  Testing/Procedures: NONE  Follow-Up: 6 MONTHS   Any Other Special Instructions Will Be Listed Below (If Applicable).  BP today is 144/78. Goal is <130/80. If you can work on gradually increasing activity, I think this will improve.  how to check blood pressure:  -sit comfortably in a chair, feet uncrossed and flat on floor, for 5-10 minutes  -arm ideally should rest at the level of the heart. However, arm should be relaxed and not tense (for example, do not hold the arm up unsupported)  -avoid exercise, caffeine, and tobacco for at least 30 minutes prior to BP reading  -don't take BP cuff reading over clothes (always place on skin directly)  -I prefer to know how well the medication is working, so I would like you to take your readings 1-2 hours after taking your blood pressure medication if possible

## 2023-12-16 DIAGNOSIS — Z0289 Encounter for other administrative examinations: Secondary | ICD-10-CM

## 2023-12-16 NOTE — Telephone Encounter (Signed)
Lisa [name] called and said her sister was going to drop off DMV form.

## 2023-12-16 NOTE — Telephone Encounter (Signed)
Called and left message for patient that the form is ready for pick up.

## 2023-12-17 NOTE — Telephone Encounter (Signed)
Patient picked up.

## 2024-01-15 LAB — HM MAMMOGRAPHY

## 2024-01-16 ENCOUNTER — Encounter: Payer: Self-pay | Admitting: Internal Medicine

## 2024-01-20 ENCOUNTER — Encounter: Payer: Self-pay | Admitting: Podiatry

## 2024-01-20 ENCOUNTER — Encounter: Payer: Self-pay | Admitting: Internal Medicine

## 2024-01-20 ENCOUNTER — Ambulatory Visit (INDEPENDENT_AMBULATORY_CARE_PROVIDER_SITE_OTHER): Payer: Medicare PPO | Admitting: Podiatry

## 2024-01-20 VITALS — Ht 63.75 in | Wt 181.0 lb

## 2024-01-20 DIAGNOSIS — L84 Corns and callosities: Secondary | ICD-10-CM | POA: Diagnosis not present

## 2024-01-20 DIAGNOSIS — M79674 Pain in right toe(s): Secondary | ICD-10-CM | POA: Diagnosis not present

## 2024-01-20 DIAGNOSIS — E1151 Type 2 diabetes mellitus with diabetic peripheral angiopathy without gangrene: Secondary | ICD-10-CM

## 2024-01-20 DIAGNOSIS — B351 Tinea unguium: Secondary | ICD-10-CM

## 2024-01-20 DIAGNOSIS — M79675 Pain in left toe(s): Secondary | ICD-10-CM | POA: Diagnosis not present

## 2024-01-20 DIAGNOSIS — I739 Peripheral vascular disease, unspecified: Secondary | ICD-10-CM

## 2024-01-20 NOTE — Progress Notes (Unsigned)
    Subjective:  [name], female    DOB: 25-Feb-1941,  MRN: [id number]  Lisa [name] presents to clinic today for:  Chief Complaint  Patient presents with   Nail Problem    Patient is here for routine foot care   Patient notes nails are thick and elongated, causing pain in shoe gear when ambulating.  She has a painful corn on the right 5th toe.  She uses a walker to assist with ambulation.   PCP is Baxley, Luanna Cole, MD.  Last seen around 12/15/23.    Past Medical History:  Diagnosis Date   Allergy    Arthritis    Asthma    Cancer (HCC)    cervical   Complication of anesthesia    Difficulty waking up after hysterectomy in 1973   Goiter    Headache    Sinus headaches   Hyperlipidemia    Hypertension    Obesity    Osteopenia    Vitamin D deficiency     Allergies  Allergen Reactions   Grass Pollen(K-O-R-T-Swt Vern) Itching and Shortness Of Breath   Penicillins Other (See Comments)    Chest pain 12 years   Pollen Extract Cough, Itching and Shortness Of Breath   Codeine Other (See Comments)    Hard time waking-up   Darvon Nausea And Vomiting   Tall Ragweed Itching and Other (See Comments)    Seasonal  Congestion/ watery eyes/ Sneezing and chest congestion, itching eyes and ears Trees Grass    Objective:  Lisa [name] is a pleasant 83 y.o. female in NAD. AAO x 3.  Vascular Examination: Patient has palpable DP pulse, absent PT pulse bilateral.  Delayed capillary refill bilateral toes.  Sparse digital hair bilateral.  Proximal to distal cooling WNL bilateral.    Dermatological Examination: Interspaces are clear with no open lesions noted bilateral.  Skin is shiny and atrophic bilateral.  Nails are 3-69mm thick, with yellowish/brown discoloration, subungual debris and distal onycholysis x10.  There is pain with compression of nails x10.  There are hyperkeratotic lesions noted on the dorsolateral aspect of the right 5th toe PIPJ .  Patient  qualifies for at-risk foot care because of diabetes with PVD .  Assessment/Plan: 1. Pain due to onychomycosis of toenails of both feet   2. Pre-ulcerative calluses   3. Type II diabetes mellitus with peripheral circulatory disorder (HCC)     Mycotic nails x10 were sharply debrided with sterile nail nippers and power debriding burr to decrease bulk and length.  Hyperkeratotic lesion on PIPJ of right 5th toe was shaved with #312 blade.  Return in about 3 months (around 04/19/2024) for [facility] (soak in 3-WEA).   Clerance Lav, DPM, FACFAS [facility]     [address] [place], [place] [address]                Office [phone]  Fax [phone]

## 2024-01-21 ENCOUNTER — Ambulatory Visit: Payer: Federal, State, Local not specified - PPO | Admitting: Podiatry

## 2024-02-09 ENCOUNTER — Ambulatory Visit: Payer: Medicare Other | Admitting: Podiatry

## 2024-02-12 ENCOUNTER — Ambulatory Visit: Payer: Medicare Other | Admitting: Podiatry

## 2024-02-13 ENCOUNTER — Other Ambulatory Visit: Payer: Self-pay

## 2024-02-13 DIAGNOSIS — I10 Essential (primary) hypertension: Secondary | ICD-10-CM

## 2024-02-13 DIAGNOSIS — E7800 Pure hypercholesterolemia, unspecified: Secondary | ICD-10-CM

## 2024-02-13 MED ORDER — AMLODIPINE BESYLATE 5 MG PO TABS: 5.0000 mg | ORAL_TABLET | Freq: Every day | ORAL | 3 refills | Status: DC

## 2024-02-13 MED ORDER — EZETIMIBE 10 MG PO TABS
10.0000 mg | ORAL_TABLET | Freq: Every day | ORAL | 3 refills | Status: AC
Start: 2024-02-13 — End: ?

## 2024-02-25 ENCOUNTER — Telehealth: Payer: Self-pay | Admitting: Podiatry

## 2024-02-25 NOTE — Telephone Encounter (Signed)
 Pt states that she will like speak with you regarding her bill, the billing process and insurance. Stated that she was given your name and number yesterday to speak to. When trying to direct her to billing department , she stated "No that she wanted to speak with you and it was nothing bad. " Informed her that I will send you the message and asked that you call her back per request and if needed you can further redirect her. TY

## 2024-04-20 ENCOUNTER — Ambulatory Visit (INDEPENDENT_AMBULATORY_CARE_PROVIDER_SITE_OTHER): Payer: Federal, State, Local not specified - PPO | Admitting: Podiatry

## 2024-04-20 ENCOUNTER — Encounter: Payer: Self-pay | Admitting: Podiatry

## 2024-04-20 DIAGNOSIS — M79674 Pain in right toe(s): Secondary | ICD-10-CM | POA: Diagnosis not present

## 2024-04-20 DIAGNOSIS — B351 Tinea unguium: Secondary | ICD-10-CM | POA: Diagnosis not present

## 2024-04-20 DIAGNOSIS — E1151 Type 2 diabetes mellitus with diabetic peripheral angiopathy without gangrene: Secondary | ICD-10-CM

## 2024-04-20 DIAGNOSIS — L84 Corns and callosities: Secondary | ICD-10-CM | POA: Diagnosis not present

## 2024-04-20 DIAGNOSIS — M79675 Pain in left toe(s): Secondary | ICD-10-CM | POA: Diagnosis not present

## 2024-04-20 NOTE — Progress Notes (Signed)
       Subjective:  [name], female    DOB: [birth year],  MRN: [id number]  Lisa [name] presents to clinic today for:  Chief Complaint  Patient presents with   Diabetes    Patient is here for [facility]   Patient notes nails are thick and elongated, causing pain in shoe gear when ambulating.  She has a corn on the right fifth toe.  She has her compression stockings on today.  She does not want the hallux nails trimmed of length today but does want them burred down to decrease thickness  PCP is Baxley, Jaynie Meyers, MD. last seen around 01/22/2024  Past Medical History:  Diagnosis Date   Allergy    Arthritis    Asthma    Cancer (HCC)    cervical   Complication of anesthesia    Difficulty waking up after hysterectomy in 1973   Goiter    Headache    Sinus headaches   Hyperlipidemia    Hypertension    Obesity    Osteopenia    Vitamin D  deficiency     Allergies  Allergen Reactions   Grass Pollen(K-O-R-T-Swt Vern) Itching and Shortness Of Breath   Penicillins Other (See Comments)    Chest pain 12 years   Pollen Extract Cough, Itching and Shortness Of Breath   Codeine Other (See Comments)    Hard time waking-up   Darvon Nausea And Vomiting   Tall Ragweed Itching and Other (See Comments)    Seasonal  Congestion/ watery eyes/ Sneezing and chest congestion, itching eyes and ears Trees Grass     Objective:  Lisa [name] is a pleasant 83 y.o. female in NAD. AAO x 3.  Vascular Examination: Patient has palpable DP pulse, absent PT pulse bilateral.  Delayed capillary refill bilateral toes.  Sparse digital hair bilateral.  Proximal to distal cooling WNL bilateral.    Dermatological Examination: Interspaces are clear with no open lesions noted bilateral.  Skin is shiny and atrophic bilateral.  Nails are 3-70mm thick, with yellowish/brown discoloration, subungual debris and distal onycholysis x10.  There is pain with compression of nails x10.  There are  hyperkeratotic lesions noted on the dorsal lateral aspect of the right fifth toe PIP joint.  Patient qualifies for at-risk foot care because of diabetes with PVD.  Assessment/Plan: 1. Pain due to onychomycosis of toenails of both feet   2. Pre-ulcerative calluses   3. Type II diabetes mellitus with peripheral circulatory disorder (HCC)    Mycotic nails x10 were sharply debrided with sterile nail nippers and power debriding burr to decrease bulk and length.  Hyperkeratotic lesion on right fifth toe PIPJ was shaved with #312 blade.   Return in about 9 weeks (around 06/22/2024) for [facility].   Joe Murders, DPM, FACFAS [facility]     [address] [place], [place] [address]                Office [phone]  Fax [phone]

## 2024-04-26 ENCOUNTER — Telehealth: Payer: Self-pay | Admitting: Podiatry

## 2024-04-26 NOTE — Telephone Encounter (Signed)
 Patient is [name] patient and would like to speak with you due to you being on call for Dr. Celia Coles. Please contact patient at [phone], Patient did not wish to give any information to anyone but the provider.

## 2024-05-05 ENCOUNTER — Other Ambulatory Visit (HOSPITAL_BASED_OUTPATIENT_CLINIC_OR_DEPARTMENT_OTHER): Payer: Self-pay | Admitting: Cardiology

## 2024-05-05 DIAGNOSIS — I10 Essential (primary) hypertension: Secondary | ICD-10-CM

## 2024-06-04 ENCOUNTER — Telehealth: Payer: Self-pay | Admitting: Internal Medicine

## 2024-06-04 NOTE — Telephone Encounter (Signed)
 Patient called wanting to inquire if there are anymore covid vaccines or boosters coming out and if they are if Dr Liane Redman recommends any. Please advise

## 2024-06-22 ENCOUNTER — Ambulatory Visit (INDEPENDENT_AMBULATORY_CARE_PROVIDER_SITE_OTHER): Admitting: Podiatry

## 2024-06-22 ENCOUNTER — Encounter: Payer: Self-pay | Admitting: Podiatry

## 2024-06-22 DIAGNOSIS — E1151 Type 2 diabetes mellitus with diabetic peripheral angiopathy without gangrene: Secondary | ICD-10-CM

## 2024-06-22 DIAGNOSIS — M79675 Pain in left toe(s): Secondary | ICD-10-CM | POA: Diagnosis not present

## 2024-06-22 DIAGNOSIS — M79674 Pain in right toe(s): Secondary | ICD-10-CM | POA: Diagnosis not present

## 2024-06-22 DIAGNOSIS — B351 Tinea unguium: Secondary | ICD-10-CM

## 2024-06-22 DIAGNOSIS — L84 Corns and callosities: Secondary | ICD-10-CM

## 2024-06-22 NOTE — Progress Notes (Unsigned)
 Subjective:  [name], female    DOB: [birth year],  MRN: [id number]  Lisa [name] presents to clinic today for:  Chief Complaint  Patient presents with   rfc    Rm19 RFC not diabetic/Dr. Perri last    Patient notes nails are thick and elongated, causing pain in shoe gear when ambulating.  She has a corn on the right fifth toe.  She has her compression stockings on today.  She does not want the hallux nails trimmed of length today but does want them burred down to decrease thickness  PCP is Baxley, Ronal PARAS, MD. last seen around 06/04/2024  Past Medical History:  Diagnosis Date   Allergy    Arthritis    Asthma    Cancer (HCC)    cervical   Complication of anesthesia    Difficulty waking up after hysterectomy in 1973   Goiter    Headache    Sinus headaches   Hyperlipidemia    Hypertension    Obesity    Osteopenia    Vitamin D  deficiency     Allergies  Allergen Reactions   Grass Pollen(K-O-R-T-Swt Vern) Itching and Shortness Of Breath   Penicillins Other (See Comments)    Chest pain 12 years   Pollen Extract Cough, Itching and Shortness Of Breath   Codeine Other (See Comments)    Hard time waking-up   Darvon Nausea And Vomiting   Tall Ragweed Itching and Other (See Comments)    Seasonal  Congestion/ watery eyes/ Sneezing and chest congestion, itching eyes and ears Trees Grass     Objective:  Vascular Examination: Patient has palpable DP pulse, absent PT pulse bilateral.  Delayed capillary refill bilateral toes.  Sparse digital hair bilateral.  Proximal to distal cooling WNL bilateral.    Dermatological Examination: Interspaces are clear with no open lesions noted bilateral.  Skin is shiny and atrophic bilateral.  Nails are 3-81mm thick, with yellowish/brown discoloration, subungual debris and distal onycholysis x10.  There is pain with compression of nails x10.  There are hyperkeratotic lesions noted on the dorsal lateral aspect of the  right fifth toe PIP joint.  Patient qualifies for at-risk foot care because of diabetes with PVD.  Assessment/Plan: 1. Pain due to onychomycosis of toenails of both feet   2. Corns   3. Type II diabetes mellitus with peripheral circulatory disorder (HCC)    Mycotic nails x10 were sharply debrided with sterile nail nippers and power debriding burr to decrease bulk and length.  Hyperkeratotic lesion on right fifth toe PIPJ was shaved with #312 blade.  Return in about 9 weeks (around 08/24/2024) for [facility].  She requested to come more frequently than 9 weeks stating that she needs to have her nail and callus care performed more frequently, but she was informed that this will not be covered by insurance.  She stated that she may opt to pay out-of-pocket if she feels she needs it sooner.  At this point she was okay with going ahead and scheduling every 9 weeks.   Awanda CHARM Imperial, DPM, FACFAS [facility]     [address], [place] [address]  Office [phone]  Fax [phone]

## 2024-08-06 NOTE — Progress Notes (Signed)
 Patient Care Team: Perri Ronal PARAS, MD as PCP - General (Internal Medicine) Lonni Slain, MD as PCP - Cardiology (Cardiology)  Visit Date: 08/09/24  Subjective:   Chief Complaint  Patient presents with   Nasal Congestion    Patient went to urgent care on 08/02/2024, tested negative for COVID and flu. Patient coughing up clear.    Patient PI:Lisa [name], Lisa [name] DOB:12-15-1941,83 y.o. FMW:[id number]   83 y.o.Female presents today for UC follow-up from 08/02/2024 for Wheezing. Patient has a past medical history of Allergy/Asthma. Seen 08/02/2024 at [facility] for wheezing, sinus congestion, and acute cough. Covid-19 and Flu negative. Given Duoneb and prescribed Medrol  dosepak and OTC Mucinex. Denies expectoration, nasal congestion clear. Says that several of her friends with allergies have similar symptoms after being in an air conditioned room for several hours.  Past Medical History:  Diagnosis Date   Allergy    Arthritis    Asthma    Cancer (HCC)    cervical   Complication of anesthesia    Difficulty waking up after hysterectomy in 1973   Goiter    Headache    Sinus headaches   Hyperlipidemia    Hypertension    Obesity    Osteopenia    Vitamin D  deficiency     Allergies  Allergen Reactions   Grass Pollen(K-O-R-T-Swt Vern) Itching and Shortness Of Breath   Penicillins Other (See Comments)    Chest pain 12 years   Pollen Extract Cough, Itching and Shortness Of Breath   Codeine Other (See Comments)    Hard time waking-up   Darvon Nausea And Vomiting   Tall Ragweed Itching and Other (See Comments)    Seasonal  Congestion/ watery eyes/ Sneezing and chest congestion, itching eyes and ears Trees Grass    Immunization History  Administered Date(s) Administered   Fluad  Quad(high Dose 65+) 10/25/2022   Fluad  Trivalent(High Dose 65+) 10/24/2023   Influenza, High Dose Seasonal PF 11/05/2016   Influenza,inj,Quad PF,6+ Mos 11/09/2013, 11/28/2015,  12/25/2017, 11/20/2018, 09/20/2019, 09/28/2020, 11/05/2023   Influenza-Unspecified 11/05/2021   Moderna Sars-Covid-2 Vaccination 02/27/2020, 03/19/2020   PFIZER Comirnaty (Gray Top)Covid-19 Tri-Sucrose Vaccine 04/20/2021   PFIZER(Purple Top)SARS-COV-2 Vaccination 01/19/2020, 02/09/2020, 10/14/2020, 02/19/2022   Pfizer Covid-19 Vaccine Bivalent Booster 40yrs & up 11/13/2021   Pfizer(Comirnaty )Fall Seasonal Vaccine 12 years and older 10/07/2022, 10/06/2023   Pneumococcal Conjugate-13 12/07/2015   Pneumococcal Polysaccharide-23 01/15/2011, 02/19/2022, 11/05/2023   Respiratory Syncytial Virus Vaccine ,Recomb Aduvanted(Arexvy ) 11/18/2022   Tdap 08/12/1995, 03/18/2014   Zoster, Live 01/15/2011   Past Surgical History:  Procedure Laterality Date   ABDOMINAL HYSTERECTOMY     COLONOSCOPY     DENTAL SURGERY     EYE SURGERY     HAMMER TOE SURGERY  11/2001   right   TOTAL KNEE ARTHROPLASTY Left 08/21/2020   Procedure: TOTAL KNEE ARTHROPLASTY;  Surgeon: Melodi Lerner, MD;  Location: [facility];  Service: Orthopedics;  Laterality: Left;    WISDOM TOOTH EXTRACTION      Family History  Problem Relation Age of Onset   Colon polyps Mother    Hypertension Mother    Heart disease Father    Hypertension Father    Thyroid  disease Father    Colon polyps Sister    Colon cancer Neg Hx    Esophageal cancer Neg Hx    Rectal cancer Neg Hx    Stomach cancer Neg Hx    Social History   Social History Narrative   Not on file   Review of Systems  HENT:  Positive for congestion.   Respiratory:  Positive for wheezing. Negative for cough and sputum production.      Objective:  Vitals: BP 130/60   Pulse (!) 102   Ht 5' 3.75 (1.619 m)   Wt 181 lb (82.1 kg)   SpO2 95%   BMI 31.31 kg/m   Physical Exam Vitals and nursing note reviewed.  Constitutional:      General: She is not in acute distress.    Appearance: Normal appearance. She is not ill-appearing or toxic-appearing.     Comments:  Congested cough  HENT:     Head: Normocephalic and atraumatic.     Right Ear: Tympanic membrane, ear canal and external ear normal.     Left Ear: Tympanic membrane, ear canal and external ear normal.     Mouth/Throat:     Mouth: Mucous membranes are moist.     Pharynx: Oropharynx is clear. No oropharyngeal exudate or posterior oropharyngeal erythema.  Pulmonary:     Effort: Pulmonary effort is normal.     Breath sounds: Wheezing present. No rhonchi or rales.  Lymphadenopathy:     Cervical: No cervical adenopathy.  Skin:    General: Skin is warm and dry.  Neurological:     Mental Status: She is alert and oriented to person, place, and time. Mental status is at baseline.  Psychiatric:        Mood and Affect: Mood normal.        Behavior: Behavior normal.        Thought Content: Thought content normal.        Judgment: Judgment normal.     Results:  Studies Obtained And Personally Reviewed By Me:  XR Chest 2 Views (08/02/2024 3:33 PM EDT) Narrative  FINDINGS: Cardiovascular: Mild cardiomegaly. Upper mediastinal widening. Mediastinum: Within normal limits. Lungs/pleura: Elevated right hemidiaphragm. No suspicious airspace opacity. No pleural effusion or pneumothorax. Upper abdomen: Visualized portions are unremarkable. Chest wall/osseous structures: No acute osseous abnormality. Polyarticular degenerative changes. Impressions  1.  No acute cardiopulmonary abnormality.  2.  Widening of the upper mediastinum could be secondary to adenopathy, pulmonary vascular congestion, or mediastinal mass. Nonemergent CT could further evaluate.      Labs:  CBC w/ Differential Lab Results  Component Value Date   WBC 4.8 11/20/2021   RBC 5.13 (H) 11/20/2021   HGB 12.8 11/20/2021   HCT 40.9 11/20/2021   PLT 230 11/20/2021   MCV 79.7 (L) 11/20/2021   MCH 25.0 (L) 11/20/2021   MCHC 31.3 (L) 11/20/2021   RDW 14.7 11/20/2021   MPV 10.1 11/20/2021   LYMPHSABS 960 11/20/2021   MONOABS 294  01/14/2017   BASOSABS 19 11/20/2021    Comprehensive Metabolic Panel Lab Results  Component Value Date   NA 142 06/06/2023   K 4.2 06/06/2023   CL 106 06/06/2023   CO2 22 06/06/2023   GLUCOSE 104 (H) 06/06/2023   BUN 25 06/06/2023   CREATININE 1.03 (H) 06/06/2023   CALCIUM  9.7 06/06/2023   PROT 7.2 06/06/2023   ALBUMIN 4.3 06/06/2023   AST 22 06/06/2023   ALT 17 06/06/2023   ALKPHOS 119 06/06/2023   BILITOT 0.7 06/06/2023   EGFR 55 (L) 06/06/2023   GFRNONAA 49 (L) 11/16/2020   Lipid Panel  Lab Results  Component Value Date   CHOL 173 06/06/2023   HDL 72 06/06/2023   LDLCALC 92 06/06/2023   TRIG 44 06/06/2023   A1c Lab Results  Component Value Date   HGBA1C  5.8 (H) 05/28/2022    TSH Lab Results  Component Value Date   TSH 1.14 11/20/2021   Assessment & Plan:   Orders Placed This Encounter  Procedures   CBC with Differential/Platelet   Meds ordered this encounter  Medications   cefTRIAXone  (ROCEPHIN ) injection 1 g   methylPREDNISolone  acetate (DEPO-MEDROL ) injection 80 mg   methylPREDNISolone  (MEDROL ) 4 MG tablet    Sig: Take in tapering course as directed for bronchospasm 6-5-4-3-2-1    Dispense:  21 tablet    Refill:  0   azithromycin  (ZITHROMAX ) 250 MG tablet    Sig: Take 2 tablets on day 1, then 1 tablet daily on days 2 through 5    Dispense:  6 tablet    Refill:  0   fluconazole  (DIFLUCAN ) 150 MG tablet    Sig: One tablet by mouth if you develop Candida vaginitis while on antibiotics and steroids.    Dispense:  1 tablet    Refill:  0   Acute Lower Respiratory Infection; Acute Bronchospasm: seen 08/02/2024 at [facility] for wheezing, sinus congestion, and acute cough. Covid-19 and Flu negative. Given Duoneb and prescribed Medrol  dosepak and OTC Mucinex. Today, denies expectoration and says her nasal congestion is clear. Reportedly, several of her friends have similar symptoms.   Given Rocephin  1 g IM and Depo-Medrol  80 mg IM today in-office.  Sending in Azithromycin  250 mg - take 2 tablets on Day 1 and 1 tablet on Days 2-5, Medrol  tapering course, and 150 mg Diflucan  in case she develops Candida vaginitis. Ordering CBC w/ Diff.Stay well rested, well hydrated, and well nourished. Walk around some to prevent atelectasis. Contact us  if symptoms worsen/persist despite treatment.     I,Emily Lagle,acting as a Neurosurgeon for Ronal JINNY Hailstone, MD.,have documented all relevant documentation on the behalf of Ronal JINNY Hailstone, MD,as directed by  Ronal JINNY Hailstone, MD while in the presence of Ronal JINNY Hailstone, MD.  I, Ronal JINNY Hailstone, MD, have reviewed all documentation for this visit. The documentation on 08/09/2024 for the exam, diagnosis, procedures, and orders are all accurate and complete.

## 2024-08-09 ENCOUNTER — Encounter: Payer: Self-pay | Admitting: Internal Medicine

## 2024-08-09 ENCOUNTER — Ambulatory Visit (INDEPENDENT_AMBULATORY_CARE_PROVIDER_SITE_OTHER): Admitting: Internal Medicine

## 2024-08-09 VITALS — BP 130/60 | HR 102 | Ht 63.75 in | Wt 181.0 lb

## 2024-08-09 DIAGNOSIS — J9801 Acute bronchospasm: Secondary | ICD-10-CM

## 2024-08-09 DIAGNOSIS — R059 Cough, unspecified: Secondary | ICD-10-CM | POA: Diagnosis not present

## 2024-08-09 DIAGNOSIS — J22 Unspecified acute lower respiratory infection: Secondary | ICD-10-CM | POA: Diagnosis not present

## 2024-08-09 LAB — CBC WITH DIFFERENTIAL/PLATELET
Absolute Lymphocytes: 1056 {cells}/uL (ref 850–3900)
Absolute Monocytes: 570 {cells}/uL (ref 200–950)
Basophils Absolute: 30 {cells}/uL (ref 0–200)
Basophils Relative: 0.4 %
Eosinophils Absolute: 23 {cells}/uL (ref 15–500)
Eosinophils Relative: 0.3 %
HCT: 43 % (ref 35.0–45.0)
Hemoglobin: 13.4 g/dL (ref 11.7–15.5)
MCH: 25.4 pg — ABNORMAL LOW (ref 27.0–33.0)
MCHC: 31.2 g/dL — ABNORMAL LOW (ref 32.0–36.0)
MCV: 81.4 fL (ref 80.0–100.0)
MPV: 9.8 fL (ref 7.5–12.5)
Monocytes Relative: 7.5 %
Neutro Abs: 5920 {cells}/uL (ref 1500–7800)
Neutrophils Relative %: 77.9 %
Platelets: 223 Thousand/uL (ref 140–400)
RBC: 5.28 Million/uL — ABNORMAL HIGH (ref 3.80–5.10)
RDW: 15.2 % — ABNORMAL HIGH (ref 11.0–15.0)
Total Lymphocyte: 13.9 %
WBC: 7.6 Thousand/uL (ref 3.8–10.8)

## 2024-08-09 MED ORDER — METHYLPREDNISOLONE 4 MG PO TABS: ORAL_TABLET | ORAL | 0 refills | Status: DC

## 2024-08-09 MED ORDER — CEFTRIAXONE SODIUM 1 G IJ SOLR
1.0000 g | Freq: Once | INTRAMUSCULAR | Status: AC
  Administered 2024-08-09 (×2): 1 g via INTRAMUSCULAR

## 2024-08-09 MED ORDER — FLUCONAZOLE 150 MG PO TABS: ORAL_TABLET | ORAL | 0 refills | Status: DC

## 2024-08-09 MED ORDER — METHYLPREDNISOLONE ACETATE 80 MG/ML IJ SUSP
80.0000 mg | Freq: Once | INTRAMUSCULAR | Status: AC
  Administered 2024-08-09 (×2): 80 mg via INTRAMUSCULAR

## 2024-08-09 MED ORDER — AZITHROMYCIN 250 MG PO TABS: ORAL_TABLET | ORAL | 0 refills | Status: AC

## 2024-08-10 ENCOUNTER — Ambulatory Visit: Payer: Self-pay | Admitting: Internal Medicine

## 2024-08-12 ENCOUNTER — Encounter: Payer: Self-pay | Admitting: Internal Medicine

## 2024-08-12 NOTE — Patient Instructions (Addendum)
 We are sorry you are not feeling well today. I believe you have an acute lower respiratory tract infection. I have prescribed a Zithromax  Z pak to take 2 tabs Day 1 followed by one tab Days 2-5. Please take Medrol  4 mg tabs in tapering course as directed starting with 6 tabs day 1 decreasing by one tab days 2-5. May take Diflucan  150 mg tab if you develop a yeast infection while on this treatment. You received Rocephin  one gram IM (antibiotic) today.

## 2024-08-13 ENCOUNTER — Other Ambulatory Visit: Payer: Self-pay

## 2024-08-13 DIAGNOSIS — I10 Essential (primary) hypertension: Secondary | ICD-10-CM

## 2024-08-13 DIAGNOSIS — I5032 Chronic diastolic (congestive) heart failure: Secondary | ICD-10-CM

## 2024-08-13 DIAGNOSIS — R600 Localized edema: Secondary | ICD-10-CM

## 2024-08-13 MED ORDER — FUROSEMIDE 20 MG PO TABS: 20.0000 mg | ORAL_TABLET | Freq: Two times a day (BID) | ORAL | 1 refills | Status: AC

## 2024-08-23 ENCOUNTER — Ambulatory Visit: Admitting: Podiatry

## 2024-09-13 ENCOUNTER — Ambulatory Visit (INDEPENDENT_AMBULATORY_CARE_PROVIDER_SITE_OTHER): Admitting: Podiatry

## 2024-09-13 DIAGNOSIS — M79674 Pain in right toe(s): Secondary | ICD-10-CM

## 2024-09-13 DIAGNOSIS — L84 Corns and callosities: Secondary | ICD-10-CM

## 2024-09-13 DIAGNOSIS — M79675 Pain in left toe(s): Secondary | ICD-10-CM | POA: Diagnosis not present

## 2024-09-13 DIAGNOSIS — B351 Tinea unguium: Secondary | ICD-10-CM | POA: Diagnosis not present

## 2024-09-13 DIAGNOSIS — E1151 Type 2 diabetes mellitus with diabetic peripheral angiopathy without gangrene: Secondary | ICD-10-CM

## 2024-09-13 NOTE — Progress Notes (Signed)
    Subjective:  [name], female    DOB: 1941/03/05,  MRN: [id number]  Lisa [name] presents to clinic today for:  Chief Complaint  Patient presents with   [facility]    RFC Non diabetic toenail trim. 0 pain.    Patient notes nails are thick and elongated, causing pain in shoe gear when ambulating.  She is painful corn on the right fifth toe and a painful callus on the plantar aspect of the left heel.  PCP is Perri Ronal PARAS, MD. last seen on 08/09/2024  Past Medical History:  Diagnosis Date   Allergy    Arthritis    Asthma    Cancer (HCC)    cervical   Complication of anesthesia    Difficulty waking up after hysterectomy in 1973   Goiter    Headache    Sinus headaches   Hyperlipidemia    Hypertension    Obesity    Osteopenia    Vitamin D  deficiency    Allergies  Allergen Reactions   Grass Pollen(K-O-R-T-Swt Vern) Itching and Shortness Of Breath   Penicillins Other (See Comments)    Chest pain 12 years   Pollen Extract Cough, Itching and Shortness Of Breath   Codeine Other (See Comments)    Hard time waking-up   Darvon Nausea And Vomiting   Tall Ragweed Itching and Other (See Comments)    Seasonal  Congestion/ watery eyes/ Sneezing and chest congestion, itching eyes and ears Trees Grass    Objective:  Lisa [name] is a pleasant 83 y.o. female in NAD. AAO x 3.  Vascular Examination: Patient has palpable DP pulse, absent PT pulse bilateral.  Delayed capillary refill bilateral toes.  Sparse digital hair bilateral.  Proximal to distal cooling WNL bilateral.    Dermatological Examination: Interspaces are clear with no open lesions noted bilateral.  Skin is shiny and atrophic bilateral.  Nails are 3-1mm thick, with yellowish/brown discoloration, subungual debris and distal onycholysis x10.  There is pain with compression of nails x10.  There are hyperkeratotic lesions noted on the dorsal lateral aspect of the right fifth toe at the PIPJ and the  plantar aspect of the left heel..  Patient qualifies for at-risk foot care because of diabetes with PVD.  Assessment/Plan: 1. Pain due to onychomycosis of toenails of both feet   2. Corns   3. Type II diabetes mellitus with peripheral circulatory disorder (HCC)     Mycotic nails x10 were sharply debrided with sterile nail nippers and power debriding burr to decrease bulk and length.  Hyperkeratotic lesions on right fifth toe PIPJ and plantar left heel were sanded off/removed/debrided with a sanding bur.  Return in about 9 weeks (around 11/15/2024) for [facility] (nails and calluses).   Awanda CHARM Imperial, DPM, FACFAS [facility]     [address] [place], [place] [address]                Office [phone]  Fax [phone]

## 2024-09-15 ENCOUNTER — Ambulatory Visit (HOSPITAL_BASED_OUTPATIENT_CLINIC_OR_DEPARTMENT_OTHER): Admitting: Cardiology

## 2024-09-15 ENCOUNTER — Encounter (HOSPITAL_BASED_OUTPATIENT_CLINIC_OR_DEPARTMENT_OTHER): Payer: Self-pay | Admitting: Cardiology

## 2024-09-15 VITALS — BP 128/68 | HR 86 | Resp 17 | Ht 63.0 in | Wt 177.0 lb

## 2024-09-15 DIAGNOSIS — I10 Essential (primary) hypertension: Secondary | ICD-10-CM | POA: Diagnosis not present

## 2024-09-15 DIAGNOSIS — R000 Tachycardia, unspecified: Secondary | ICD-10-CM

## 2024-09-15 DIAGNOSIS — I5032 Chronic diastolic (congestive) heart failure: Secondary | ICD-10-CM

## 2024-09-15 DIAGNOSIS — E7800 Pure hypercholesterolemia, unspecified: Secondary | ICD-10-CM

## 2024-09-15 DIAGNOSIS — R600 Localized edema: Secondary | ICD-10-CM

## 2024-09-15 NOTE — Progress Notes (Signed)
 Cardiology Office Note:  .   Date:  09/15/2024  ID:  Desha, Bitner 03-14-1941, MRN [id number] PCP: Perri Ronal PARAS, MD  [facility] Providers Cardiologist:  Shelda Bruckner, MD {  History of Present Illness: .   Lisa [name] is a 83 y.o. female with a hx of asthma, hyperlipidemia, hypertension, obesity, arthritis, cervical cancer, and goiter, who is seen for follow up today. I initially met her 10/23/21 as a new consult at the request of Baxley, Ronal PARAS, MD for the evaluation and management of tachycardia.   Cardiovascular history: Prior clinical ASCVD: None Comorbid conditions: hypertension, hyperlipidemia, obesity Family history: mother and father had hypertension, father had heart disease Prior workup: echo 2022 with EF 60-65, G2DD, RV normal, mild-moderate eccentric MR (not well seen)  Today: Here with her sister today. About three weeks ago had congestion, cough. Couldn't get in to her PCP so went to urgent care, got a breathing treatment and mucinex. Saw her PCP a few days later to follow up, got antibiotics and steroids for bronchitis. No fever, no flu, no Covid. Cough now resolved, breathing is better. Allegra typically controls her seasonal allergies well.  Blood pressure was normal when she went to urgent care, was 130/60 at Dr. Vivia office.   Leg swelling continues to be worse R>L. Wears compression stockings regularly and elevates at night. Has fractured R ankle in remote past and has had worse swelling on that side chronically. Not worsened with 5 mg amlodipine . No shortness of breath, weight has been stable.   ROS: Denies chest pain, shortness of breath at rest or with normal exertion. No PND, orthopnea, or unexpected weight gain. No syncope or palpitations. ROS otherwise negative except as noted.   Studies Reviewed: SABRA    EKG:  EKG Interpretation Date/Time:  Wednesday September 15 2024 11:26:42 EDT Ventricular Rate:  116 PR  Interval:  146 QRS Duration:  82 QT Interval:  310 QTC Calculation: 430 R Axis:   -16  Text Interpretation: Sinus tachycardia Minimal voltage criteria for LVH, may be normal variant ( R in aVL ) Cannot rule out Anterior infarct , age undetermined Confirmed by Bruckner Shelda [phone]) on 09/15/2024 11:58:09 AM    Physical Exam:   VS:  BP 128/68   Pulse 86   Resp 17   Ht 5' 3 (1.6 m)   Wt 177 lb (80.3 kg)   SpO2 94%   BMI 31.35 kg/m    Wt Readings from Last 3 Encounters:  09/15/24 177 lb (80.3 kg)  08/09/24 181 lb (82.1 kg)  01/20/24 181 lb (82.1 kg)    GEN: Well nourished, well developed in no acute distress HEENT: Normal, moist mucous membranes NECK: No JVD CARDIAC: regular rhythm, normal S1 and S2, no rubs or gallops. No murmur. VASCULAR: Radial and DP pulses 2+ bilaterally. No carotid bruits RESPIRATORY:  Clear to auscultation without rales, wheezing or rhonchi  ABDOMEN: Soft, non-tender, non-distended MUSCULOSKELETAL:  Ambulates independently SKIN: Warm and dry, R>L bilateral pitting LE 2+edema worst at ankles NEUROLOGIC:  Alert and oriented x 3. No focal neuro deficits noted. PSYCHIATRIC:  Normal affect    ASSESSMENT AND PLAN: .    Bilateral LE edema: -stable on BID lasix , continue -suspect components of both chronic diastolic heart failure and chronic venous insufficiency -echo without high risk findings -reviewed instructions on daily weights, salt avoidance, etc -encouraged activity as tolerated -discussed SGLT2i, declines -improves with compression, elevation   Hypertension -continue valsartan  80 mg BID,  furosemide  20 mg bid -now back on amlodipine  5 mg, this dose does not affect her chronic LE edema but would not increase to 10 mg dose   Hypercholesterolemia -was on rosuvastatin  5 mg 3x/week previously, did not tolerate -tolerating ezetimibe  -she has a very elevated ASCVD risk score, though now she is 80 and outside the risk prediction model    Sinus tachycardia -HR improves with resting. Increase activity as tolerated.  CV risk counseling and prevention -we have discussed recommended food choices, she tries to make good deciscions. -mobility is limited, encouraged her to ambulate with rollator as much as she is able  Dispo: 6 mos  Signed, Shelda Bruckner, MD   Shelda Bruckner, MD, PhD, [facility]  [facility]  [place]  [facility] at [facility] [address] [place], [place] [address] [phone]

## 2024-09-15 NOTE — Patient Instructions (Signed)
 Medication Instructions:   Your physician recommends that you continue on your current medications as directed. Please refer to the Current Medication list given to you today.  *If you need a refill on your cardiac medications before your next appointment, please call your pharmacy*    Follow-Up: At [facility], you and your health needs are our priority.  As part of our continuing mission to provide you with exceptional heart care, our providers are all part of one team.  This team includes your primary Cardiologist (physician) and Advanced Practice Providers or APPs (Physician Assistants and Nurse Practitioners) who all work together to provide you with the care you need, when you need it.  Your next appointment:   6 month(s)  Provider:   Shelda Bruckner, MD, Rosaline Bane, NP, or Reche Finder, NP

## 2024-10-28 ENCOUNTER — Other Ambulatory Visit (HOSPITAL_BASED_OUTPATIENT_CLINIC_OR_DEPARTMENT_OTHER): Payer: Self-pay

## 2024-10-28 MED ORDER — COMIRNATY 30 MCG/0.3ML IM SUSY
0.3000 mL | PREFILLED_SYRINGE | Freq: Once | INTRAMUSCULAR | 0 refills | Status: AC
  Filled 2024-10-28: qty 0.3, 1d supply, fill #0

## 2024-11-01 ENCOUNTER — Other Ambulatory Visit: Payer: Self-pay | Admitting: Cardiology

## 2024-11-01 DIAGNOSIS — I10 Essential (primary) hypertension: Secondary | ICD-10-CM

## 2024-11-02 ENCOUNTER — Telehealth: Payer: Self-pay | Admitting: Cardiology

## 2024-11-02 DIAGNOSIS — I10 Essential (primary) hypertension: Secondary | ICD-10-CM

## 2024-11-02 MED ORDER — VALSARTAN 80 MG PO TABS: 80.0000 mg | ORAL_TABLET | Freq: Two times a day (BID) | ORAL | 3 refills | Status: DC

## 2024-11-02 NOTE — Telephone Encounter (Signed)
 Pt's medication was sent to pt's pharmacy as requested. Confirmation received.

## 2024-11-02 NOTE — Telephone Encounter (Signed)
*  STAT* If patient is at the pharmacy, call can be transferred to refill team.   1. Which medications need to be refilled? (please list name of each medication and dose if known)   valsartan  (DIOVAN ) 80 MG tablet    2. Which pharmacy/location (including street and city if local pharmacy) is medication to be sent to?  [id number] - [place], [place] - [address] AT [facility] [address]      3. Do they need a 30 day or 90 day supply? 90

## 2024-11-22 NOTE — Progress Notes (Incomplete)
 Annual Wellness Visit   Patient Care Team: Baxley, Ronal PARAS, MD as PCP - General (Internal Medicine) Lonni Slain, MD as PCP - Cardiology (Cardiology)  Visit Date: 11/22/24   No chief complaint on file.  Subjective:  Patient: Lisa [name], Female DOB: [birth year], 83 y.o. MRN: [id number] There were no vitals filed for this visit. Lisa [name] is a 83 y.o. Female who presents today for her Annual Wellness Visit. Patient has Hypertension; Obesity; Thyromegaly; Thyroid  nodule; Allergic rhinitis; History of cervical cancer; Hyperlipemia; History of glaucoma; Anxiety; Osteoarthritis of left knee; Multinodular goiter; Impaired glucose tolerance; Arthritis of left knee; Osteoarthritis; Primary osteoarthritis of left knee; Pain in left knee; Stiffness of left knee; Allergic rhinitis due to animal (cat) (dog) hair and dander; Cough variant asthma; Atopic dermatitis; Allergic rhinitis due to pollen; Intertrigo; Lichen simplex; Presbycusis of both ears; Seborrheic keratosis; Tinnitus of left ear; and Pain of left hip joint on their problem list.  History of Hypertension  treated with amlodipine  5 mg daily, Valsartan  80 mg daily.  History of dependent edema treated with Furosemide  20 mg.   Labs ***/***/*** {Labs (Optional):31667}   01/15/2024 Mammogram    09/18/2023 Diverticulosis in the sigmoid colon. Post- polypectomy scar in the cecum. The examination was otherwise normal on direct and retroflexion views. No specimens collected. No repeat due to age.    Health Maintenance  Topic Date Due   Zoster Vaccines- Shingrix (1 of 2) 12/08/1962   Bone Density Scan  Never done   Diabetic kidney evaluation - Urine ACR  11/26/2022   HEMOGLOBIN A1C  11/28/2022   OPHTHALMOLOGY EXAM  12/11/2023   Medicare Annual Wellness (AWV)  12/20/2023   DTaP/Tdap/Td (3 - Td or Tdap) 03/18/2024   Diabetic kidney evaluation - eGFR measurement  06/05/2024   Influenza Vaccine  07/30/2024   Mammogram   01/14/2025   FOOT EXAM  01/20/2025   COVID-19 Vaccine (12 - Mixed Product risk 2025-26 season) 04/28/2025   Colonoscopy  09/17/2028   Pneumococcal Vaccine: 50+ Years  Completed   Meningococcal B Vaccine  Aged Out    {Man or Woman:32389}  Vaccine Counseling: Due for {Vaccines:32291::Influenza}; UTD on {Vaccines:32291::Influenza}  ROS Objective:  Vitals: body mass index is unknown because there is no height or weight on file.There were no vitals filed for this visit. Physical Exam  Current Outpatient Medications  Medication Instructions   acetaminophen  (TYLENOL ) 500-1,000 mg, Every 6 hours PRN   albuterol  (VENTOLIN  HFA) 108 (90 Base) MCG/ACT inhaler 2 puffs, Inhalation, Every 6 hours PRN   amLODipine  (NORVASC ) 5 mg, Oral, Daily   Azelastine HCl 137 MCG/SPRAY SOLN    ezetimibe  (ZETIA ) 10 mg, Oral, Daily   fexofenadine (ALLEGRA) 180 MG tablet 1 tablet   fluticasone  (FLONASE ) 50 MCG/ACT nasal spray 2 sprays, Daily PRN   fluticasone  (FLOVENT  HFA) 110 MCG/ACT inhaler 2 puffs Inhalation Twice a day for 30 days   furosemide  (LASIX ) 20 mg, Oral, 2 times daily   loratadine  (CLARITIN ) 10 mg, Nightly   Spacer/Aero-Holding Chambers DEVI 1 each, Does not apply, 4 times daily   triamcinolone  ointment (KENALOG ) 0.1 %    valsartan  (DIOVAN ) 80 mg, Oral, 2 times daily   Past Medical History:  Diagnosis Date   Allergy    Arthritis    Asthma    Cancer (HCC)    cervical   Complication of anesthesia    Difficulty waking up after hysterectomy in 1973   Goiter    Headache    Sinus headaches  Hyperlipidemia    Hypertension    Obesity    Osteopenia    Vitamin D  deficiency    Medical/Surgical History Narrative:  Allergic/Intolerant to:  Allergies  Allergen Reactions   Grass Pollen(K-O-R-T-Swt Vern) Itching and Shortness Of Breath   Penicillins Other (See Comments)    Chest pain 12 years   Pollen Extract Cough, Itching and Shortness Of Breath   Codeine Other (See Comments)    Hard  time waking-up   Darvon Nausea And Vomiting   Tall Ragweed Itching and Other (See Comments)    Seasonal  Congestion/ watery eyes/ Sneezing and chest congestion, itching eyes and ears Trees Grass    *** - ***  *** - ***  *** - ***  *** - ***  *** - ***  *** - ***  *** - ***  *** - *** Other - Hx of: *** ; Surghx of: *** Past Surgical History:  Procedure Laterality Date   ABDOMINAL HYSTERECTOMY     COLONOSCOPY     DENTAL SURGERY     EYE SURGERY     HAMMER TOE SURGERY  11/2001   right   TOTAL KNEE ARTHROPLASTY Left 08/21/2020   Procedure: TOTAL KNEE ARTHROPLASTY;  Surgeon: Melodi Lerner, MD;  Location: [facility];  Service: Orthopedics;  Laterality: Left;    WISDOM TOOTH EXTRACTION     Family History  Problem Relation Age of Onset   Colon polyps Mother    Hypertension Mother    Heart disease Father    Hypertension Father    Thyroid  disease Father    Colon polyps Sister    Colon cancer Neg Hx    Esophageal cancer Neg Hx    Rectal cancer Neg Hx    Stomach cancer Neg Hx    Family History Narrative: {ELFamHX:31110} Social History   Social History Narrative   Not on file   Most Recent Health Risks Assessment:   Most Recent Social Determinants of Health (Including Hx of Tobacco, Alcohol, and Drug Use) SDOH Screenings   Food Insecurity: Low Risk  (06/17/2023)   Received from [facility]  Housing: Low Risk  (06/17/2023)   Received from [facility]  Transportation Needs: No Transportation Needs (06/17/2023)   Received from [facility]  Utilities: Low Risk  (06/17/2023)   Received from [facility]  Alcohol Screen: Low Risk  (12/19/2022)  Depression (PHQ2-9): Low Risk  (08/09/2024)  Financial Resource Strain: Low Risk  (12/19/2022)  Physical Activity: Insufficiently Active (12/19/2022)  Social Connections: Moderately Integrated (12/19/2022)  Stress: No Stress Concern Present (12/19/2022)  Tobacco Use: Low Risk  (09/15/2024)   Social History    Tobacco Use   Smoking status: Never   Smokeless tobacco: Never  Vaping Use   Vaping status: Never Used  Substance Use Topics   Alcohol use: No    Comment: Wine once or twice a year   Drug use: No   Most Recent Functional Status Assessment:     No data to display         Most Recent Fall Risk Assessment:    12/19/2022    3:01 PM  Fall Risk   Falls in the past year? 0  Number falls in past yr: 0  Injury with Fall? 0  Risk for fall due to : No Fall Risks  Follow up Falls prevention discussed      Data saved with a previous flowsheet row definition   Most Recent Anxiety/Depression Screenings:    08/09/2024  2:22 PM 12/19/2022    3:02 PM  PHQ 2/9 Scores  PHQ - 2 Score 0 0       No data to display         Most Recent Cognitive Screening:    11/26/2021   11:19 AM  6CIT Screen  What Year? 0 points  What month? 0 points  What time? 0 points  Count back from 20 0 points  Months in reverse 0 points  Repeat phrase 0 points  Total Score 0 points   Most Recent Vision/Hearing Screenings:No results found. Results:  Studies Obtained And Personally Reviewed By Me: Diabetic Foot Exam - Simple   No data filed     {Imaging, colonoscopy, mammogram, bone density scan, echocardiogram, heart cath, stress test, CT calcium  score, etc.:32292}  Labs:  CBC w/ Differential Lab Results  Component Value Date   WBC 7.6 08/09/2024   RBC 5.28 (H) 08/09/2024   HGB 13.4 08/09/2024   HCT 43.0 08/09/2024   PLT 223 08/09/2024   MCV 81.4 08/09/2024   MCH 25.4 (L) 08/09/2024   MCHC 31.2 (L) 08/09/2024   RDW 15.2 (H) 08/09/2024   MPV 9.8 08/09/2024   LYMPHSABS 960 11/20/2021   MONOABS 294 01/14/2017   BASOSABS 30 08/09/2024    Comprehensive Metabolic Panel Lab Results  Component Value Date   NA 142 06/06/2023   K 4.2 06/06/2023   CL 106 06/06/2023   CO2 22 06/06/2023   GLUCOSE 104 (H) 06/06/2023   BUN 25 06/06/2023   CREATININE 1.03 (H) 06/06/2023   CALCIUM  9.7  06/06/2023   PROT 7.2 06/06/2023   ALBUMIN 4.3 06/06/2023   AST 22 06/06/2023   ALT 17 06/06/2023   ALKPHOS 119 06/06/2023   BILITOT 0.7 06/06/2023   EGFR 55 (L) 06/06/2023   GFRNONAA 49 (L) 11/16/2020   Lipid Panel  Lab Results  Component Value Date   CHOL 173 06/06/2023   HDL 72 06/06/2023   LDLCALC 92 06/06/2023   TRIG 44 06/06/2023   A1c Lab Results  Component Value Date   HGBA1C 5.8 (H) 05/28/2022    TSH Lab Results  Component Value Date   TSH 1.14 11/20/2021   PSA{PSA (Optional):32132} No results found for any visits on 12/03/24. Assessment & Plan:  No orders of the defined types were placed in this encounter.  No orders of the defined types were placed in this encounter.  Other Labs Reviewed today:    No follow-ups on file.   Annual Wellness Visit done today including the all of the following: Reviewed patient's Family Medical History Reviewed patient's SDOH and reviewed tobacco, alcohol, and drug use.  Reviewed and updated list of patient's medical providers Assessment of cognitive impairment was done Assessed patient's functional ability Established a written schedule for health screening services Health Risk Assessent Completed and Reviewed  Discussed health benefits of physical activity, and encouraged her to engage in regular exercise appropriate for her age and condition.   I,Makayla C Reid,acting as a scribe for Ronal JINNY Hailstone, MD.,have documented all relevant documentation on the behalf of Ronal JINNY Hailstone, MD,as directed by  Ronal JINNY Hailstone, MD while in the presence of Ronal JINNY Hailstone, MD.   I, Ronal JINNY Hailstone, MD, have reviewed all documentation for and agree with the above Annual Wellness Visit documentation.  Ronal JINNY Hailstone, MD Internal Medicine 12/03/2024

## 2024-11-30 ENCOUNTER — Other Ambulatory Visit (HOSPITAL_BASED_OUTPATIENT_CLINIC_OR_DEPARTMENT_OTHER): Payer: Self-pay

## 2024-11-30 ENCOUNTER — Other Ambulatory Visit

## 2024-11-30 DIAGNOSIS — E7800 Pure hypercholesterolemia, unspecified: Secondary | ICD-10-CM

## 2024-11-30 DIAGNOSIS — Z1329 Encounter for screening for other suspected endocrine disorder: Secondary | ICD-10-CM

## 2024-11-30 DIAGNOSIS — J4520 Mild intermittent asthma, uncomplicated: Secondary | ICD-10-CM

## 2024-11-30 DIAGNOSIS — R7302 Impaired glucose tolerance (oral): Secondary | ICD-10-CM

## 2024-11-30 DIAGNOSIS — I10 Essential (primary) hypertension: Secondary | ICD-10-CM

## 2024-11-30 DIAGNOSIS — Z0000 Encounter for general adult medical examination without abnormal findings: Secondary | ICD-10-CM

## 2024-11-30 MED ORDER — FLUZONE HIGH-DOSE 0.5 ML IM SUSY
0.5000 mL | PREFILLED_SYRINGE | Freq: Once | INTRAMUSCULAR | 0 refills | Status: AC
  Filled 2024-11-30: qty 0.5, 1d supply, fill #0

## 2024-12-01 LAB — COMPREHENSIVE METABOLIC PANEL WITH GFR
AG Ratio: 1.6 (calc) (ref 1.0–2.5)
ALT: 9 U/L (ref 6–29)
AST: 19 U/L (ref 10–35)
Albumin: 4.7 g/dL (ref 3.6–5.1)
Alkaline phosphatase (APISO): 89 U/L (ref 37–153)
BUN: 23 mg/dL (ref 7–25)
CO2: 27 mmol/L (ref 20–32)
Calcium: 9.9 mg/dL (ref 8.6–10.4)
Chloride: 105 mmol/L (ref 98–110)
Creat: 0.91 mg/dL (ref 0.60–0.95)
Globulin: 2.9 g/dL (ref 1.9–3.7)
Glucose, Bld: 117 mg/dL — ABNORMAL HIGH (ref 65–99)
Potassium: 4.6 mmol/L (ref 3.5–5.3)
Sodium: 143 mmol/L (ref 135–146)
Total Bilirubin: 1 mg/dL (ref 0.2–1.2)
Total Protein: 7.6 g/dL (ref 6.1–8.1)
eGFR: 64 mL/min/1.73m2 (ref >=60)

## 2024-12-01 LAB — CBC WITH DIFFERENTIAL/PLATELET
Absolute Lymphocytes: 737 {cells}/uL — ABNORMAL LOW (ref 850–3900)
Absolute Monocytes: 281 {cells}/uL (ref 200–950)
Basophils Absolute: 22 {cells}/uL (ref 0–200)
Basophils Relative: 0.4 %
Eosinophils Absolute: 0 {cells}/uL — ABNORMAL LOW (ref 15–500)
Eosinophils Relative: 0 %
HCT: 42.5 % (ref 35.9–46.0)
Hemoglobin: 13.5 g/dL (ref 11.7–15.5)
MCH: 25.5 pg — ABNORMAL LOW (ref 27.0–33.0)
MCHC: 31.8 g/dL (ref 31.6–35.4)
MCV: 80.2 fL — ABNORMAL LOW (ref 81.4–101.7)
MPV: 10.3 fL (ref 7.5–12.5)
Monocytes Relative: 5.1 %
Neutro Abs: 4461 {cells}/uL (ref 1500–7800)
Neutrophils Relative %: 81.1 %
Platelets: 195 Thousand/uL (ref 140–400)
RBC: 5.3 Million/uL — ABNORMAL HIGH (ref 3.80–5.10)
RDW: 15.6 % — ABNORMAL HIGH (ref 11.0–15.0)
Total Lymphocyte: 13.4 %
WBC: 5.5 Thousand/uL (ref 3.8–10.8)

## 2024-12-01 LAB — LIPID PANEL
Cholesterol: 174 mg/dL (ref <200)
HDL: 74 mg/dL (ref >=50)
LDL Cholesterol (Calc): 88 mg/dL
Non-HDL Cholesterol (Calc): 100 mg/dL (ref <130)
Total CHOL/HDL Ratio: 2.4 (calc) (ref <5.0)
Triglycerides: 41 mg/dL (ref <150)

## 2024-12-01 LAB — HEMOGLOBIN A1C
Hgb A1c MFr Bld: 5.8 % — ABNORMAL HIGH (ref <5.7)
Mean Plasma Glucose: 120 mg/dL
eAG (mmol/L): 6.6 mmol/L

## 2024-12-01 LAB — TSH: TSH: 0.83 m[IU]/L (ref 0.40–4.50)

## 2024-12-02 ENCOUNTER — Encounter: Payer: Self-pay | Admitting: Podiatry

## 2024-12-02 ENCOUNTER — Ambulatory Visit: Admitting: Podiatry

## 2024-12-02 VITALS — Ht 63.0 in | Wt 177.0 lb

## 2024-12-02 DIAGNOSIS — M79674 Pain in right toe(s): Secondary | ICD-10-CM

## 2024-12-02 DIAGNOSIS — M79675 Pain in left toe(s): Secondary | ICD-10-CM | POA: Diagnosis not present

## 2024-12-02 DIAGNOSIS — B351 Tinea unguium: Secondary | ICD-10-CM

## 2024-12-03 ENCOUNTER — Ambulatory Visit: Admitting: Internal Medicine

## 2024-12-05 NOTE — Progress Notes (Signed)
 Subjective:   [name], female   DOB: 83 y.o.   MRN: [id number]   HPI Patient presents with elongated thickened nailbeds 1-5 both feet dystrophic painful   ROS      Objective:  Physical Exam  Neurovascular status intact with thick yellow brittle nailbeds 1-5 both feet that are painful when pressed     Assessment:  Mycotic nail infection with pain 1-5 both feet     Plan:  Debridement nailbeds 1-5 both feet no iatrogenic bleeding reappoint routine care

## 2024-12-09 ENCOUNTER — Ambulatory Visit: Admitting: Internal Medicine

## 2024-12-10 ENCOUNTER — Ambulatory Visit: Admitting: Internal Medicine

## 2024-12-10 ENCOUNTER — Encounter: Payer: Self-pay | Admitting: Internal Medicine

## 2024-12-10 VITALS — BP 130/78 | HR 118 | Ht 63.0 in | Wt 174.0 lb

## 2024-12-10 DIAGNOSIS — R600 Localized edema: Secondary | ICD-10-CM

## 2024-12-10 DIAGNOSIS — I10 Essential (primary) hypertension: Secondary | ICD-10-CM

## 2024-12-10 DIAGNOSIS — Z0000 Encounter for general adult medical examination without abnormal findings: Secondary | ICD-10-CM | POA: Diagnosis not present

## 2024-12-10 DIAGNOSIS — E7800 Pure hypercholesterolemia, unspecified: Secondary | ICD-10-CM

## 2024-12-10 DIAGNOSIS — R7302 Impaired glucose tolerance (oral): Secondary | ICD-10-CM

## 2024-12-10 DIAGNOSIS — R609 Edema, unspecified: Secondary | ICD-10-CM

## 2024-12-10 NOTE — Progress Notes (Addendum)
 "    Patient Care Team: Perri Ronal PARAS, MD as PCP - General (Internal Medicine) Lonni Slain, MD as PCP - Cardiology (Cardiology)  Visit Date: 12/13/2024   Chief Complaint  Patient presents with   Annual Exam   Subjective:  Patient: Lisa [name], Female DOB: November 19, 1941, 83 y.o. MRN: [id number] Vitals:   12/10/24 1509 12/10/24 1531  BP: (!) 160/80 130/78   Lisa AAMODT is a 83 y.o. Female who presents today for her 43-month office visit. Patient has Hypertension; Obesity; Thyromegaly; Thyroid  Nodule; History Of Cervical Cancer; Hyperlipemia; History Of Glaucoma; Anxiety; Osteoarthritis Of Left Knee; Multinodular Goiter; Impaired Glucose Tolerance; Arthritis Of Left Knee; Osteoarthritis; Primary Osteoarthritis Of Left Knee; Allergic Rhinitis Due To Animal (Cat) (Dog) Hair and Dander; Cough Variant Asthma; Atopic Dermatitis; Allergic Rhinitis Due To Pollen; Intertrigo; Lichen Simplex; Presbycusis, Both Ears; Seborrheic Keratosis; and Tinnitus, Left Ear.  She requests that her Vitamin-D and magnesium be checked. She also mentions a recent incident where she burned her left ear while at the hair dresser.   History of Hypertension treated with Amlodipine  5 mg daily and Valsartan  80 mg daily. Blood pressure elevated today at 160/80; she notes that she walked here and was aggravated when it was checked, but at home her blood pressures are normal; on recheck blood pressure normalized to 130/78.  History of Dependent Edema treated with Furosemide  20 mg twice daily.  History of Impaired Glucose Tolerance managed with diet and exercise.  11/30/2024 HgbA1c 5.8, stable from 2023 and Glucose 117, elevated from 103 in 05/2023. Eye exam 02/2024 with repeat due in 2026. Since 2024, when she weighed 181 lbs, she has lost 7 pounds and today weighs 174 lbsBMI 30.82.  Labs 11/30/2024 CBC w/ Differential, compared to 08/09/2024: RBC 5.30, elevated from 5.28; MCV 80.2, decreased from 81.4, MCH  25.5, decreased from 25.4, RDW 15.6, elevated from 15.2, Absolute Lymphocytes 737, decreased from 1056, and Absolute Eosinophils 0, decreased from 23. Comprehensive Metabolic Panel, compared to 06/06/2023: Glucose 117, elevated from 104; otherwise WNL. Her lipid panel was normal.  TSH was normal at 0.83.    History of Hysterectomy w/o Oophorectomy in [place]  in 1977 due to Cervical Cancer.   01/15/2024 Mammogram normal with repeat 2026.   History of large colon polyp removed at [facility] in [place]  that was benign for a number of years ago. Tubular adenoma on colonoscopy 2019 by Dr. Celestia.  Colonoscopy 09/18/2023 Diverticulosis in the sigmoid colon. Post- polypectomy scar in the cecum. The examination was otherwise normal on direct and retroflexion views. No specimens collected. No repeat due to age.   Bone Density 2008.    Vaccine Counseling: Due for Shingrix 1st dose, Shingrix 2nd dose, and Tdap; UTD on Influenza, Covid-19, and PNA. Tdap and Shingrix - has to get at pharmacy. Get one shot at a time. Wait until after Flu season (March/April).  Review of Systems  All other systems reviewed and are negative.  Objective:  Vitals: body mass index is 30.82 kg/m. Today's Vitals   12/10/24 1509 12/10/24 1531  BP: (!) 160/80 130/78  Pulse: (!) 118   SpO2: 93%   Weight: 174 lb (78.9 kg)   Height: 5' 3 (1.6 m)   PainSc: 0-No pain    Physical Exam Vitals and nursing note reviewed.  Constitutional:      General: She is not in acute distress.    Appearance: Normal appearance. She is not ill-appearing or toxic-appearing.  HENT:     Head:  Normocephalic and atraumatic.     Right Ear: Hearing, tympanic membrane, ear canal and external ear normal.     Left Ear: Hearing, tympanic membrane and ear canal normal.     Ears:     Comments: Visualized healing 2nd degree burn on top of external left ear. No sign of infection.    Mouth/Throat:     Pharynx: Oropharynx is  clear.  Eyes:     Extraocular Movements: Extraocular movements intact.     Pupils: Pupils are equal, round, and reactive to light.  Neck:     Thyroid : No thyroid  mass, thyromegaly or thyroid  tenderness.     Vascular: No carotid bruit.  Cardiovascular:     Rate and Rhythm: Normal rate and regular rhythm. No extrasystoles are present.    Pulses:          Dorsalis pedis pulses are 2+ on the right side and 2+ on the left side.     Heart sounds: Normal heart sounds. No murmur heard.    No friction rub. No gallop.  Pulmonary:     Effort: Pulmonary effort is normal.     Breath sounds: Normal breath sounds. No decreased breath sounds, wheezing, rhonchi or rales.  Chest:     Chest wall: No mass.  Abdominal:     Palpations: Abdomen is soft. There is no hepatomegaly, splenomegaly or mass.     Tenderness: There is no abdominal tenderness.     Hernia: A hernia is present. Hernia is present in the umbilical area.  Musculoskeletal:     Cervical back: Normal range of motion.     Right lower leg: No edema.     Left lower leg: No edema.  Lymphadenopathy:     Cervical: No cervical adenopathy.     Upper Body:     Right upper body: No supraclavicular adenopathy.     Left upper body: No supraclavicular adenopathy.  Skin:    General: Skin is warm and dry.  Neurological:     General: No focal deficit present.     Mental Status: She is alert and oriented to person, place, and time. Mental status is at baseline.     Sensory: Sensation is intact.     Motor: Motor function is intact. No weakness.     Deep Tendon Reflexes: Reflexes are normal and symmetric.  Psychiatric:        Attention and Perception: Attention normal.        Mood and Affect: Mood normal.        Speech: Speech normal.        Behavior: Behavior normal.        Thought Content: Thought content normal.        Cognition and Memory: Cognition normal.        Judgment: Judgment normal.    Current Outpatient Medications  Medication  Instructions   acetaminophen  (TYLENOL ) 500-1,000 mg, Every 6 hours PRN   albuterol  (VENTOLIN  HFA) 108 (90 Base) MCG/ACT inhaler 2 puffs, Inhalation, Every 6 hours PRN   amLODipine  (NORVASC ) 5 mg, Oral, Daily   Azelastine HCl 137 MCG/SPRAY SOLN    ezetimibe  (ZETIA ) 10 mg, Oral, Daily   fexofenadine (ALLEGRA) 180 MG tablet 1 tablet   fluticasone  (FLONASE ) 50 MCG/ACT nasal spray 2 sprays, Daily PRN   fluticasone  (FLOVENT  HFA) 110 MCG/ACT inhaler 2 puffs Inhalation Twice a day for 30 days   furosemide  (LASIX ) 20 mg, Oral, 2 times daily   loratadine  (CLARITIN ) 10 mg, Nightly  Spacer/Aero-Holding Chambers DEVI 1 each, Does not apply, 4 times daily   triamcinolone  ointment (KENALOG ) 0.1 %    valsartan  (DIOVAN ) 80 mg, Oral, 2 times daily   Past Medical History:  Diagnosis Date   Allergy    Arthritis    Asthma    Cancer (HCC)    cervical   Complication of anesthesia    Difficulty waking up after hysterectomy in 1973   Goiter    Headache    Sinus headaches   Hyperlipidemia    Hypertension    Obesity    Osteopenia    Vitamin D  deficiency    Medical/Surgical History Narrative:  Allergic/Intolerant to:  Allergies  Allergen Reactions   Grass Pollen(K-O-R-T-Swt Vern) Itching and Shortness Of Breath   Penicillins Other (See Comments)    Chest pain 12 years   Pollen Extract Cough, Itching and Shortness Of Breath   Codeine Other (See Comments)    Hard time waking-up   Darvon Nausea And Vomiting   Tall Ragweed Itching and Other (See Comments)    Seasonal  Congestion/ watery eyes/ Sneezing and chest congestion, itching eyes and ears Trees Grass    Social history: She is a widow.  Husband passed away of complications of COVID-19 in the nursing home in 2021.  No children.    2021 - total left knee arthroplasty by Dr. Hiram in August.  2012 - ultrasound-guided needle biopsy aspirate of thyroid  gland was benign.  2002 - right hammertoe surgery  Other - Hx of: Multinodular Goiter,  Allergic Rhinitis, Hemorrhoids and Anal Itching. Past Surgical History:  Procedure Laterality Date   ABDOMINAL HYSTERECTOMY     COLONOSCOPY     DENTAL SURGERY     EYE SURGERY     HAMMER TOE SURGERY  11/2001   right   TOTAL KNEE ARTHROPLASTY Left 08/21/2020   Procedure: TOTAL KNEE ARTHROPLASTY;  Surgeon: Melodi Lerner, MD;  Location: [facility];  Service: Orthopedics;  Laterality: Left;    WISDOM TOOTH EXTRACTION     Family History  Problem Relation Age of Onset   Colon polyps Mother    Hypertension Mother    Heart disease Father    Hypertension Father    Thyroid  disease Father    Colon polyps Sister    Colon cancer Neg Hx    Esophageal cancer Neg Hx    Rectal cancer Neg Hx    Stomach cancer Neg Hx    Family History Narrative: No Family History of GI Cancers Father, deceased, w/ hx of Diabetes and Hypertension Mother, deceased, w/ hx of Colon Polyps, Hypertension, Dementia, and Kidney Failure Siblings, 1 Sister is reportedly healthy.   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  Sister lives here in [place] as well in a separate residence.   Most Recent Social Determinants of Health (Including Hx of Tobacco, Alcohol, and Drug Use) SDOH Screenings   Food Insecurity: Low Risk (06/17/2023)   Received from [facility]  Housing: Low Risk (06/17/2023)   Received from [facility]  Transportation Needs: No Transportation Needs (06/17/2023)   Received from [facility]  Utilities: Low Risk (06/17/2023)   Received from [facility]  Alcohol Screen: Low Risk (12/19/2022)  Depression (PHQ2-9): Low Risk (08/09/2024)  Financial Resource Strain: Low Risk (12/19/2022)  Physical Activity: Insufficiently Active (12/19/2022)  Social Connections: Moderately Integrated (12/19/2022)  Stress: No Stress Concern Present (12/19/2022)  Tobacco Use: Low Risk (12/10/2024)   Social History   Tobacco Use    Smoking status: Never   Smokeless tobacco: Never  Vaping Use   Vaping status: Never Used  Substance Use Topics   Alcohol use: No    Comment: Wine once or twice a year   Drug use: No   Most Recent Fall Risk Assessment:    12/10/2024    3:08 PM  Fall Risk   Falls in the past year? 0  Number falls in past yr: 0  Injury with Fall? 0  Risk for fall due to : No Fall Risks  Follow up Falls evaluation completed;Education provided;Falls prevention discussed   Most Recent Anxiety/Depression Screenings:    08/09/2024    2:22 PM 12/19/2022    3:02 PM  PHQ 2/9 Scores  PHQ - 2 Score 0 0   Most Recent Cognitive Screening:    11/26/2021   11:19 AM  6CIT Screen  What Year? 0 points  What month? 0 points  What time? 0 points  Count back from 20 0 points  Months in reverse 0 points  Repeat phrase 0 points  Total Score 0 points   Most Recent Vision/Hearing Screenings:No results found. Results:  Studies Obtained And Personally Reviewed By Me:  01/15/2024 Mammogram normal with repeat 2026.   Colonoscopy 09/18/2023 Diverticulosis in the sigmoid colon. Post- polypectomy scar in the cecum. The examination was otherwise normal on direct and retroflexion views. No specimens collected. No repeat due to age.   Labs:  CBC w/ Differential Lab Results  Component Value Date   WBC 5.5 11/30/2024   RBC 5.30 (H) 11/30/2024   HGB 13.5 11/30/2024   HCT 42.5 11/30/2024   PLT 195 11/30/2024   MCV 80.2 (L) 11/30/2024   MCH 25.5 (L) 11/30/2024   MCHC 31.8 11/30/2024   RDW 15.6 (H) 11/30/2024   MPV 10.3 11/30/2024   LYMPHSABS 960 11/20/2021   MONOABS 294 01/14/2017   BASOSABS 22 11/30/2024    Comprehensive Metabolic Panel Lab Results  Component Value Date   NA 143 11/30/2024   K 4.6 11/30/2024   CL 105 11/30/2024   CO2 27 11/30/2024   GLUCOSE 117 (H) 11/30/2024   BUN 23 11/30/2024   CREATININE 0.91 11/30/2024   CALCIUM  9.9 11/30/2024   PROT 7.6 11/30/2024   ALBUMIN 4.3  06/06/2023   AST 19 11/30/2024   ALT 9 11/30/2024   ALKPHOS 119 06/06/2023   BILITOT 1.0 11/30/2024   EGFR 64 11/30/2024   GFRNONAA 49 (L) 11/16/2020   Lipid Panel  Lab Results  Component Value Date   CHOL 174 11/30/2024   HDL 74 11/30/2024   LDLCALC 88 11/30/2024   TRIG 41 11/30/2024   A1c Lab Results  Component Value Date   HGBA1C 5.8 (H) 11/30/2024    TSH Lab Results  Component Value Date   TSH 0.83 11/30/2024   No results found for any visits on 12/10/24. Assessment & Plan:  Other Labs Reviewed today: CBC w/ Differential, compared to 08/09/2024: RBC 5.30, elevated from 5.28; MCV 80.2, decreased from 81.4, MCH 25.5, decreased from 25.4, RDW 15.6, elevated from 15.2, Absolute Lymphocytes 737, decreased from 1056, and Absolute Eosinophils 0, decreased from 23. Comprehensive Metabolic Panel, compared to 06/06/2023: Glucose 117, elevated from 104; otherwise WNL.  Her lipid panel was normal.  TSH was normal at 0.83.   Hypertension treated with Amlodipine  5 mg daily and Valsartan  80 mg daily. Blood pressure elevated today at 160/80; she  notes that she walked here and was aggravated when it was checked, but at home her blood pressures are normal; on recheck blood pressure normalized to 130/78. Continue Amlodipine  5 mg daily and Valsartan  80 mg daily.     Dependent Edema treated with Furosemide  20 mg twice daily.  Continue Furosemide  20 mg twice daily.   Impaired Glucose Tolerance managed with diet and exercise.  11/30/2024 HgbA1c 5.8, stable from 2023 and Glucose 117, elevated from 103 in 05/2023. Eye exam 02/2024 with repeat due in 2026. Since 2024, when she weighed 181 lbs, she has lost 7 pounds and today weighs 174 lbsBMI 30.82.  Hemoglobin A1c slightly elevated above goal. Continue diet and exercise. Repeat eye exam as scheduled.   Health Maintenance Hysterectomy w/o Oophorectomy in [place]  in 1977 due to Cervical Cancer.   01/15/2024 Mammogram normal with repeat 2026.    Colonoscopy 09/18/2023 Diverticulosis in the sigmoid colon. Post- polypectomy scar in the cecum. The examination was otherwise normal on direct and retroflexion views. No specimens collected. No repeat due to age.   Bone Density 2008.    Vaccine Counseling: Due for Shingrix 1st dose, Shingrix 2nd dose, and Tdap; UTD on Influenza, Covid-19, and PNA.  Informed her where she can receive her Tdap and Shingrix vaccinations, at her local pharmacy. Recommended receiving one vaccine at a time. Suggested waiting until March/April 2026.  RTC in one year or as needed.    30-month office visit done today including the all of the following: Reviewed patient's Family Medical History Reviewed patient's SDOH and reviewed tobacco, alcohol, and drug use.  Reviewed and updated list of patient's medical providers Assessment of cognitive impairment was done Assessed patient's functional ability Established a written schedule for health screening services Health Risk Assessent Completed and Reviewed  Discussed health benefits of physical activity, and encouraged her to engage in regular exercise appropriate for her age and condition.   I, Emily Lagle, acting as a scribe for Ronal JINNY Hailstone, MD.,have documented all relevant documentation on the behalf of Ronal JINNY Hailstone, MD,as directed by  Ronal JINNY Hailstone, MD while in the presence of Ronal JINNY Hailstone, MD.   I, Ronal JINNY Hailstone, MD, have reviewed all documentation for and agree with the above Annual Wellness Visit documentation.  Ronal JINNY Hailstone, MD Internal Medicine 12/10/2024 "

## 2024-12-10 NOTE — Patient Instructions (Addendum)
 Wait until after Flu season for second Zostavax vaccine. Labs are stable and were reviewed. Continue current medications and return in one year or as needed.

## 2024-12-13 ENCOUNTER — Encounter: Payer: Self-pay | Admitting: Internal Medicine

## 2024-12-25 ENCOUNTER — Other Ambulatory Visit: Payer: Self-pay | Admitting: Cardiology

## 2024-12-25 DIAGNOSIS — I10 Essential (primary) hypertension: Secondary | ICD-10-CM

## 2024-12-26 ENCOUNTER — Encounter: Payer: Self-pay | Admitting: Internal Medicine

## 2025-01-04 ENCOUNTER — Telehealth: Payer: Self-pay | Admitting: Internal Medicine

## 2025-01-06 NOTE — Telephone Encounter (Signed)
 Done

## 2025-01-12 ENCOUNTER — Other Ambulatory Visit (HOSPITAL_BASED_OUTPATIENT_CLINIC_OR_DEPARTMENT_OTHER): Payer: Self-pay | Admitting: Cardiology

## 2025-01-12 DIAGNOSIS — I10 Essential (primary) hypertension: Secondary | ICD-10-CM

## 2025-01-31 ENCOUNTER — Ambulatory Visit: Payer: Self-pay

## 2025-02-01 ENCOUNTER — Ambulatory Visit
Admission: RE | Admit: 2025-02-01 | Discharge: 2025-02-01 | Disposition: A | Source: Ambulatory Visit | Attending: Internal Medicine | Admitting: Internal Medicine

## 2025-02-01 ENCOUNTER — Encounter: Payer: Self-pay | Admitting: Internal Medicine

## 2025-02-01 ENCOUNTER — Ambulatory Visit: Admitting: Internal Medicine

## 2025-02-01 ENCOUNTER — Ambulatory Visit: Payer: Self-pay | Admitting: Internal Medicine

## 2025-02-01 VITALS — BP 130/70 | HR 114 | Temp 99.2°F | Ht 63.0 in | Wt 174.0 lb

## 2025-02-01 DIAGNOSIS — E7800 Pure hypercholesterolemia, unspecified: Secondary | ICD-10-CM

## 2025-02-01 DIAGNOSIS — R059 Cough, unspecified: Secondary | ICD-10-CM

## 2025-02-01 DIAGNOSIS — R7302 Impaired glucose tolerance (oral): Secondary | ICD-10-CM

## 2025-02-01 DIAGNOSIS — J45909 Unspecified asthma, uncomplicated: Secondary | ICD-10-CM

## 2025-02-01 DIAGNOSIS — J22 Unspecified acute lower respiratory infection: Secondary | ICD-10-CM

## 2025-02-01 DIAGNOSIS — I10 Essential (primary) hypertension: Secondary | ICD-10-CM

## 2025-02-01 DIAGNOSIS — R051 Acute cough: Secondary | ICD-10-CM

## 2025-02-01 DIAGNOSIS — J45991 Cough variant asthma: Secondary | ICD-10-CM

## 2025-02-01 DIAGNOSIS — R062 Wheezing: Secondary | ICD-10-CM

## 2025-02-01 MED ORDER — BENZONATATE 100 MG PO CAPS: 100.0000 mg | ORAL_CAPSULE | Freq: Three times a day (TID) | ORAL | 0 refills | Status: AC | PRN

## 2025-02-01 MED ORDER — PREDNISONE 20 MG PO TABS: ORAL_TABLET | ORAL | 0 refills | Status: AC

## 2025-02-01 MED ORDER — CEFTRIAXONE SODIUM 1 G IJ SOLR
1.0000 g | Freq: Once | INTRAMUSCULAR | Status: AC
  Administered 2025-02-01: 1 g via INTRAMUSCULAR

## 2025-02-01 MED ORDER — AZITHROMYCIN 250 MG PO TABS: ORAL_TABLET | ORAL | 0 refills | Status: AC

## 2025-02-01 MED ORDER — ALBUTEROL SULFATE HFA 108 (90 BASE) MCG/ACT IN AERS: 2.0000 | INHALATION_SPRAY | Freq: Four times a day (QID) | RESPIRATORY_TRACT | 0 refills | Status: AC | PRN

## 2025-02-02 NOTE — Patient Instructions (Addendum)
 Patient has been diagnosed with an acute lower respiratory infection.  Was given 1 g IM Rocephin  in the office which she tolerated well.  Chest x-ray was ordered showing no pneumonia.  She was notified of result.  Is to take at her request Deltasone  20 mg and tapering course which she feels works well for her.  She will start with 6 tablets on day 1 and decrease by 1 tablet daily.  Tessalon  Perles 100 mg 3 times daily as needed for cough prescribed.  Take azithromycin  250 mg 2 tablets on day 1 followed by 1 tablet days 2 through 5.  Call if not improving in 48 hours or sooner if worse.

## 2025-03-02 ENCOUNTER — Ambulatory Visit: Admitting: Podiatry

## 2025-03-17 ENCOUNTER — Ambulatory Visit (HOSPITAL_BASED_OUTPATIENT_CLINIC_OR_DEPARTMENT_OTHER): Admitting: Cardiology
